# Patient Record
Sex: Female | Born: 1945 | Hispanic: No | Marital: Married | Smoking: Never smoker
Health system: Southern US, Community
[De-identification: ages and names within clinical notes are randomized; demographics above are authoritative.]

## PROBLEM LIST (undated history)

## (undated) DIAGNOSIS — N200 Calculus of kidney: Secondary | ICD-10-CM

## (undated) DIAGNOSIS — I10 Essential (primary) hypertension: Secondary | ICD-10-CM

## (undated) DIAGNOSIS — H269 Unspecified cataract: Secondary | ICD-10-CM

## (undated) DIAGNOSIS — E079 Disorder of thyroid, unspecified: Secondary | ICD-10-CM

## (undated) DIAGNOSIS — R011 Cardiac murmur, unspecified: Secondary | ICD-10-CM

## (undated) DIAGNOSIS — N189 Chronic kidney disease, unspecified: Secondary | ICD-10-CM

## (undated) HISTORY — DX: Essential (primary) hypertension: I10

## (undated) HISTORY — DX: Disorder of thyroid, unspecified: E07.9

## (undated) HISTORY — DX: Chronic kidney disease, unspecified: N18.9

## (undated) HISTORY — DX: Unspecified cataract: H26.9

## (undated) HISTORY — DX: Cardiac murmur, unspecified: R01.1

## (undated) HISTORY — PX: TUBAL LIGATION: SHX77

## (undated) HISTORY — DX: Calculus of kidney: N20.0

---

## 1997-08-14 ENCOUNTER — Other Ambulatory Visit: Admission: RE | Admit: 1997-08-14 | Discharge: 1997-08-14 | Payer: Self-pay | Admitting: Family Medicine

## 1997-08-27 ENCOUNTER — Inpatient Hospital Stay (HOSPITAL_COMMUNITY): Admission: EM | Admit: 1997-08-27 | Discharge: 1997-09-08 | Payer: Self-pay | Admitting: *Deleted

## 1997-09-03 ENCOUNTER — Encounter: Payer: Self-pay | Admitting: *Deleted

## 1997-09-26 ENCOUNTER — Encounter: Admission: RE | Admit: 1997-09-26 | Discharge: 1997-09-26 | Payer: Self-pay | Admitting: Internal Medicine

## 1998-01-17 HISTORY — PX: KIDNEY SURGERY: SHX687

## 1998-05-09 ENCOUNTER — Other Ambulatory Visit: Admission: RE | Admit: 1998-05-09 | Discharge: 1998-05-09 | Payer: Self-pay | Admitting: Obstetrics and Gynecology

## 1998-05-21 ENCOUNTER — Other Ambulatory Visit: Admission: RE | Admit: 1998-05-21 | Discharge: 1998-05-21 | Payer: Self-pay | Admitting: Obstetrics and Gynecology

## 1999-01-18 HISTORY — PX: NEPHRECTOMY: SHX65

## 1999-04-12 ENCOUNTER — Ambulatory Visit (HOSPITAL_COMMUNITY): Admission: RE | Admit: 1999-04-12 | Discharge: 1999-04-12 | Payer: Self-pay

## 1999-04-12 ENCOUNTER — Encounter (INDEPENDENT_AMBULATORY_CARE_PROVIDER_SITE_OTHER): Payer: Self-pay | Admitting: *Deleted

## 1999-05-03 ENCOUNTER — Observation Stay (HOSPITAL_COMMUNITY): Admission: RE | Admit: 1999-05-03 | Discharge: 1999-05-05 | Payer: Self-pay

## 1999-05-03 ENCOUNTER — Encounter (INDEPENDENT_AMBULATORY_CARE_PROVIDER_SITE_OTHER): Payer: Self-pay

## 2000-08-29 ENCOUNTER — Encounter: Admission: RE | Admit: 2000-08-29 | Discharge: 2000-08-29 | Payer: Self-pay | Admitting: Urology

## 2000-08-29 ENCOUNTER — Encounter: Payer: Self-pay | Admitting: Urology

## 2002-01-04 ENCOUNTER — Ambulatory Visit (HOSPITAL_COMMUNITY): Admission: RE | Admit: 2002-01-04 | Discharge: 2002-01-04 | Payer: Self-pay | Admitting: Gastroenterology

## 2002-01-04 ENCOUNTER — Encounter (INDEPENDENT_AMBULATORY_CARE_PROVIDER_SITE_OTHER): Payer: Self-pay | Admitting: Specialist

## 2002-01-17 HISTORY — PX: THYROIDECTOMY: SHX17

## 2006-03-15 ENCOUNTER — Emergency Department (HOSPITAL_COMMUNITY): Admission: EM | Admit: 2006-03-15 | Discharge: 2006-03-15 | Payer: Self-pay | Admitting: Emergency Medicine

## 2006-08-03 ENCOUNTER — Ambulatory Visit: Payer: Self-pay | Admitting: Obstetrics and Gynecology

## 2011-03-13 ENCOUNTER — Other Ambulatory Visit: Payer: Self-pay | Admitting: Family Medicine

## 2011-03-13 ENCOUNTER — Ambulatory Visit: Payer: Self-pay | Admitting: Family Medicine

## 2011-03-13 VITALS — BP 144/75 | HR 63 | Temp 98.3°F | Resp 18 | Ht 62.0 in | Wt 160.0 lb

## 2011-03-13 DIAGNOSIS — E039 Hypothyroidism, unspecified: Secondary | ICD-10-CM

## 2011-03-13 DIAGNOSIS — J45909 Unspecified asthma, uncomplicated: Secondary | ICD-10-CM

## 2011-03-13 DIAGNOSIS — I10 Essential (primary) hypertension: Secondary | ICD-10-CM

## 2011-03-13 DIAGNOSIS — Z87442 Personal history of urinary calculi: Secondary | ICD-10-CM

## 2011-03-13 MED ORDER — ALBUTEROL SULFATE HFA 108 (90 BASE) MCG/ACT IN AERS: 2.0000 | INHALATION_SPRAY | Freq: Four times a day (QID) | RESPIRATORY_TRACT | Status: DC

## 2011-03-13 MED ORDER — ATENOLOL-CHLORTHALIDONE 50-25 MG PO TABS: 1.0000 | ORAL_TABLET | Freq: Every day | ORAL | Status: DC

## 2011-03-13 MED ORDER — POTASSIUM CITRATE ER 10 MEQ (1080 MG) PO TBCR: 10.0000 meq | EXTENDED_RELEASE_TABLET | Freq: Three times a day (TID) | ORAL | Status: DC

## 2011-03-13 MED ORDER — LEVOTHYROXINE SODIUM 100 MCG PO TABS: 100.0000 ug | ORAL_TABLET | Freq: Every day | ORAL | Status: DC

## 2011-03-13 MED ORDER — HYDROCODONE-HOMATROPINE 5-1.5 MG/5ML PO SYRP: 5.0000 mL | ORAL_SOLUTION | Freq: Four times a day (QID) | ORAL | Status: AC | PRN

## 2011-03-13 MED ORDER — AMOXICILLIN 500 MG PO CAPS: 500.0000 mg | ORAL_CAPSULE | Freq: Three times a day (TID) | ORAL | Status: AC

## 2011-03-13 NOTE — Progress Notes (Signed)
  Subjective:    Patient ID: Rachel Quinn, female    DOB: November 18, 1945, 66 y.o.   MRN: [id number]  HPI    Review of Systems     Objective:   Physical Exam        Assessment & Plan:

## 2011-03-13 NOTE — Progress Notes (Signed)
Patient called back from [phone] that I had not sent her meds in.  I cannot get an answer, tried twice, double checked with answering service to be sure I had the number correct.

## 2011-03-13 NOTE — Patient Instructions (Signed)
Take medicines as ordered. Return if worse

## 2011-03-13 NOTE — Progress Notes (Signed)
Subjective: Patient is here with several things. She says when she breathes it sounds like a cat. His had a cold and a cough. She has had head congestion and postnasal drainage. She does not smoke. He does work in a Occupational psychologist.  She is also chronic medications which she is running out of over the next couple of days and needs them refilled.   Objective: No acute distress. TMs are normal. Throat was clear. Neck supple without nodes. Chest has soft expiratory wheezing bilaterally. Heart was regular without any murmurs. Abdomen soft nontender. Extremities without edema.  Assessment: Asthmatic bronchitis, hypothyroidism, history of renal dysfunction and kidney stones.  Plan: Represcribed her medications. Amoxicillin 500 3 times a day Albuterol inhaler 2 puffs 4 times a day Hycodan 1 teaspoon every 6 when necessary cough Return when necessary. Return for regular visit in 6 mo

## 2011-03-14 ENCOUNTER — Telehealth: Payer: Self-pay

## 2011-03-14 NOTE — Telephone Encounter (Signed)
Pts husband would like to know if we ment to increase the dosage on her Potassium from once a day to three times a day.

## 2011-03-15 NOTE — Telephone Encounter (Signed)
Chart pulled to nurses station 

## 2011-03-15 NOTE — Telephone Encounter (Signed)
We have no documentation of it ever increasing to TID. As far as I can tell, should be QD.  Cielle Aguila

## 2011-03-16 NOTE — Telephone Encounter (Signed)
LMOM to CB. 

## 2011-03-17 NOTE — Telephone Encounter (Signed)
Home phone number said it had been DCd. Called work number and was told pt doesn't work there. Sent unable to reach letter to pt with message from M[place]

## 2011-03-20 ENCOUNTER — Telehealth: Payer: Self-pay

## 2011-03-20 NOTE — Telephone Encounter (Signed)
Pts son called stating they got a letter that we were unable to reach them via phone about potassium. Please try to contact pt tomorrow. She also states that she vomits when she takes potassium with another mediation. Pt has to work Monday from 12-6pm and requested that we try to reach her between 9am-10am.

## 2011-03-21 NOTE — Telephone Encounter (Signed)
Pt CB stating that she was unable to take either her Amox or her cough med. They both caused vomiting, and cough med also made her too sleepy. She reports she only took two days of both. Pt states she still has cough and other Sxs and requests another Abx and cough med that may not have same SEs. Please call pt back before 11 am if possible at [phone], or call her son at [phone] and we are given permission to talk with him if can't reach her.

## 2011-03-21 NOTE — Telephone Encounter (Signed)
She was prescribed the Amoxicillin Feb 24 for her cough.  If she is vomiting she needs to RTC, we cannot call in another antibiotic.

## 2011-03-22 NOTE — Telephone Encounter (Signed)
Called pt LMOM to RTC. 

## 2012-04-30 ENCOUNTER — Ambulatory Visit (INDEPENDENT_AMBULATORY_CARE_PROVIDER_SITE_OTHER): Payer: 59 | Admitting: Family Medicine

## 2012-04-30 VITALS — BP 128/80 | HR 61 | Temp 97.9°F | Resp 16 | Ht 61.0 in | Wt 153.0 lb

## 2012-04-30 DIAGNOSIS — E890 Postprocedural hypothyroidism: Secondary | ICD-10-CM | POA: Insufficient documentation

## 2012-04-30 DIAGNOSIS — I10 Essential (primary) hypertension: Secondary | ICD-10-CM | POA: Diagnosis not present

## 2012-04-30 DIAGNOSIS — E039 Hypothyroidism, unspecified: Secondary | ICD-10-CM

## 2012-04-30 DIAGNOSIS — N189 Chronic kidney disease, unspecified: Secondary | ICD-10-CM | POA: Diagnosis not present

## 2012-04-30 DIAGNOSIS — Z87442 Personal history of urinary calculi: Secondary | ICD-10-CM

## 2012-04-30 LAB — TSH: TSH: 0.391 u[IU]/mL (ref 0.350–4.500)

## 2012-04-30 LAB — COMPREHENSIVE METABOLIC PANEL
ALT: 20 U/L (ref 0–35)
AST: 20 U/L (ref 0–37)
Albumin: 3.8 g/dL (ref 3.5–5.2)
Alkaline Phosphatase: 57 U/L (ref 39–117)
BUN: 20 mg/dL (ref 6–23)
CO2: 27 mEq/L (ref 19–32)
Calcium: 8.5 mg/dL (ref 8.4–10.5)
Chloride: 103 mEq/L (ref 96–112)
Creat: 0.88 mg/dL (ref 0.50–1.10)
Glucose, Bld: 98 mg/dL (ref 70–99)
Potassium: 3.8 mEq/L (ref 3.5–5.3)
Sodium: 146 mEq/L — ABNORMAL HIGH (ref 135–145)
Total Bilirubin: 1.1 mg/dL (ref 0.3–1.2)
Total Protein: 6.9 g/dL (ref 6.0–8.3)

## 2012-04-30 LAB — POCT CBC
Granulocyte percent: 58.8 %G (ref 37–80)
HCT, POC: 39.7 % (ref 37.7–47.9)
Hemoglobin: 12.4 g/dL (ref 12.2–16.2)
Lymph, poc: 2 (ref 0.6–3.4)
MCH, POC: 29.1 pg (ref 27–31.2)
MCHC: 31.2 g/dL — AB (ref 31.8–35.4)
MCV: 93.1 fL (ref 80–97)
MID (cbc): 0.6 (ref 0–0.9)
MPV: 9.1 fL (ref 0–99.8)
POC Granulocyte: 3.7 (ref 2–6.9)
POC LYMPH PERCENT: 31.3 %L (ref 10–50)
POC MID %: 9.9 %M (ref 0–12)
Platelet Count, POC: 197 10*3/uL (ref 142–424)
RBC: 4.26 M/uL (ref 4.04–5.48)
RDW, POC: 14.3 %
WBC: 6.3 10*3/uL (ref 4.6–10.2)

## 2012-04-30 LAB — LIPID PANEL
Cholesterol: 164 mg/dL (ref 0–200)
HDL: 37 mg/dL — ABNORMAL LOW (ref >39)
LDL Cholesterol: 108 mg/dL — ABNORMAL HIGH (ref 0–99)
Total CHOL/HDL Ratio: 4.4 Ratio
Triglycerides: 96 mg/dL (ref <150)
VLDL: 19 mg/dL (ref 0–40)

## 2012-04-30 MED ORDER — POTASSIUM CITRATE ER 10 MEQ (1080 MG) PO TBCR: 10.0000 meq | EXTENDED_RELEASE_TABLET | Freq: Three times a day (TID) | ORAL | Status: DC

## 2012-04-30 MED ORDER — LEVOTHYROXINE SODIUM 100 MCG PO TABS: 100.0000 ug | ORAL_TABLET | Freq: Every day | ORAL | Status: DC

## 2012-04-30 MED ORDER — ATENOLOL-CHLORTHALIDONE 50-25 MG PO TABS: 1.0000 | ORAL_TABLET | Freq: Every day | ORAL | Status: DC

## 2012-04-30 NOTE — Progress Notes (Signed)
  Subjective:    Patient ID: Rachel Quinn, female    DOB: 1945/12/19, 67 y.o.   MRN: [id number]  HPI  67 YO female patient with hypothyroidism needs her medication refilled. She also takes Urocit-K and needs that refilled. She has kidney issues(she reports nephrectomy secondary to stones in past) and takes Atenolol as a preventative for pressure problems. 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 She states that she had stones in her kidney years ago.   She has no any acute complaints at this time.   Review of Systems No SOB, chest pain    Objective:   Physical Exam no acute HEENT: Unremarkable Chest: Clear Heart: Regular, no murmur heard, no rub or gallop Abdomen: Soft nontender without HSM Extremities: No edema Skin: No rash     Assessment & Plan:  Stable on current medications  HTN (hypertension), benign - Plan: Comprehensive metabolic panel, Lipid panel, atenolol-chlorthalidone (TENORETIC) 50-25 MG per tablet  Personal history of kidney stones - Plan: POCT CBC, potassium citrate (UROCIT-K) 10 MEQ (1080 MG) SR tablet  Hypothyroid - Plan: TSH  Chronic kidney disease  Hypothyroidism, postsurgical  Hypertension

## 2012-05-02 ENCOUNTER — Encounter: Payer: Self-pay | Admitting: Family Medicine

## 2012-05-10 ENCOUNTER — Telehealth: Payer: Self-pay

## 2012-05-10 NOTE — Telephone Encounter (Signed)
Patient calling us about lab results, she got a letter in the mail for her to call us [phone]

## 2012-05-10 NOTE — Telephone Encounter (Signed)
Called her, advised labs normal

## 2012-08-11 ENCOUNTER — Ambulatory Visit: Payer: Medicare PPO

## 2012-08-11 ENCOUNTER — Ambulatory Visit (INDEPENDENT_AMBULATORY_CARE_PROVIDER_SITE_OTHER): Payer: 59 | Admitting: Family Medicine

## 2012-08-11 VITALS — BP 142/88 | HR 78 | Temp 97.9°F | Resp 16 | Ht 60.5 in | Wt 149.0 lb

## 2012-08-11 DIAGNOSIS — S239XXA Sprain of unspecified parts of thorax, initial encounter: Secondary | ICD-10-CM

## 2012-08-11 DIAGNOSIS — R0781 Pleurodynia: Secondary | ICD-10-CM

## 2012-08-11 DIAGNOSIS — S22000A Wedge compression fracture of unspecified thoracic vertebra, initial encounter for closed fracture: Secondary | ICD-10-CM

## 2012-08-11 DIAGNOSIS — M546 Pain in thoracic spine: Secondary | ICD-10-CM

## 2012-08-11 DIAGNOSIS — R079 Chest pain, unspecified: Secondary | ICD-10-CM

## 2012-08-11 MED ORDER — TRAMADOL HCL 50 MG PO TABS: 50.0000 mg | ORAL_TABLET | Freq: Three times a day (TID) | ORAL | Status: DC | PRN

## 2012-08-11 NOTE — Progress Notes (Signed)
[address]   [place], [place]  [address]   [phone]  Subjective:    Patient ID: Rachel Quinn, female    DOB: 1945/02/10, 67 y.o.   MRN: [id number]  HPI This 67 y.o. female presents for evaluation of L lateral chest/rib pain. Onset of L lateral rib pain and L thoracic back pain after lifting infant at work (26 months old) and after vacuuming at work.  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  Hurts in afternoons.  Onset five days ago.  Bending makes worse.  Worried about broken rib.  No medications for pain.  +pain with deep breathing intermittently.  Review of Systems  Constitutional: Negative for fever, chills, diaphoresis and fatigue.  Respiratory: Negative for cough, shortness of breath, wheezing and stridor.   Cardiovascular: Negative for chest pain, palpitations and leg swelling.  Gastrointestinal: Negative for nausea, vomiting and abdominal pain.  Genitourinary: Negative for hematuria.  Musculoskeletal: Positive for myalgias and back pain.  Skin: Negative for rash.    Past Medical History  Diagnosis Date  . Heart murmur   . Chronic kidney disease   . Thyroid disease     Past Surgical History  Procedure Laterality Date  . Tubal ligation    . Thyroidectomy  01/17/2002  . Nephrectomy  01/18/1999    nephrolithiasis    Prior to Admission medications   Medication Sig Start Date End Date Taking? Authorizing Provider  atenolol-chlorthalidone (TENORETIC) 50-25 MG per tablet Take 1 tablet by mouth daily. 04/30/12  Yes Elvina Sidle, MD  levothyroxine (SYNTHROID, LEVOTHROID) 100 MCG tablet Take 1 tablet (100 mcg total) by mouth daily. 04/30/12 04/30/13 Yes Elvina Sidle, MD  potassium citrate (UROCIT-K) 10 MEQ (1080 MG) SR tablet Take 1 tablet (10 mEq total) by mouth 3 (three) times daily with meals. 04/30/12   Elvina Sidle, MD    No Known Allergies  History   Social History  . Marital Status: Married    Spouse Name:  N/A    Number of Children: N/A  . Years of Education: N/A   Occupational History  . Not on file.   Social History Main Topics  . Smoking status: Never Smoker   . Smokeless tobacco: Not on file  . Alcohol Use: No  . Drug Use: No  . Sexually Active: No   Other Topics Concern  . Not on file   Social History Narrative  . No narrative on file    No family history on file.     Objective:   Physical Exam  Nursing note and vitals reviewed. Constitutional: She is oriented to person, place, and time. She appears well-developed and well-nourished. No distress.  HENT:  Head: Normocephalic and atraumatic.  Cardiovascular: Normal rate, regular rhythm and normal heart sounds.   Pulmonary/Chest: Effort normal and breath sounds normal.  Abdominal: Soft. Bowel sounds are normal. She exhibits no distension. There is no tenderness. There is no rebound and no guarding.  Neurological: She is alert and oriented to person, place, and time.  Skin: Skin is warm and dry. No rash noted. She is not diaphoretic.  Psychiatric: She has a normal mood and affect. Her behavior is normal. Thought content normal.    [facility] reading (PRIMARY) by  Dr. Katrinka Blazing.  THORACIC SPINE:  +COMPRESSION FRACTURE T12.  L RIBS: NAD.      Assessment & Plan:  Thoracic back pain - Plan: DG Thoracic Spine 2 View, traMADol (ULTRAM)  50 MG tablet  Rib pain on left side - Plan: DG Ribs Unilateral W/Chest Left  Thoracic back sprain, initial encounter  Compression fracture of thoracic vertebra, closed, initial encounter   1. Thoracic back pain L:  New.  Onset after lifting children at work.  Good range of motion. Will avoid NSAIDs with single kidney; rx for Tramadol provided. Avoid heavy lifting for the next two weeks; note for work provided for light duty. 2. Rib pain L:  New. Rib films negative. Recommend rest, ice, avoid heavy lifting. 3.  Thoracic back sprain L; New.  Rest, heat or ice, avoid lifting. 4. Compression fracture  Thoracic spine: Detected by xray; excellent range of motion with mild to moderate pain; do not feel compression fracture etiology to current symptoms.  Warrants bone density scan if has not been performed in past two years.  Meds ordered this encounter  Medications  . traMADol (ULTRAM) 50 MG tablet    Sig: Take 1 tablet (50 mg total) by mouth every 8 (eight) hours as needed for pain.    Dispense:  40 tablet    Refill:  0

## 2012-08-11 NOTE — Patient Instructions (Signed)
1.  TAKE TYLENOL THREE TIMES DAILY AS NEEDED FOR PAIN IN RIBS.

## 2013-01-10 ENCOUNTER — Ambulatory Visit (INDEPENDENT_AMBULATORY_CARE_PROVIDER_SITE_OTHER): Payer: Medicare PPO | Admitting: Family Medicine

## 2013-01-10 ENCOUNTER — Ambulatory Visit: Payer: Medicare PPO

## 2013-01-10 VITALS — BP 145/85 | HR 56 | Temp 98.6°F | Resp 18

## 2013-01-10 DIAGNOSIS — M25569 Pain in unspecified knee: Secondary | ICD-10-CM

## 2013-01-10 DIAGNOSIS — M25562 Pain in left knee: Secondary | ICD-10-CM

## 2013-01-10 DIAGNOSIS — M129 Arthropathy, unspecified: Secondary | ICD-10-CM

## 2013-01-10 DIAGNOSIS — M546 Pain in thoracic spine: Secondary | ICD-10-CM

## 2013-01-10 LAB — POCT CBC
Granulocyte percent: 60.4 % (ref 37–80)
HCT, POC: 44.4 % (ref 37.7–47.9)
Hemoglobin: 13.8 g/dL (ref 12.2–16.2)
Lymph, poc: 2 (ref 0.6–3.4)
MCH, POC: 30.3 pg (ref 27–31.2)
MCHC: 31.1 g/dL — AB (ref 31.8–35.4)
MCV: 97.5 fL — AB (ref 80–97)
MID (cbc): 0.4 (ref 0–0.9)
MPV: 8.7 fL (ref 0–99.8)
POC Granulocyte: 3.6 (ref 2–6.9)
POC LYMPH PERCENT: 32.8 %L (ref 10–50)
POC MID %: 6.8 %M (ref 0–12)
Platelet Count, POC: 202 10*3/uL (ref 142–424)
RBC: 4.55 M/uL (ref 4.04–5.48)
RDW, POC: 13.8 %
WBC: 6 10*3/uL (ref 4.6–10.2)

## 2013-01-10 MED ORDER — TRAMADOL HCL 50 MG PO TABS: 50.0000 mg | ORAL_TABLET | Freq: Three times a day (TID) | ORAL | Status: DC | PRN

## 2013-01-10 NOTE — Progress Notes (Deleted)
Subjective:    Patient ID: Rachel Quinn, female    DOB: 10-26-1945, 67 y.o.   MRN: [id number]  HPI  Pt presents with cough that is non-productive that started one week ago. She denies fever or chills. She works in a daycare.   Pt is having left knee that started a week ago. Tramadol helps sometimes. Her pain is more prominent when walking.      Chief Complaint:  Chief Complaint  Patient presents with  . Knee Pain    left  . Cough    HPI: Rachel Quinn is a 67 y.o. female who is here for  Left knee pain, NKI, has a hsitroy of arthritis.  THis is the first time she has had this, deep pain, can't stand right away. Nonweightbearing.  No radiation. + numbness or tinlgin chronic, intermittnet.  Deneis diabetes.   She has had a 1 week cough history, intermittent yelllow green, no fevers or chills. No fevers or chills.    Can happen anythie No SOB or wheezing.  No asthma or allergoes.  She works in Audiological scientist, she works in Audiological scientist.  No throat pain She got the flu vaccine tis year. OCt 2014/   Past Medical History  Diagnosis Date  . Heart murmur   . Chronic kidney disease   . Thyroid disease    Past Surgical History  Procedure Laterality Date  . Tubal ligation    . Thyroidectomy  01/17/2002  . Nephrectomy  01/18/1999    nephrolithiasis   History   Social History  . Marital Status: Married    Spouse Name: N/A    Number of Children: N/A  . Years of Education: N/A   Social History Main Topics  . Smoking status: Never Smoker   . Smokeless tobacco: None  . Alcohol Use: No  . Drug Use: No  . Sexual Activity: No   Other Topics Concern  . None   Social History Narrative  . None   No family history on file. No Known Allergies Prior to Admission medications   Medication Sig Start Date End Date Taking? Authorizing Provider  atenolol-chlorthalidone (TENORETIC) 50-25 MG per tablet Take 1 tablet by mouth daily. 04/30/12  Yes Elvina Sidle, MD    levothyroxine (SYNTHROID, LEVOTHROID) 100 MCG tablet Take 1 tablet (100 mcg total) by mouth daily. 04/30/12 04/30/13 Yes Elvina Sidle, MD  potassium citrate (UROCIT-K) 10 MEQ (1080 MG) SR tablet Take 1 tablet (10 mEq total) by mouth 3 (three) times daily with meals. 04/30/12  Yes Elvina Sidle, MD  traMADol (ULTRAM) 50 MG tablet Take 1 tablet (50 mg total) by mouth every 8 (eight) hours as needed for pain. 08/11/12  Yes Ethelda Chick, MD     ROS: The patient denies fevers, chills, night sweats, unintentional weight loss, chest pain, palpitations, wheezing, dyspnea on exertion, nausea, vomiting, abdominal pain, dysuria, hematuria, melena, numbness, weakness, or tingling. ***  All other systems have been reviewed and were otherwise negative with the exception of those mentioned in the HPI and as above.    PHYSICAL EXAM: Filed Vitals:   01/10/13 1540  BP: 145/85  Pulse: 56  Temp: 98.6 F (37 C)  Resp: 18   There were no vitals filed for this visit. There is no weight on file to calculate BMI.  General: Alert, no acute distress HEENT:  Normocephalic, atraumatic, oropharynx patent. EOMI, PERRLA Cardiovascular:  Regular rate and rhythm, no rubs murmurs or gallops.  No Carotid bruits, radial pulse  intact. No pedal edema.  Respiratory: Clear to auscultation bilaterally.  No wheezes, rales, or rhonchi.  No cyanosis, no use of accessory musculature GI: No organomegaly, abdomen is soft and non-tender, positive bowel sounds.  No masses. Skin: No rashes. Neurologic: Facial musculature symmetric. Psychiatric: Patient is appropriate throughout our interaction. Lymphatic: No cervical lymphadenopathy Musculoskeletal: Gait intact.   LABS: Results for orders placed in visit on 04/30/12  COMPREHENSIVE METABOLIC PANEL      Result Value Range   Sodium 146 (*) 135 - 145 mEq/L   Potassium 3.8  3.5 - 5.3 mEq/L   Chloride 103  96 - 112 mEq/L   CO2 27  19 - 32 mEq/L   Glucose, Bld 98  70 - 99  mg/dL   BUN 20  6 - 23 mg/dL   Creat 1.61  0.96 - 0.45 mg/dL   Total Bilirubin 1.1  0.3 - 1.2 mg/dL   Alkaline Phosphatase 57  39 - 117 U/L   AST 20  0 - 37 U/L   ALT 20  0 - 35 U/L   Total Protein 6.9  6.0 - 8.3 g/dL   Albumin 3.8  3.5 - 5.2 g/dL   Calcium 8.5  8.4 - 40.9 mg/dL  LIPID PANEL      Result Value Range   Cholesterol 164  0 - 200 mg/dL   Triglycerides 96  <811 mg/dL   HDL 37 (*) >91 mg/dL   Total CHOL/HDL Ratio 4.4     VLDL 19  0 - 40 mg/dL   LDL Cholesterol 478 (*) 0 - 99 mg/dL  TSH      Result Value Range   TSH 0.391  0.350 - 4.500 uIU/mL  POCT CBC      Result Value Range   WBC 6.3  4.6 - 10.2 K/uL   Lymph, poc 2.0  0.6 - 3.4   POC LYMPH PERCENT 31.3  10 - 50 %L   MID (cbc) 0.6  0 - 0.9   POC MID % 9.9  0 - 12 %M   POC Granulocyte 3.7  2 - 6.9   Granulocyte percent 58.8  37 - 80 %G   RBC 4.26  4.04 - 5.48 M/uL   Hemoglobin 12.4  12.2 - 16.2 g/dL   HCT, POC 29.5  62.1 - 47.9 %   MCV 93.1  80 - 97 fL   MCH, POC 29.1  27 - 31.2 pg   MCHC 31.2 (*) 31.8 - 35.4 g/dL   RDW, POC 30.8     Platelet Count, POC 197  142 - 424 K/uL   MPV 9.1  0 - 99.8 fL     EKG/XRAY:   Primary read interpreted by Dr. Conley Rolls at [facility]. NO obvious fractures or dislocation Please comment on scattered lucency on distal femur   ASSESSMENT/PLAN: Encounter Diagnosis  Name Primary?  . Knee pain, left Yes     Gross sideeffects, risk and benefits, and alternatives of medications d/w patient. Patient is aware that all medications have potential sideeffects and we are unable to predict every sideeffect or drug-drug interaction that may occur.  Rockne Coons, DO 01/10/2013 5:44 PM       Review of Systems     Objective:   Physical Exam        Assessment & Plan:

## 2013-01-16 NOTE — Progress Notes (Signed)
Chief Complaint:  Chief Complaint  Patient presents with  . Knee Pain    left  . Cough    HPI: Rachel Quinn is a 67 y.o. female who is here for a 2 day hsitory  Left knee pain, NKI, has a hsitroy of arthritis.  THis is the first time she has had this, deep pain, can't stand right away. Nonweightbearing.  No radiation. + numbness or tinlgin chronic, intermittnet.  Deneis diabetes. 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   She has had a 1 week cough history, intermittent yelllow green, no fevers or chills. No fevers or chills.  Can happen anytime, No SOB or wheezing.  No asthma or allergies.  She works in daycare  No throat pain She got the flu vaccine tis year. OCt 2014   Past Medical History  Diagnosis Date  . Heart murmur   . Thyroid disease   . Chronic kidney disease    Past Surgical History  Procedure Laterality Date  . Tubal ligation    . Thyroidectomy  01/17/2002  . Nephrectomy  01/18/1999    nephrolithiasis   History   Social History  . Marital Status: Married    Spouse Name: N/A    Number of Children: N/A  . Years of Education: N/A   Social History Main Topics  . Smoking status: Never Smoker   . Smokeless tobacco: None  . Alcohol Use: No  . Drug Use: No  . Sexual Activity: No   Other Topics Concern  . None   Social History Narrative  . None   No family history on file. No Known Allergies Prior to Admission medications   Medication Sig Start Date End Date Taking? Authorizing Provider  atenolol-chlorthalidone (TENORETIC) 50-25 MG per tablet Take 1 tablet by mouth daily. 04/30/12  Yes Elvina Sidle, MD  levothyroxine (SYNTHROID, LEVOTHROID) 100 MCG tablet Take 1 tablet (100 mcg total) by mouth daily. 04/30/12 04/30/13 Yes Elvina Sidle, MD  potassium citrate (UROCIT-K) 10 MEQ (1080 MG) SR tablet Take 1 tablet (10 mEq total) by mouth 3 (three) times daily with meals. 04/30/12  Yes Elvina Sidle, MD  traMADol  (ULTRAM) 50 MG tablet Take 1 tablet (50 mg total) by mouth every 8 (eight) hours as needed. 01/10/13  Yes Cordelle Dahmen P Lavaughn Haberle, DO     ROS: The patient denies fevers, chills, night sweats, unintentional weight loss, chest pain, palpitations, wheezing, dyspnea on exertion, nausea, vomiting, abdominal pain, dysuria, hematuria, melena, numbness, weakness, or tingling.   All other systems have been reviewed and were otherwise negative with the exception of those mentioned in the HPI and as above.    PHYSICAL EXAM: Filed Vitals:   01/10/13 1540  BP: 145/85  Pulse: 56  Temp: 98.6 F (37 C)  Resp: 18   There were no vitals filed for this visit. There is no weight on file to calculate BMI.  General: Alert, no acute distress HEENT:  Normocephalic, atraumatic, oropharynx patent. EOMI, PERRLA, no exudates, no erythema, ? PND, non tender sinuses, TM nl Cardiovascular:  Regular rate and rhythm, no rubs murmurs or gallops.  No Carotid bruits, radial pulse intact. No pedal edema.  Respiratory: Clear to auscultation bilaterally.  No wheezes, rales, or rhonchi.  No cyanosis, no use of accessory musculature GI: No organomegaly, abdomen is soft and non-tender, positive bowel sounds.  No masses. Skin: No rashes. Neurologic: Facial musculature symmetric. Psychiatric: Patient is appropriate throughout our interaction.  Lymphatic: No cervical lymphadenopathy Musculoskeletal: IN wheelchair, after she was given hinged knee brace she was able to walk out with slight limp but was weightbearing and felt better.  Right knee-minimal to no effusion, no deformity Neg varus or valgus stress, neg McMurray or jt kine, neg Lahcman She had diffuse anterior knee pain radiating to posteior no obviosu politeal cyst Full ROM but painful with full extension and flexion 5/5 strength, sensation intact   LABS: Results for orders placed in visit on 01/10/13  POCT CBC      Result Value Range   WBC 6.0  4.6 - 10.2 K/uL   Lymph, poc  2.0  0.6 - 3.4   POC LYMPH PERCENT 32.8  10 - 50 %L   MID (cbc) 0.4  0 - 0.9   POC MID % 6.8  0 - 12 %M   POC Granulocyte 3.6  2 - 6.9   Granulocyte percent 60.4  37 - 80 %G   RBC 4.55  4.04 - 5.48 M/uL   Hemoglobin 13.8  12.2 - 16.2 g/dL   HCT, POC 45.4  09.8 - 47.9 %   MCV 97.5 (*) 80 - 97 fL   MCH, POC 30.3  27 - 31.2 pg   MCHC 31.1 (*) 31.8 - 35.4 g/dL   RDW, POC 11.9     Platelet Count, POC 202  142 - 424 K/uL   MPV 8.7  0 - 99.8 fL     EKG/XRAY:   Primary read interpreted by Dr. Conley Rolls at [facility]. No fx/dislocation   ASSESSMENT/PLAN: Encounter Diagnoses  Name Primary?  . Knee pain, left Yes  . Thoracic back pain    Possible arthritic flare with minimal effusion Improved with hinge knee brace Continue with hinge knee brace and tramadol prn Cough sxs possibly viral PND, she has no other sxs except for it , CBC showed no luekocytosis, otc cough or salt water gargles F/u prn    Gross sideeffects, risk and benefits, and alternatives of medications d/w patient. Patient is aware that all medications have potential sideeffects and we are unable to predict every sideeffect or drug-drug interaction that may occur.  [name], DO 01/16/2013 12:12 PM

## 2013-01-18 ENCOUNTER — Ambulatory Visit: Payer: Medicare PPO

## 2013-01-18 ENCOUNTER — Ambulatory Visit (INDEPENDENT_AMBULATORY_CARE_PROVIDER_SITE_OTHER): Payer: 59 | Admitting: Family Medicine

## 2013-01-18 VITALS — BP 120/74 | HR 72 | Temp 98.4°F | Resp 18 | Ht 61.5 in | Wt 149.0 lb

## 2013-01-18 DIAGNOSIS — J209 Acute bronchitis, unspecified: Secondary | ICD-10-CM

## 2013-01-18 DIAGNOSIS — R059 Cough, unspecified: Secondary | ICD-10-CM | POA: Diagnosis not present

## 2013-01-18 DIAGNOSIS — Z5189 Encounter for other specified aftercare: Secondary | ICD-10-CM | POA: Diagnosis not present

## 2013-01-18 DIAGNOSIS — D1622 Benign neoplasm of long bones of left lower limb: Secondary | ICD-10-CM

## 2013-01-18 DIAGNOSIS — R05 Cough: Secondary | ICD-10-CM

## 2013-01-18 DIAGNOSIS — R6889 Other general symptoms and signs: Secondary | ICD-10-CM | POA: Diagnosis not present

## 2013-01-18 DIAGNOSIS — S8392XD Sprain of unspecified site of left knee, subsequent encounter: Secondary | ICD-10-CM

## 2013-01-18 LAB — POCT INFLUENZA A/B
INFLUENZA B, POC: NEGATIVE
Influenza A, POC: NEGATIVE

## 2013-01-18 MED ORDER — AZITHROMYCIN 250 MG PO TABS: ORAL_TABLET | ORAL | Status: DC

## 2013-01-18 MED ORDER — IPRATROPIUM BROMIDE 0.03 % NA SOLN: 2.0000 | Freq: Two times a day (BID) | NASAL | Status: DC

## 2013-01-18 NOTE — Progress Notes (Signed)
This chart was scribed for Wardell Honour, MD by Einar Pheasant, ED Scribe. This patient was seen in room 3 and the patient's care was started at 4:22 PM. Subjective:    Patient ID: Rachel Quinn, female    DOB: 07-20-1945, 68 y.o.   MRN: [id number]  Chief Complaint  Patient presents with  . Fever    x1 week   . Cough  . Knee Pain    left-pain continues   . Headache    x3 days     HPI HPI Comments: Presentaci Rachel Quinn is a 68 y.o. female who presents to [facility] complaining of a fever that started 1 week ago. Pt states that she chose to drink a lot of water instead of taking tylenol. When she came in today she was told that her fever had reduced. However, she is also complaining of an associated cough and a headache that started 3 days ago. She reports taking Robitussin to relieve her cough symptoms with minimal to no relief. Pt is complaining of chills and rhinorrhea. She denies SOB, emesis, diarrhea.   Secondary to her chief complaint she is also complaining of persistent left knee pain. She states that her knee pain is exacerbated by rapid walking.  She was evaluated one week ago by Dr. Marin Comment; underwent knee xray that revealed cystic changes. Knee pain has improved. No giving out; no popping; no swelling.  No pain at rest or with slow walking.  Wants to know more about etiology of knee pain.  Past Medical History  Diagnosis Date  . Heart murmur   . Thyroid disease   . Chronic kidney disease   . Hypertension    No Known Allergies Current Outpatient Prescriptions on File Prior to Visit  Medication Sig Dispense Refill  . atenolol-chlorthalidone (TENORETIC) 50-25 MG per tablet Take 1 tablet by mouth daily.  90 tablet  3  . levothyroxine (SYNTHROID, LEVOTHROID) 100 MCG tablet Take 1 tablet (100 mcg total) by mouth daily.  90 tablet  3  . potassium citrate (UROCIT-K) 10 MEQ (1080 MG) SR tablet Take 1 tablet (10 mEq total) by mouth 3 (three) times daily with  meals.  90 tablet  3  . traMADol (ULTRAM) 50 MG tablet Take 1 tablet (50 mg total) by mouth every 8 (eight) hours as needed.  40 tablet  0   No current facility-administered medications on file prior to visit.    Review of Systems  Constitutional: Positive for fever and chills.  HENT: Positive for congestion and rhinorrhea. Negative for ear pain, sore throat and trouble swallowing.   Respiratory: Positive for cough and wheezing. Negative for shortness of breath and stridor.   Gastrointestinal: Negative for nausea, vomiting and abdominal pain.  Musculoskeletal: Positive for arthralgias (chornic left knee pain) and gait problem. Negative for joint swelling.      Triage vitals: BP 120/74  Pulse 72  Temp(Src) 98.4 F (36.9 C) (Oral)  Resp 18  Ht 5' 1.5" (1.562 m)  Wt 149 lb (67.586 kg)  BMI 27.70 kg/m2  SpO2 97% Objective:   Physical Exam  Nursing note and vitals reviewed. Constitutional: She is oriented to person, place, and time. She appears well-developed and well-nourished. No distress.  HENT:  Head: Normocephalic and atraumatic.  Right Ear: External ear normal.  Left Ear: External ear normal.  Mouth/Throat: No oropharyngeal exudate.  Nasal congestion. Throat is normal but has drainage in the back of throat.   Eyes: Conjunctivae and  EOM are normal. Pupils are equal, round, and reactive to light.  Neck: Neck supple. No tracheal deviation present.  Cardiovascular: Normal rate and regular rhythm.   Murmur heard.  Systolic murmur is present with a grade of 6/6  Pulmonary/Chest: Effort normal and breath sounds normal. No respiratory distress. She has no decreased breath sounds. She has no wheezes. She has no rhonchi. She has no rales.  Musculoskeletal: Normal range of motion.       Left knee: She exhibits normal range of motion, no swelling, no effusion and normal meniscus. Tenderness found. Patellar tendon tenderness noted. No medial joint line, no lateral joint line, no MCL and  no LCL tenderness noted.  Non tender to palpation. McMurray's is negative. No tenderness to palpation over patellar region.  Lymphadenopathy:    She has no cervical adenopathy.  Neurological: She is alert and oriented to person, place, and time.  Skin: Skin is warm and dry.  Psychiatric: She has a normal mood and affect. Her behavior is normal.   Results for orders placed in visit on 01/18/13  POCT INFLUENZA A/B      Result Value Range   Influenza A, POC Negative     Influenza B, POC Negative     [facility] reading (PRIMARY) by  Dr. Tamala Julian.  CXR: NAD     Assessment & Plan:   1. Cough   2. Flu-like symptoms   3. Sprain, knee, left, subsequent encounter   4. Acute bronchitis   5. Enchondroma of femur, left    1. Acute bronchitis versus URI:  Persistent with associated fever; treat with Zpack, Atrovent nasal spray; continue OTC cough medications. 2.  Rachel knee sprain:  Improving.  Recommend ice qhs for 15 minutes.  Recommend rest, icing. 3.  Enchondroma femur Rachel:  New.  Recommend repeat Rachel femur film in six months to confirm stability of lesion.  Meds ordered this encounter  Medications  . azithromycin (ZITHROMAX) 250 MG tablet    Sig: Take 2 tabs PO x 1 dose, then 1 tab PO QD x 4 days    Dispense:  6 tablet    Refill:  0  . ipratropium (ATROVENT) 0.03 % nasal spray    Sig: Place 2 sprays into the nose 2 (two) times daily.    Dispense:  30 mL    Refill:  0    I personally performed the services described in this documentation, which was scribed in my presence.  The recorded information has been reviewed and is accurate.  Reginia Forts, M.D.  [facility] [address] [place], [place]  [address] [phone] phone [phone] fax

## 2013-01-28 NOTE — Progress Notes (Signed)
Left message for patient to call back regarding scheduling appointment.

## 2013-03-13 ENCOUNTER — Other Ambulatory Visit: Payer: Self-pay | Admitting: Family Medicine

## 2013-05-12 ENCOUNTER — Other Ambulatory Visit: Payer: Self-pay | Admitting: Family Medicine

## 2013-06-01 ENCOUNTER — Ambulatory Visit: Payer: Medicare PPO

## 2013-06-01 ENCOUNTER — Ambulatory Visit (INDEPENDENT_AMBULATORY_CARE_PROVIDER_SITE_OTHER): Payer: 59 | Admitting: Family Medicine

## 2013-06-01 VITALS — BP 136/80 | HR 60 | Temp 98.2°F | Resp 18 | Ht 60.5 in | Wt 147.0 lb

## 2013-06-01 DIAGNOSIS — I10 Essential (primary) hypertension: Secondary | ICD-10-CM | POA: Diagnosis not present

## 2013-06-01 DIAGNOSIS — M25562 Pain in left knee: Secondary | ICD-10-CM

## 2013-06-01 DIAGNOSIS — M25569 Pain in unspecified knee: Secondary | ICD-10-CM

## 2013-06-01 DIAGNOSIS — R05 Cough: Secondary | ICD-10-CM | POA: Diagnosis not present

## 2013-06-01 DIAGNOSIS — E039 Hypothyroidism, unspecified: Secondary | ICD-10-CM

## 2013-06-01 DIAGNOSIS — R059 Cough, unspecified: Secondary | ICD-10-CM

## 2013-06-01 MED ORDER — LEVOTHYROXINE SODIUM 100 MCG PO TABS: ORAL_TABLET | ORAL | Status: DC

## 2013-06-01 MED ORDER — ATENOLOL-CHLORTHALIDONE 50-25 MG PO TABS: 0.5000 | ORAL_TABLET | Freq: Every day | ORAL | Status: DC

## 2013-06-01 NOTE — Progress Notes (Addendum)
Subjective:  This chart was scribed for [employer]. Carlota Raspberry  by Stacy Gardner, [facility] Scribe. The patient was seen in room and the patient's care was started at 7:57 PM.    Patient ID: Micheal Likens, female    DOB: October 09, 1945, 68 y.o.   MRN: [id number]  Cough Associated symptoms include myalgias and rhinorrhea. Pertinent negatives include no chest pain, fever or shortness of breath.   HPI Comments: Presentaci L Barner is a 68 y.o. female who arrives to the [facility] complaining of cough, onset five days.  She has associated mild rhinorrhea. Denies taking any medication for her symptoms.  Denies fever. 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 She has a past hx of pneumonia. In the past she took  antibiotics and Robitussin for treatment of the pneumonia.    Pt also requests medication refills of blood pressure medication, leg pain, hypothyroidism.  Pt complains of unchanged left knee pain. Pt saw Dr. Marin Comment for L knee pain 12/2012. She was dx with arthritis in the left knee. The pain is mostly located behind the knee and on top of the knee. The pain is worse with she bends her knees or lift bears weight. She has swelling of her left knee.   Pt was seen by Dr. Tamala Julian 01/2013.  The pain remains the same. She is taking otc pain reliever. 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   Pt has a hx of hypothyroidism. She was seen 04/2012 and had a normal TSH at that time. She takes Synthroid 100 mg QD .   Pt has hx of HTN. She takes Tenoritic 50/25 mg QD. Her last laboratory work was 04/2012 and her total cholesterol was normal. Pt electorolytes are ok. Her potassium level was 3.8. She takes Potassium 10 MEQ once a day. Pt reports she is no longer taking her BP medication daily and takes it about once a week. Pt is taking her thyroid medication daily.  Pt has hx of kidney stones and  had a nephrectomy, now with solitary kidney.  Denies lighteadedness, SOB, dizziness, or chest pain.   Pt is from the [place].  Dr. Joseph Art is her PCP.   Patient Active Problem List   Diagnosis Date Noted  . 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 Hypertension 04/30/2012   Past Medical History  Diagnosis Date  . Heart murmur   . Thyroid disease   . Chronic kidney disease   . Hypertension    Past Surgical History  Procedure Laterality Date  . Tubal ligation    . Thyroidectomy  01/17/2002  . Nephrectomy  01/18/1999    nephrolithiasis   No Known Allergies Prior to Admission medications   Medication Sig Start Date End Date Taking? Authorizing Provider  atenolol-chlorthalidone (TENORETIC) 50-25 MG per tablet Take 1 tablet by mouth daily. PATIENT NEEDS OFFICE VISIT FOR ADDITIONAL REFILLS   Yes Robyn Haber, MD  ipratropium (ATROVENT) 0.03 % nasal spray Place 2 sprays into the nose 2 (two) times daily. 01/18/13  Yes Wardell Honour, MD  levothyroxine (SYNTHROID, LEVOTHROID) 100 MCG tablet TAKE ONE TABLET BY MOUTH ONCE DAILY 03/13/13  Yes Ryan M Dunn, PA-C  potassium citrate (UROCIT-K) 10 MEQ (1080 MG) SR tablet Take 1 tablet (10 mEq total) by mouth 3 (three) times daily with meals. PATIENT NEEDS OFFICE VISIT FOR ADDITIONAL REFILLS   Yes Robyn Haber, MD  traMADol (ULTRAM) 50 MG tablet Take 1 tablet (50 mg total) by mouth every 8 (eight) hours as needed. 01/10/13  Yes Thao P Le, DO   History   Social History  . Marital Status: Married    Spouse Name: N/A    Number of Children: N/A  . Years of Education: N/A   Occupational History  . Not on file.   Social History Main Topics  . Smoking status: Never Smoker   . Smokeless tobacco: Not on file  . Alcohol Use: No  . Drug Use: No  . Sexual Activity: No   Other Topics Concern  . Not on file   Social History Narrative  . No narrative on file     Review of Systems  Constitutional: Negative for fever.  HENT: Positive  for rhinorrhea.   Respiratory: Positive for cough. Negative for shortness of breath.   Cardiovascular: Negative for chest pain.  Musculoskeletal: Positive for arthralgias, gait problem, joint swelling and myalgias.  Neurological: Negative for dizziness and light-headedness.       Objective:   Physical Exam  Nursing note and vitals reviewed. Constitutional: She is oriented to person, place, and time. She appears well-developed and well-nourished. No distress.  HENT:  Head: Normocephalic and atraumatic.  Right Ear: Hearing, tympanic membrane, external ear and ear canal normal.  Left Ear: Hearing, tympanic membrane, external ear and ear canal normal.  Nose: Nose normal.  Mouth/Throat: Oropharynx is clear and moist. No oropharyngeal exudate.  Eyes: Conjunctivae and EOM are normal. Pupils are equal, round, and reactive to light.  Neck: Carotid bruit is not present.  Cardiovascular: Normal rate, regular rhythm, normal heart sounds and intact distal pulses.   No murmur heard. Pulmonary/Chest: Effort normal and breath sounds normal. No respiratory distress. She has no wheezes. She has no rhonchi.  Abdominal: Soft. She exhibits no pulsatile midline mass. There is no tenderness.  Musculoskeletal: She exhibits edema and tenderness.  Tenderness medial greater than lateral L knee.  Pt has full extension.  Trace effusion of left knee.  Patella tenderness with slight pain with patellar compression With McMurray's she has pain to the lateral joint line.    Neurological: She is alert and oriented to person, place, and time.  Skin: Skin is warm and dry. No rash noted.  Skin intact. No erythema.   Psychiatric: She has a normal mood and affect. Her behavior is normal.     Filed Vitals:   06/01/13 1702  BP: 136/80  Pulse: 60  Temp: 98.2 F (36.8 C)  TempSrc: Oral  Resp: 18  Height: 5' 0.5" (1.537 m)  Weight: 147 lb (66.679 kg)  SpO2: 98%   [facility] reading (PRIMARY) by  Dr. Rogelio Seen  Knee -  persistent abnormality in distal femur metaphysis. No apparent change. djd in joint line.      Assessment & Plan:   Presentaci L Cosner is a 68 y.o. female HTN (hypertension) - Plan: atenolol-chlorthalidone (TENORETIC) 50-25 MG per tablet, COMPLETE METABOLIC PANEL WITH GFR, Lipid panel, TSH - discussed improtance of med compliance, especially with solitary kidney. Has not taken med today.  Will decrease dose to 1//2 of tenoretic as may not need this high of a dose, but to check home BP's.   Unspecified hypothyroidism - Plan: levothyroxine (SYNTHROID, LEVOTHROID) 100 MCG tablet, COMPLETE METABOLIC PANEL WITH GFR, Lipid panel, TSH pending. continue same dose synthroid at this point.   Cough -  lungs clear, afebrile. Reassuring exam. Suspected viral syndrome at this point with  multiple sick contacts.   Left knee pain - Plan: DG Knee Complete 4 Views Left - persistent abnormality at distal femur, but pain appears to be more joint line typical of OA/degenerative dz.  Will refer to ortho for this pain and for discussion of abnormality in femur. Sx care with tylenol discussed. rtc precautions.    Meds ordered this encounter  Medications  . atenolol-chlorthalidone (TENORETIC) 50-25 MG per tablet    Sig: Take 0.5 tablets by mouth daily.    Dispense:  30 tablet    Refill:  3  . levothyroxine (SYNTHROID, LEVOTHROID) 100 MCG tablet    Sig: TAKE ONE TABLET BY MOUTH ONCE DAILY    Dispense:  90 tablet    Refill:  1   Patient Instructions  You should receive a call or letter about your lab results within the next week to 10 days.   Take 1/2 tablet of your blood pressure medicine once per day.  Keep a record of your blood pressures outside of the office and bring them to the next office visit in the next month.   We will refer you to a knee specialist, but you can wear a brace if needed and tylenol if needed over the counter.   mucinex if needed for cough.  If cough not improving in next 4-5 days,  or any worsening sooner (such as fever, worsening cough or shortness of breath) - return here or emergency room.   Return to the clinic or go to the nearest emergency room if any of your symptoms worsen or new symptoms occur.  Upper Respiratory Infection, Adult An upper respiratory infection (URI) is also sometimes known as the common cold. The upper respiratory tract includes the nose, sinuses, throat, trachea, and bronchi. Bronchi are the airways leading to the lungs. Most people improve within 1 week, but symptoms can last up to 2 weeks. A residual cough may last even longer.  CAUSES Many different viruses can infect the tissues lining the upper respiratory tract. The tissues become irritated and inflamed and often become very moist. Mucus production is also common. A cold is contagious. You can easily spread the virus to others by oral contact. This includes kissing, sharing a glass, coughing, or sneezing. Touching your mouth or nose and then touching a surface, which is then touched by another person, can also spread the virus. SYMPTOMS  Symptoms typically develop 1 to 3 days after you come in contact with a cold virus. Symptoms vary from person to person. They may include:  Runny nose.  Sneezing.  Nasal congestion.  Sinus irritation.  Sore throat.  Loss of voice (laryngitis).  Cough.  Fatigue.  Muscle aches.  Loss of appetite.  Headache.  Low-grade fever. DIAGNOSIS  You might diagnose your own cold based on familiar symptoms, since most people get a cold 2 to 3 times a year. Your caregiver can confirm this based on your exam. Most importantly, your caregiver can check that your symptoms are not due to another disease such as strep throat, sinusitis, pneumonia, asthma, or epiglottitis. Blood tests, throat tests, and X-rays are not necessary to diagnose a common cold, but they may sometimes be helpful in excluding other more serious diseases. Your caregiver will decide if any  further tests are required. RISKS AND COMPLICATIONS  You may be at risk for a more severe case of the common cold if you smoke cigarettes, have chronic heart disease (such as heart failure) or lung disease (such as asthma), or  if you have a weakened immune system. The very young and very old are also at risk for more serious infections. Bacterial sinusitis, middle ear infections, and bacterial pneumonia can complicate the common cold. The common cold can worsen asthma and chronic obstructive pulmonary disease (COPD). Sometimes, these complications can require emergency medical care and may be life-threatening. PREVENTION  The best way to protect against getting a cold is to practice good hygiene. Avoid oral or hand contact with people with cold symptoms. Wash your hands often if contact occurs. There is no clear evidence that vitamin C, vitamin E, echinacea, or exercise reduces the chance of developing a cold. However, it is always recommended to get plenty of rest and practice good nutrition. TREATMENT  Treatment is directed at relieving symptoms. There is no cure. Antibiotics are not effective, because the infection is caused by a virus, not by bacteria. Treatment may include:  Increased fluid intake. Sports drinks offer valuable electrolytes, sugars, and fluids.  Breathing heated mist or steam (vaporizer or shower).  Eating chicken soup or other clear broths, and maintaining good nutrition.  Getting plenty of rest.  Using gargles or lozenges for comfort.  Controlling fevers with ibuprofen or acetaminophen as directed by your caregiver.  Increasing usage of your inhaler if you have asthma. Zinc gel and zinc lozenges, taken in the first 24 hours of the common cold, can shorten the duration and lessen the severity of symptoms. Pain medicines may help with fever, muscle aches, and throat pain. A variety of non-prescription medicines are available to treat congestion and runny nose. Your caregiver  can make recommendations and may suggest nasal or lung inhalers for other symptoms.  HOME CARE INSTRUCTIONS   Only take over-the-counter or prescription medicines for pain, discomfort, or fever as directed by your caregiver.  Use a warm mist humidifier or inhale steam from a shower to increase air moisture. This may keep secretions moist and make it easier to breathe.  Drink enough water and fluids to keep your urine clear or pale yellow.  Rest as needed.  Return to work when your temperature has returned to normal or as your caregiver advises. You may need to stay home longer to avoid infecting others. You can also use a face mask and careful hand washing to prevent spread of the virus. SEEK MEDICAL CARE IF:   After the first few days, you feel you are getting worse rather than better.  You need your caregiver's advice about medicines to control symptoms.  You develop chills, worsening shortness of breath, or brown or red sputum. These may be signs of pneumonia.  You develop yellow or brown nasal discharge or pain in the face, especially when you bend forward. These may be signs of sinusitis.  You develop a fever, swollen neck glands, pain with swallowing, or white areas in the back of your throat. These may be signs of strep throat. SEEK IMMEDIATE MEDICAL CARE IF:   You have a fever.  You develop severe or persistent headache, ear pain, sinus pain, or chest pain.  You develop wheezing, a prolonged cough, cough up blood, or have a change in your usual mucus (if you have chronic lung disease).  You develop sore muscles or a stiff neck. Document Released: 06/29/2000 Document Revised: 03/28/2011 Document Reviewed: 05/07/2010 [facility] Patient Information 2014 [place].    Cough, Adult  A cough is a reflex that helps clear your throat and airways. It can help heal the body or may be a  reaction to an irritated airway. A cough may only last 2 or 3 weeks (acute) or may last more  than 8 weeks (chronic).  CAUSES Acute cough:  Viral or bacterial infections. Chronic cough:  Infections.  Allergies.  Asthma.  Post-nasal drip.  Smoking.  Heartburn or acid reflux.  Some medicines.  Chronic lung problems (COPD).  Cancer. SYMPTOMS   Cough.  Fever.  Chest pain.  Increased breathing rate.  High-pitched whistling sound when breathing (wheezing).  Colored mucus that you cough up (sputum). TREATMENT   A bacterial cough may be treated with antibiotic medicine.  A viral cough must run its course and will not respond to antibiotics.  Your caregiver may recommend other treatments if you have a chronic cough. HOME CARE INSTRUCTIONS   Only take over-the-counter or prescription medicines for pain, discomfort, or fever as directed by your caregiver. Use cough suppressants only as directed by your caregiver.  Use a cold steam vaporizer or humidifier in your bedroom or home to help loosen secretions.  Sleep in a semi-upright position if your cough is worse at night.  Rest as needed.  Stop smoking if you smoke. SEEK IMMEDIATE MEDICAL CARE IF:   You have pus in your sputum.  Your cough starts to worsen.  You cannot control your cough with suppressants and are losing sleep.  You begin coughing up blood.  You have difficulty breathing.  You develop pain which is getting worse or is uncontrolled with medicine.  You have a fever. MAKE SURE YOU:   Understand these instructions.  Will watch your condition.  Will get help right away if you are not doing well or get worse. Document Released: 07/02/2010 Document Revised: 03/28/2011 Document Reviewed: 07/02/2010 [facility] Patient Information 2014 [place].      I personally performed the services described in this documentation, which was scribed in my presence. The recorded information has been reviewed and considered, and addended by me as needed.

## 2013-06-01 NOTE — Patient Instructions (Addendum)
You should receive a call or letter about your lab results within the next week to 10 days.   Take 1/2 tablet of your blood pressure medicine once per day.  Keep a record of your blood pressures outside of the office and bring them to the next office visit in the next month.   We will refer you to a knee specialist, but you can wear a brace if needed and tylenol if needed over the counter.   mucinex if needed for cough.  If cough not improving in next 4-5 days, or any worsening sooner (such as fever, worsening cough or shortness of breath) - return here or emergency room.   Return to the clinic or go to the nearest emergency room if any of your symptoms worsen or new symptoms occur.  Upper Respiratory Infection, Adult An upper respiratory infection (URI) is also sometimes known as the common cold. The upper respiratory tract includes the nose, sinuses, throat, trachea, and bronchi. Bronchi are the airways leading to the lungs. Most people improve within 1 week, but symptoms can last up to 2 weeks. A residual cough may last even longer.  CAUSES Many different viruses can infect the tissues lining the upper respiratory tract. The tissues become irritated and inflamed and often become very moist. Mucus production is also common. A cold is contagious. You can easily spread the virus to others by oral contact. This includes kissing, sharing a glass, coughing, or sneezing. Touching your mouth or nose and then touching a surface, which is then touched by another person, can also spread the virus. SYMPTOMS  Symptoms typically develop 1 to 3 days after you come in contact with a cold virus. Symptoms vary from person to person. They may include:  Runny nose.  Sneezing.  Nasal congestion.  Sinus irritation.  Sore throat.  Loss of voice (laryngitis).  Cough.  Fatigue.  Muscle aches.  Loss of appetite.  Headache.  Low-grade fever. DIAGNOSIS  You might diagnose your own cold based on  familiar symptoms, since most people get a cold 2 to 3 times a year. Your caregiver can confirm this based on your exam. Most importantly, your caregiver can check that your symptoms are not due to another disease such as strep throat, sinusitis, pneumonia, asthma, or epiglottitis. Blood tests, throat tests, and X-rays are not necessary to diagnose a common cold, but they may sometimes be helpful in excluding other more serious diseases. Your caregiver will decide if any further tests are required. RISKS AND COMPLICATIONS  You may be at risk for a more severe case of the common cold if you smoke cigarettes, have chronic heart disease (such as heart failure) or lung disease (such as asthma), or if you have a weakened immune system. The very young and very old are also at risk for more serious infections. Bacterial sinusitis, middle ear infections, and bacterial pneumonia can complicate the common cold. The common cold can worsen asthma and chronic obstructive pulmonary disease (COPD). Sometimes, these complications can require emergency medical care and may be life-threatening. PREVENTION  The best way to protect against getting a cold is to practice good hygiene. Avoid oral or hand contact with people with cold symptoms. Wash your hands often if contact occurs. There is no clear evidence that vitamin C, vitamin E, echinacea, or exercise reduces the chance of developing a cold. However, it is always recommended to get plenty of rest and practice good nutrition. TREATMENT  Treatment is directed at relieving symptoms. There  is no cure. Antibiotics are not effective, because the infection is caused by a virus, not by bacteria. Treatment may include:  Increased fluid intake. Sports drinks offer valuable electrolytes, sugars, and fluids.  Breathing heated mist or steam (vaporizer or shower).  Eating chicken soup or other clear broths, and maintaining good nutrition.  Getting plenty of rest.  Using gargles  or lozenges for comfort.  Controlling fevers with ibuprofen or acetaminophen as directed by your caregiver.  Increasing usage of your inhaler if you have asthma. Zinc gel and zinc lozenges, taken in the first 24 hours of the common cold, can shorten the duration and lessen the severity of symptoms. Pain medicines may help with fever, muscle aches, and throat pain. A variety of non-prescription medicines are available to treat congestion and runny nose. Your caregiver can make recommendations and may suggest nasal or lung inhalers for other symptoms.  HOME CARE INSTRUCTIONS   Only take over-the-counter or prescription medicines for pain, discomfort, or fever as directed by your caregiver.  Use a warm mist humidifier or inhale steam from a shower to increase air moisture. This may keep secretions moist and make it easier to breathe.  Drink enough water and fluids to keep your urine clear or pale yellow.  Rest as needed.  Return to work when your temperature has returned to normal or as your caregiver advises. You may need to stay home longer to avoid infecting others. You can also use a face mask and careful hand washing to prevent spread of the virus. SEEK MEDICAL CARE IF:   After the first few days, you feel you are getting worse rather than better.  You need your caregiver's advice about medicines to control symptoms.  You develop chills, worsening shortness of breath, or brown or red sputum. These may be signs of pneumonia.  You develop yellow or brown nasal discharge or pain in the face, especially when you bend forward. These may be signs of sinusitis.  You develop a fever, swollen neck glands, pain with swallowing, or white areas in the back of your throat. These may be signs of strep throat. SEEK IMMEDIATE MEDICAL CARE IF:   You have a fever.  You develop severe or persistent headache, ear pain, sinus pain, or chest pain.  You develop wheezing, a prolonged cough, cough up  blood, or have a change in your usual mucus (if you have chronic lung disease).  You develop sore muscles or a stiff neck. Document Released: 06/29/2000 Document Revised: 03/28/2011 Document Reviewed: 05/07/2010 [facility] Patient Information 2014 [place], [place].    Cough, Adult  A cough is a reflex that helps clear your throat and airways. It can help heal the body or may be a reaction to an irritated airway. A cough may only last 2 or 3 weeks (acute) or may last more than 8 weeks (chronic).  CAUSES Acute cough:  Viral or bacterial infections. Chronic cough:  Infections.  Allergies.  Asthma.  Post-nasal drip.  Smoking.  Heartburn or acid reflux.  Some medicines.  Chronic lung problems (COPD).  Cancer. SYMPTOMS   Cough.  Fever.  Chest pain.  Increased breathing rate.  High-pitched whistling sound when breathing (wheezing).  Colored mucus that you cough up (sputum). TREATMENT   A bacterial cough may be treated with antibiotic medicine.  A viral cough must run its course and will not respond to antibiotics.  Your caregiver may recommend other treatments if you have a chronic cough. HOME CARE INSTRUCTIONS   Only  take over-the-counter or prescription medicines for pain, discomfort, or fever as directed by your caregiver. Use cough suppressants only as directed by your caregiver.  Use a cold steam vaporizer or humidifier in your bedroom or home to help loosen secretions.  Sleep in a semi-upright position if your cough is worse at night.  Rest as needed.  Stop smoking if you smoke. SEEK IMMEDIATE MEDICAL CARE IF:   You have pus in your sputum.  Your cough starts to worsen.  You cannot control your cough with suppressants and are losing sleep.  You begin coughing up blood.  You have difficulty breathing.  You develop pain which is getting worse or is uncontrolled with medicine.  You have a fever. MAKE SURE YOU:   Understand these  instructions.  Will watch your condition.  Will get help right away if you are not doing well or get worse. Document Released: 07/02/2010 Document Revised: 03/28/2011 Document Reviewed: 07/02/2010 [facility] Patient Information 2014 [place].

## 2013-06-02 LAB — COMPLETE METABOLIC PANEL WITH GFR
ALT: 19 U/L (ref 0–35)
AST: 23 U/L (ref 0–37)
Albumin: 4.2 g/dL (ref 3.5–5.2)
Alkaline Phosphatase: 70 U/L (ref 39–117)
BUN: 20 mg/dL (ref 6–23)
CALCIUM: 9.5 mg/dL (ref 8.4–10.5)
CHLORIDE: 101 meq/L (ref 96–112)
CO2: 27 meq/L (ref 19–32)
CREATININE: 0.91 mg/dL (ref 0.50–1.10)
GFR, Est African American: 76 mL/min
GFR, Est Non African American: 66 mL/min
Glucose, Bld: 87 mg/dL (ref 70–99)
Potassium: 4.1 mEq/L (ref 3.5–5.3)
Sodium: 141 mEq/L (ref 135–145)
Total Bilirubin: 1.4 mg/dL — ABNORMAL HIGH (ref 0.2–1.2)
Total Protein: 7.8 g/dL (ref 6.0–8.3)

## 2013-06-02 LAB — LIPID PANEL
CHOLESTEROL: 181 mg/dL (ref 0–200)
HDL: 45 mg/dL (ref >39)
LDL CALC: 116 mg/dL — AB (ref 0–99)
TRIGLYCERIDES: 98 mg/dL (ref <150)
Total CHOL/HDL Ratio: 4 Ratio
VLDL: 20 mg/dL (ref 0–40)

## 2013-06-02 LAB — TSH: TSH: 1.894 u[IU]/mL (ref 0.350–4.500)

## 2013-06-07 NOTE — Progress Notes (Signed)
LMVM to CB to schedule a bp f/up.

## 2013-06-13 ENCOUNTER — Other Ambulatory Visit: Payer: Self-pay | Admitting: Family Medicine

## 2013-06-14 ENCOUNTER — Telehealth: Payer: Self-pay | Admitting: Family Medicine

## 2013-06-14 NOTE — Telephone Encounter (Signed)
Appt made

## 2013-06-14 NOTE — Telephone Encounter (Signed)
Message copied by Chinita Pester on Fri Jun 14, 2013 11:18 AM ------      Message from: Wendie Agreste      Created: Sat Jun 01, 2013  9:16 PM       1 month appt for follow up HTN with Carlota Raspberry or [place].  ------

## 2013-06-20 ENCOUNTER — Ambulatory Visit: Payer: Medicare PPO

## 2013-06-20 ENCOUNTER — Ambulatory Visit (INDEPENDENT_AMBULATORY_CARE_PROVIDER_SITE_OTHER): Payer: 59 | Admitting: Family Medicine

## 2013-06-20 VITALS — BP 128/74 | HR 60 | Temp 98.7°F | Resp 16 | Ht 61.0 in | Wt 145.8 lb

## 2013-06-20 DIAGNOSIS — R059 Cough, unspecified: Secondary | ICD-10-CM | POA: Diagnosis not present

## 2013-06-20 DIAGNOSIS — R05 Cough: Secondary | ICD-10-CM

## 2013-06-20 DIAGNOSIS — Z9109 Other allergy status, other than to drugs and biological substances: Secondary | ICD-10-CM | POA: Diagnosis not present

## 2013-06-20 DIAGNOSIS — E876 Hypokalemia: Secondary | ICD-10-CM | POA: Diagnosis not present

## 2013-06-20 MED ORDER — BENZONATATE 100 MG PO CAPS: 200.0000 mg | ORAL_CAPSULE | Freq: Two times a day (BID) | ORAL | Status: DC | PRN

## 2013-06-20 MED ORDER — POTASSIUM CITRATE ER 10 MEQ (1080 MG) PO TBCR: 10.0000 meq | EXTENDED_RELEASE_TABLET | Freq: Every day | ORAL | Status: DC

## 2013-06-20 NOTE — Progress Notes (Signed)
Chief Complaint:  Chief Complaint  Patient presents with  . Cough    x 3 weeks    HPI: Rachel Quinn is a 68 y.o. female who is here for:  3 week history of cough, her back hurts when she coughs She used to have medication cough on ACEI but not on ARB No fevers or chills. No wheezes or SOB. No ear pain. Denies GERD. No throat pain She works in a daycare. The kids are always sick so she gets it sometimes.  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 Does not use mask when she uses spray. She does it instead of her children sicne they state she does nto clean the bathroom well.   Takes 10 Meq Klorcon daily, needs refills, was told to take TID but she has always been taking it daily and her last CMP was normal.  She has appt with Dr Nyoka Cowden in July for BP recheck  Knee pain is chronic, she uses tylenol or ibuprofen 1-2 times a week, would like work note to help with knee and back pain so she does not have to carry large children > 30 lbs. Prior knee xray shows arthritis.   Past Medical History  Diagnosis Date  . Heart murmur   . Thyroid disease   . Chronic kidney disease   . Hypertension    Past Surgical History  Procedure Laterality Date  . Tubal ligation    . Thyroidectomy  01/17/2002  . Nephrectomy  01/18/1999    nephrolithiasis   History   Social History  . Marital Status: Married    Spouse Name: N/A    Number of Children: N/A  . Years of Education: N/A   Social History Main Topics  . Smoking status: Never Smoker   . Smokeless tobacco: None  . Alcohol Use: No  . Drug Use: No  . Sexual Activity: No   Other Topics Concern  . None   Social History Narrative  . None   No family history on file. No Known Allergies Prior to Admission medications   Medication Sig Start Date End Date Taking? Authorizing Provider  atenolol-chlorthalidone (TENORETIC) 50-25 MG per tablet Take 0.5 tablets by mouth daily. 06/01/13   Yes Wendie Agreste, MD  ipratropium (ATROVENT) 0.03 % nasal spray Place 2 sprays into the nose 2 (two) times daily. 01/18/13  Yes Wardell Honour, MD  levothyroxine (SYNTHROID, LEVOTHROID) 100 MCG tablet TAKE ONE TABLET BY MOUTH ONCE DAILY 06/01/13  Yes Wendie Agreste, MD  potassium citrate (UROCIT-K) 10 MEQ (1080 MG) SR tablet TAKE ONE TABLET BY MOUTH THREE TIMES DAILY WITH MEALS   Yes Wendie Agreste, MD  traMADol (ULTRAM) 50 MG tablet Take 1 tablet (50 mg total) by mouth every 8 (eight) hours as needed. 01/10/13  Yes Marquett Bertoli P Bryah [place], DO     ROS: The patient denies fevers, chills, night sweats, unintentional weight loss, chest pain, palpitations, wheezing, dyspnea on exertion, nausea, vomiting, abdominal pain, dysuria, hematuria, melena, numbness, weakness, or tingling.   All other systems have been reviewed and were otherwise negative with the exception of those mentioned in the HPI and as above.    PHYSICAL EXAM: Filed Vitals:   06/20/13 0931  BP: 128/74  Pulse: 60  Temp: 98.7 F (37.1 C)  Resp: 16   Filed Vitals:   06/20/13 0931  Height: 5\' 1"  (1.549 m)  Weight: 145  lb 12.8 oz (66.134 kg)   Body mass index is 27.56 kg/(m^2).  General: Alert, no acute distress HEENT:  Normocephalic, atraumatic, oropharynx patent. EOMI, PERRLA Cardiovascular:  Regular rate and rhythm, no rubs murmurs or gallops.  No Carotid bruits, radial pulse intact. No pedal edema.  Respiratory: Clear to auscultation bilaterally.  No wheezes, rales, or rhonchi.  No cyanosis, no use of accessory musculature GI: No organomegaly, abdomen is soft and non-tender, positive bowel sounds.  No masses. Skin: No rashes. Neurologic: Facial musculature symmetric. Psychiatric: Patient is appropriate throughout our interaction. Lymphatic: No cervical lymphadenopathy Musculoskeletal: Gait intact. Slightly antalgic when initially get up. Otehrwise nl   LABS: Results for orders placed in visit on 06/01/13  COMPLETE METABOLIC  PANEL WITH GFR      Result Value Ref Range   Sodium 141  135 - 145 mEq/L   Potassium 4.1  3.5 - 5.3 mEq/L   Chloride 101  96 - 112 mEq/L   CO2 27  19 - 32 mEq/L   Glucose, Bld 87  70 - 99 mg/dL   BUN 20  6 - 23 mg/dL   Creat 0.91  0.50 - 1.10 mg/dL   Total Bilirubin 1.4 (*) 0.2 - 1.2 mg/dL   Alkaline Phosphatase 70  39 - 117 U/L   AST 23  0 - 37 U/L   ALT 19  0 - 35 U/L   Total Protein 7.8  6.0 - 8.3 g/dL   Albumin 4.2  3.5 - 5.2 g/dL   Calcium 9.5  8.4 - 10.5 mg/dL   GFR, Est African American 76     GFR, Est Non African American 66    LIPID PANEL      Result Value Ref Range   Cholesterol 181  0 - 200 mg/dL   Triglycerides 98  <150 mg/dL   HDL 45  >39 mg/dL   Total CHOL/HDL Ratio 4.0     VLDL 20  0 - 40 mg/dL   LDL Cholesterol 116 (*) 0 - 99 mg/dL  TSH      Result Value Ref Range   TSH 1.894  0.350 - 4.500 uIU/mL     EKG/XRAY:   Primary read interpreted by Dr. Marin Comment at [facility]. No acute cardipoumonary process   ASSESSMENT/PLAN: Encounter Diagnoses  Name Primary?  . Cough Yes  . Hypokalemia   . Environmental allergies    She is doing well overall, I have seen her before these sxs she has had in the past. Today's Chest xray shows no acute changes Cough is viral or environmental allergy related, she should try to avoid house cleaning sprays when she is at her second job or at least get her children to use the sprays instead of herself when cleaning the offices/bathrooms. She states that they do not do a good job. RxTessalon perles, wear masks when she uses cleaning sprays Work not for no lifting above 30 lbs due to back pain, she has an old t spine compression fx on old xrays F/u prn or with Dr Sara Chu in July  Gross sideeffects, risk and benefits, and alternatives of medications d/w patient. Patient is aware that all medications have potential sideeffects and we are unable to predict every sideeffect or drug-drug interaction that may occur.  Glenford Bayley, DO 06/20/2013 11:28  AM

## 2013-07-22 ENCOUNTER — Ambulatory Visit: Payer: Self-pay | Admitting: Family Medicine

## 2013-12-02 ENCOUNTER — Other Ambulatory Visit: Payer: Self-pay | Admitting: Family Medicine

## 2014-01-16 ENCOUNTER — Other Ambulatory Visit: Payer: Self-pay | Admitting: Physician Assistant

## 2014-01-19 ENCOUNTER — Ambulatory Visit (INDEPENDENT_AMBULATORY_CARE_PROVIDER_SITE_OTHER): Payer: 59 | Admitting: Family Medicine

## 2014-01-19 VITALS — HR 60 | Temp 98.0°F | Resp 19 | Ht 61.0 in | Wt 153.0 lb

## 2014-01-19 DIAGNOSIS — E032 Hypothyroidism due to medicaments and other exogenous substances: Secondary | ICD-10-CM | POA: Diagnosis not present

## 2014-01-19 DIAGNOSIS — I10 Essential (primary) hypertension: Secondary | ICD-10-CM

## 2014-01-19 MED ORDER — LEVOTHYROXINE SODIUM 100 MCG PO TABS: ORAL_TABLET | ORAL | Status: DC

## 2014-01-19 MED ORDER — POTASSIUM CITRATE ER 10 MEQ (1080 MG) PO TBCR: 10.0000 meq | EXTENDED_RELEASE_TABLET | Freq: Every day | ORAL | Status: DC

## 2014-01-19 MED ORDER — ATENOLOL-CHLORTHALIDONE 50-25 MG PO TABS: 0.5000 | ORAL_TABLET | Freq: Every day | ORAL | Status: DC

## 2014-01-19 NOTE — Progress Notes (Signed)
Subjective:    Patient ID: Rachel Quinn, female    DOB: 02-08-1945, 69 y.o.   MRN: [id number]  HPI Chief Complaint  Patient presents with   Medication Refill   This chart was scribed for Robyn Haber, MD by Thea Alken, ED Scribe. This patient was seen in room 2 and the patient's care was started at 4:02 PM.  HPI Comments: Presentaci L Rachel Quinn is a 69 y.o. female who presents to the [facility] for a medication refill. Pt is requesting medication refill of synthroid, Atenolol, and Urocit. Pt reports intermittent dizziness with quick movements. Pt denies abdominal pain. She reports hx of thyroidectomy. Pt is from the [place].   Past Medical History  Diagnosis Date   Heart murmur    Thyroid disease    Chronic kidney disease    Hypertension    Past Surgical History  Procedure Laterality Date   Tubal ligation     Thyroidectomy  01/17/2002   Nephrectomy  01/18/1999    nephrolithiasis    No Known Allergies Prior to Admission medications   Medication Sig Start Date End Date Taking? Authorizing Provider  atenolol-chlorthalidone (TENORETIC) 50-25 MG per tablet Take 0.5 tablets by mouth daily. 06/01/13  Yes Wendie Agreste, MD  benzonatate (TESSALON) 100 MG capsule Take 2 capsules (200 mg total) by mouth 2 (two) times daily as needed for cough. 06/20/13  Yes Thao P Le, DO  ipratropium (ATROVENT) 0.03 % nasal spray Place 2 sprays into the nose 2 (two) times daily. 01/18/13  Yes Wardell Honour, MD  levothyroxine (SYNTHROID, LEVOTHROID) 100 MCG tablet TAKE ONE TABLET BY MOUTH ONCE DAILY.  "NEEDS OFFICE VISIT FOR ADDITIONAL REFILLS" 01/17/14  Yes Chelle S Jeffery, PA-C  potassium citrate (UROCIT-K) 10 MEQ (1080 MG) SR tablet Take 1 tablet (10 mEq total) by mouth daily. 06/20/13  Yes Thao P Le, DO  traMADol (ULTRAM) 50 MG tablet Take 1 tablet (50 mg total) by mouth every 8 (eight) hours as needed. 01/10/13  Yes Thao P Le, DO   Review of Systems   Objective:     Physical Exam  Constitutional: She is oriented to person, place, and time. She appears well-developed and well-nourished. No distress.  HENT:  Head: Normocephalic and atraumatic.  Eyes: Conjunctivae and EOM are normal.  Neck: Normal range of motion. Neck supple. No thyromegaly present.  Cardiovascular: Normal rate, regular rhythm and normal heart sounds.  Exam reveals no gallop and no friction rub.   No murmur heard. Pulmonary/Chest: Effort normal and breath sounds normal. No respiratory distress. She has no wheezes. She has no rales. She exhibits no tenderness.  Musculoskeletal: Normal range of motion.  Neurological: She is alert and oriented to person, place, and time.  Skin: Skin is warm and dry.  Psychiatric: She has a normal mood and affect. Her behavior is normal.  Nursing note and vitals reviewed.   Assessment & Plan:   1. 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    Dispense:  30 tablet    Refill:  5   levothyroxine (SYNTHROID, LEVOTHROID) 100 MCG tablet    Sig: TAKE ONE TABLET BY MOUTH ONCE DAILY.    Dispense:  30 tablet    Refill:  5   potassium citrate (UROCIT-K) 10 MEQ (1080 MG) SR tablet  Sig: Take 1 tablet (10 mEq total) by mouth daily.    Dispense:  30 tablet    Refill:  11   This chart was scribed in my presence and reviewed by me personally.    ICD-9-CM ICD-10-CM   1. 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

## 2014-04-02 ENCOUNTER — Ambulatory Visit (INDEPENDENT_AMBULATORY_CARE_PROVIDER_SITE_OTHER): Payer: Medicare Other

## 2014-04-02 ENCOUNTER — Ambulatory Visit (INDEPENDENT_AMBULATORY_CARE_PROVIDER_SITE_OTHER): Payer: Medicare Other | Admitting: Family Medicine

## 2014-04-02 VITALS — BP 144/82 | HR 64 | Temp 99.2°F | Resp 18 | Ht 60.5 in | Wt 151.0 lb

## 2014-04-02 DIAGNOSIS — J22 Unspecified acute lower respiratory infection: Secondary | ICD-10-CM

## 2014-04-02 DIAGNOSIS — R509 Fever, unspecified: Secondary | ICD-10-CM | POA: Diagnosis not present

## 2014-04-02 DIAGNOSIS — R059 Cough, unspecified: Secondary | ICD-10-CM

## 2014-04-02 DIAGNOSIS — R05 Cough: Secondary | ICD-10-CM

## 2014-04-02 DIAGNOSIS — R062 Wheezing: Secondary | ICD-10-CM

## 2014-04-02 DIAGNOSIS — J988 Other specified respiratory disorders: Secondary | ICD-10-CM | POA: Diagnosis not present

## 2014-04-02 LAB — POCT CBC
Granulocyte percent: 81.2 %G — AB (ref 37–80)
HCT, POC: 47.2 % (ref 37.7–47.9)
Hemoglobin: 14.9 g/dL (ref 12.2–16.2)
Lymph, poc: 1.2 (ref 0.6–3.4)
MCH, POC: 29.6 pg (ref 27–31.2)
MCHC: 31.7 g/dL — AB (ref 31.8–35.4)
MCV: 93.4 fL (ref 80–97)
MID (cbc): 0.2 (ref 0–0.9)
MPV: 7.3 fL (ref 0–99.8)
POC Granulocyte: 6.3 (ref 2–6.9)
POC LYMPH PERCENT: 15.8 % (ref 10–50)
POC MID %: 3 % (ref 0–12)
Platelet Count, POC: 177 10*3/uL (ref 142–424)
RBC: 5.05 M/uL (ref 4.04–5.48)
RDW, POC: 14.5 %
WBC: 7.8 10*3/uL (ref 4.6–10.2)

## 2014-04-02 MED ORDER — ALBUTEROL SULFATE HFA 108 (90 BASE) MCG/ACT IN AERS: 2.0000 | INHALATION_SPRAY | Freq: Four times a day (QID) | RESPIRATORY_TRACT | Status: DC | PRN

## 2014-04-02 MED ORDER — BENZONATATE 100 MG PO CAPS: 200.0000 mg | ORAL_CAPSULE | Freq: Two times a day (BID) | ORAL | Status: DC | PRN

## 2014-04-02 MED ORDER — LEVOFLOXACIN 500 MG PO TABS: 500.0000 mg | ORAL_TABLET | Freq: Every day | ORAL | Status: DC

## 2014-04-02 MED ORDER — ALBUTEROL SULFATE (2.5 MG/3ML) 0.083% IN NEBU
2.5000 mg | INHALATION_SOLUTION | Freq: Once | RESPIRATORY_TRACT | Status: AC
  Administered 2014-04-02: 2.5 mg via RESPIRATORY_TRACT

## 2014-04-02 MED ORDER — IPRATROPIUM BROMIDE 0.02 % IN SOLN
0.5000 mg | Freq: Once | RESPIRATORY_TRACT | Status: AC
  Administered 2014-04-02: 0.5 mg via RESPIRATORY_TRACT

## 2014-04-02 NOTE — Patient Instructions (Signed)
Pneumonia Pneumonia is an infection of the lungs.  CAUSES Pneumonia may be caused by bacteria or a virus. Usually, these infections are caused by breathing infectious particles into the lungs (respiratory tract). SIGNS AND SYMPTOMS   Cough.  Fever.  Chest pain.  Increased rate of breathing.  Wheezing.  Mucus production. DIAGNOSIS  If you have the common symptoms of pneumonia, your health care provider will typically confirm the diagnosis with a chest X-ray. The X-ray will show an abnormality in the lung (pulmonary infiltrate) if you have pneumonia. Other tests of your blood, urine, or sputum may be done to find the specific cause of your pneumonia. Your health care provider may also do tests (blood gases or pulse oximetry) to see how well your lungs are working. TREATMENT  Some forms of pneumonia may be spread to other people when you cough or sneeze. You may be asked to wear a mask before and during your exam. Pneumonia that is caused by bacteria is treated with antibiotic medicine. Pneumonia that is caused by the influenza virus may be treated with an antiviral medicine. Most other viral infections must run their course. These infections will not respond to antibiotics.  HOME CARE INSTRUCTIONS   Cough suppressants may be used if you are losing too much rest. However, coughing protects you by clearing your lungs. You should avoid using cough suppressants if you can.  Your health care provider may have prescribed medicine if he or she thinks your pneumonia is caused by bacteria or influenza. Finish your medicine even if you start to feel better.  Your health care provider may also prescribe an expectorant. This loosens the mucus to be coughed up.  Take medicines only as directed by your health care provider.  Do not smoke. Smoking is a common cause of bronchitis and can contribute to pneumonia. If you are a smoker and continue to smoke, your cough may last several weeks after your  pneumonia has cleared.  A cold steam vaporizer or humidifier in your room or home may help loosen mucus.  Coughing is often worse at night. Sleeping in a semi-upright position in a recliner or using a couple pillows under your head will help with this.  Get rest as you feel it is needed. Your body will usually let you know when you need to rest. PREVENTION A pneumococcal shot (vaccine) is available to prevent a common bacterial cause of pneumonia. This is usually suggested for:  People over 65 years old.  Patients on chemotherapy.  People with chronic lung problems, such as bronchitis or emphysema.  People with immune system problems. If you are over 65 or have a high risk condition, you may receive the pneumococcal vaccine if you have not received it before. In some countries, a routine influenza vaccine is also recommended. This vaccine can help prevent some cases of pneumonia.You may be offered the influenza vaccine as part of your care. If you smoke, it is time to quit. You may receive instructions on how to stop smoking. Your health care provider can provide medicines and counseling to help you quit. SEEK MEDICAL CARE IF: You have a fever. SEEK IMMEDIATE MEDICAL CARE IF:   Your illness becomes worse. This is especially true if you are elderly or weakened from any other disease.  You cannot control your cough with suppressants and are losing sleep.  You begin coughing up blood.  You develop pain which is getting worse or is uncontrolled with medicines.  Any of the symptoms   which initially brought you in for treatment are getting worse rather than better.  You develop shortness of breath or chest pain. MAKE SURE YOU:   Understand these instructions.  Will watch your condition.  Will get help right away if you are not doing well or get worse. Document Released: 01/03/2005 Document Revised: 05/20/2013 Document Reviewed: 03/25/2010 [facility] Patient Information 2015  [place], [place]. This information is not intended to replace advice given to you by your health care provider. Make sure you discuss any questions you have with your health care provider. Acute Bronchitis Bronchitis is inflammation of the airways that extend from the windpipe into the lungs (bronchi). The inflammation often causes mucus to develop. This leads to a cough, which is the most common symptom of bronchitis.  In acute bronchitis, the condition usually develops suddenly and goes away over time, usually in a couple weeks. Smoking, allergies, and asthma can make bronchitis worse. Repeated episodes of bronchitis may cause further lung problems.  CAUSES Acute bronchitis is most often caused by the same virus that causes a cold. The virus can spread from person to person (contagious) through coughing, sneezing, and touching contaminated objects. SIGNS AND SYMPTOMS   Cough.   Fever.   Coughing up mucus.   Body aches.   Chest congestion.   Chills.   Shortness of breath.   Sore throat.  DIAGNOSIS  Acute bronchitis is usually diagnosed through a physical exam. Your health care provider will also ask you questions about your medical history. Tests, such as chest X-rays, are sometimes done to rule out other conditions.  TREATMENT  Acute bronchitis usually goes away in a couple weeks. Oftentimes, no medical treatment is necessary. Medicines are sometimes given for relief of fever or cough. Antibiotic medicines are usually not needed but may be prescribed in certain situations. In some cases, an inhaler may be recommended to help reduce shortness of breath and control the cough. A cool mist vaporizer may also be used to help thin bronchial secretions and make it easier to clear the chest.  HOME CARE INSTRUCTIONS  Get plenty of rest.   Drink enough fluids to keep your urine clear or pale yellow (unless you have a medical condition that requires fluid restriction). Increasing fluids  may help thin your respiratory secretions (sputum) and reduce chest congestion, and it will prevent dehydration.   Take medicines only as directed by your health care provider.  If you were prescribed an antibiotic medicine, finish it all even if you start to feel better.  Avoid smoking and secondhand smoke. Exposure to cigarette smoke or irritating chemicals will make bronchitis worse. If you are a smoker, consider using nicotine gum or skin patches to help control withdrawal symptoms. Quitting smoking will help your lungs heal faster.   Reduce the chances of another bout of acute bronchitis by washing your hands frequently, avoiding people with cold symptoms, and trying not to touch your hands to your mouth, nose, or eyes.   Keep all follow-up visits as directed by your health care provider.  SEEK MEDICAL CARE IF: Your symptoms do not improve after 1 week of treatment.  SEEK IMMEDIATE MEDICAL CARE IF:  You develop an increased fever or chills.   You have chest pain.   You have severe shortness of breath.  You have bloody sputum.   You develop dehydration.  You faint or repeatedly feel like you are going to pass out.  You develop repeated vomiting.  You develop a severe headache. MAKE  SURE YOU:   Understand these instructions.  Will watch your condition.  Will get help right away if you are not doing well or get worse. Document Released: 02/11/2004 Document Revised: 05/20/2013 Document Reviewed: 06/26/2012 [facility] Patient Information 2015 [place], [place]. This information is not intended to replace advice given to you by your health care provider. Make sure you discuss any questions you have with your health care provider.

## 2014-04-02 NOTE — Progress Notes (Signed)
Chief Complaint:  Chief Complaint  Patient presents with  . Fever    x3 days  . Cough    HPI: Presentaci Rachel Quinn is a 69 y.o. female who is here for 3 day history of cough, subjective fever, and wheezing. She works in the daycare. She started having symptoms Friday night. It got worse on Monday. She has tried Tylenol, throat lozenges and also Tessalon Perles. She denies any chest pain or shortness of breath. Denies any nausea vomiting abdominal pain, ear pain or sinus pain. She had had less coughing since she is taken the [employer]. Her last real fever was on Monday.   Past Medical History  Diagnosis Date  . Heart murmur   . Thyroid disease   . Chronic kidney disease   . Hypertension    Past Surgical History  Procedure Laterality Date  . Tubal ligation    . Thyroidectomy  01/17/2002  . Nephrectomy  01/18/1999    nephrolithiasis   History   Social History  . Marital Status: Married    Spouse Name: N/A  . Number of Children: N/A  . Years of Education: N/A   Social History Main Topics  . Smoking status: Never Smoker   . Smokeless tobacco: Not on file  . Alcohol Use: No  . Drug Use: No  . Sexual Activity: No   Other Topics Concern  . None   Social History Narrative   History reviewed. No pertinent family history. No Known Allergies Prior to Admission medications   Medication Sig Start Date End Date Taking? Authorizing Provider  atenolol-chlorthalidone (TENORETIC) 50-25 MG per tablet Take 0.5 tablets by mouth daily. 01/19/14  Yes Robyn Haber, MD  levothyroxine (SYNTHROID, LEVOTHROID) 100 MCG tablet TAKE ONE TABLET BY MOUTH ONCE DAILY. 01/19/14  Yes Robyn Haber, MD  potassium citrate (UROCIT-K) 10 MEQ (1080 MG) SR tablet Take 1 tablet (10 mEq total) by mouth daily. 01/19/14  Yes Robyn Haber, MD  benzonatate (TESSALON) 100 MG capsule Take 2 capsules (200 mg total) by mouth 2 (two) times daily as needed for cough. Patient not taking: Reported on  04/02/2014 06/20/13   Thao P Le, DO  ipratropium (ATROVENT) 0.03 % nasal spray Place 2 sprays into the nose 2 (two) times daily. Patient not taking: Reported on 04/02/2014 01/18/13   Wardell Honour, MD  traMADol (ULTRAM) 50 MG tablet Take 1 tablet (50 mg total) by mouth every 8 (eight) hours as needed. Patient not taking: Reported on 04/02/2014 01/10/13   Thao P Le, DO     ROS: The patient denies  night sweats, unintentional weight loss, chest pain, palpitations, wheezing, dyspnea on exertion, nausea, vomiting, abdominal pain, dysuria, hematuria, melena, numbness, weakness, or tingling.  All other systems have been reviewed and were otherwise negative with the exception of those mentioned in the HPI and as above.    PHYSICAL EXAM: Filed Vitals:   04/02/14 1558  BP: 144/82  Pulse: 64  Temp: 99.2 F (37.3 C)  Resp: 18   SpO2 Readings from Last 3 Encounters:  04/02/14 96%  01/19/14 98%  06/20/13 99%    Filed Vitals:   04/02/14 1558  Height: 5' 0.5" (1.537 m)  Weight: 151 lb (68.493 kg)   Body mass index is 28.99 kg/(m^2).  General: Alert, no acute distress HEENT:  Normocephalic, atraumatic, oropharynx patent. EOMI, PERRLA TM normal, negative sinus tenderness. Throat is within normal limits. Cardiovascular:  Regular rate and rhythm, no rubs murmurs or gallops.  No Carotid bruits, radial pulse intact. No pedal edema.  Respiratory: +  wheezes, + rhonchi. No rales.  No cyanosis, no use of accessory musculature GI: No organomegaly, abdomen is soft and non-tender, positive bowel sounds.  No masses. Skin: No rashes. Neurologic: Facial musculature symmetric. Psychiatric: Patient is appropriate throughout our interaction. Lymphatic: No cervical lymphadenopathy Musculoskeletal: Gait intact.   LABS: Results for orders placed or performed in visit on 04/02/14  POCT CBC  Result Value Ref Range   WBC 7.8 4.6 - 10.2 K/uL   Lymph, poc 1.2 0.6 - 3.4   POC LYMPH PERCENT 15.8 10 - 50 %Rachel    MID (cbc) 0.2 0 - 0.9   POC MID % 3.0 0 - 12 %M   POC Granulocyte 6.3 2 - 6.9   Granulocyte percent 81.2 (A) 37 - 80 %G   RBC 5.05 4.04 - 5.48 M/uL   Hemoglobin 14.9 12.2 - 16.2 g/dL   HCT, POC 47.2 37.7 - 47.9 %   MCV 93.4 80 - 97 fL   MCH, POC 29.6 27 - 31.2 pg   MCHC 31.7 (A) 31.8 - 35.4 g/dL   RDW, POC 14.5 %   Platelet Count, POC 177 142 - 424 K/uL   MPV 7.3 0 - 99.8 fL     EKG/XRAY:   Primary read interpreted by Dr. Marin Comment at [facility]. ? Right mid lobe patchy infiltrate vs bronchitis   ASSESSMENT/PLAN: Encounter Diagnoses  Name Primary?  . Fever and chills   . Cough   . Lower respiratory infection (e.g., bronchitis, pneumonia, pneumonitis, pulmonitis) Yes  . Wheezing    Pleasant 69 year old [place] female who is here for lower respiratory symptoms. Her chest x-ray looks like there might be patchy infiltrate versus acute bronchitis. I will go ahead and cover for pneumonia with Levaquin. She does not have any abnormal kidney function on the last set of labs. She was advised that if she has any joint pain that she needs to stop the Levaquin. Refill Tessalon Perles Prescribed albuterol inhaler. She did terrible with her pre-and post peak flow due to poor technique Advise to follow-up if she's not improved in 48-72 hours. She needs to get a pneumonia vaccine if she has not had one. As to follow-up for that when she is feeling well.   Gross sideeffects, risk and benefits, and alternatives of medications d/w patient. Patient is aware that all medications have potential sideeffects and we are unable to predict every sideeffect or drug-drug interaction that may occur.  LE, Elberta, DO 04/02/2014 5:46 PM

## 2014-08-20 ENCOUNTER — Other Ambulatory Visit: Payer: Self-pay | Admitting: Family Medicine

## 2014-08-27 ENCOUNTER — Other Ambulatory Visit: Payer: Self-pay | Admitting: Family Medicine

## 2014-10-20 ENCOUNTER — Other Ambulatory Visit: Payer: Self-pay | Admitting: Physician Assistant

## 2014-10-23 ENCOUNTER — Telehealth: Payer: Self-pay

## 2014-10-23 ENCOUNTER — Ambulatory Visit (INDEPENDENT_AMBULATORY_CARE_PROVIDER_SITE_OTHER): Payer: Medicare Other | Admitting: Emergency Medicine

## 2014-10-23 VITALS — BP 144/88 | HR 53 | Temp 98.2°F | Resp 16 | Ht 60.0 in | Wt 154.8 lb

## 2014-10-23 DIAGNOSIS — E890 Postprocedural hypothyroidism: Secondary | ICD-10-CM

## 2014-10-23 DIAGNOSIS — I10 Essential (primary) hypertension: Secondary | ICD-10-CM | POA: Diagnosis not present

## 2014-10-23 MED ORDER — ATENOLOL-CHLORTHALIDONE 50-25 MG PO TABS: 1.0000 | ORAL_TABLET | Freq: Every day | ORAL | Status: DC

## 2014-10-23 MED ORDER — POTASSIUM CITRATE ER 10 MEQ (1080 MG) PO TBCR: 10.0000 meq | EXTENDED_RELEASE_TABLET | Freq: Every day | ORAL | Status: DC

## 2014-10-23 NOTE — Progress Notes (Signed)
Subjective:  Patient ID: Rachel Quinn, female    DOB: 10-14-1945  Age: 69 y.o. MRN: [id number]  CC: Medication Refill   HPI Rachel Quinn presents     For refill on her antihypertesives and thyroid replacement she is taking one half of a Tenoretic daily her blood pressure now is 144/88 she has one pill remaining  She has no adverse affect medication no evidence of an end organ injury. She's not very compliant. She has no acute complaint.  History Rachel has a past medical history of Heart murmur; Thyroid disease; Chronic kidney disease; and Hypertension.   She has past surgical history that includes Tubal ligation; Thyroidectomy (01/17/2002); and Nephrectomy (01/18/1999).   Her  family history is not on file.  She   reports that she has never smoked. She does not have any smokeless tobacco history on file. She reports that she does not drink alcohol or use illicit drugs.  Outpatient Prescriptions Prior to Visit  Medication Sig Dispense Refill  . levothyroxine (SYNTHROID, LEVOTHROID) 100 MCG tablet TAKE ONE TABLET BY MOUTH ONCE DAILY "OFFICE VISIT NEEDED AND LABS) 15 tablet 0  . atenolol-chlorthalidone (TENORETIC) 50-25 MG per tablet Take 0.5 tablets by mouth daily. 30 tablet 5  . potassium citrate (UROCIT-K) 10 MEQ (1080 MG) SR tablet Take 1 tablet (10 mEq total) by mouth daily. 30 tablet 11  . albuterol (PROVENTIL HFA;VENTOLIN HFA) 108 (90 BASE) MCG/ACT inhaler Inhale 2 puffs into the lungs every 6 (six) hours as needed for wheezing or shortness of breath. (Patient not taking: Reported on 10/23/2014) 1 Inhaler 0  . benzonatate (TESSALON) 100 MG capsule Take 2 capsules (200 mg total) by mouth 2 (two) times daily as needed for cough. (Patient not taking: Reported on 10/23/2014) 30 capsule 0  . ipratropium (ATROVENT) 0.03 % nasal spray Place 2 sprays into the nose 2 (two) times daily. (Patient not taking: Reported on 04/02/2014) 30 mL 0  . levofloxacin (LEVAQUIN) 500 MG  tablet Take 1 tablet (500 mg total) by mouth daily. (Patient not taking: Reported on 10/23/2014) 7 tablet 0  . traMADol (ULTRAM) 50 MG tablet Take 1 tablet (50 mg total) by mouth every 8 (eight) hours as needed. (Patient not taking: Reported on 04/02/2014) 40 tablet 0   No facility-administered medications prior to visit.    Social History   Social History  . Marital Status: Married    Spouse Name: N/A  . Number of Children: N/A  . Years of Education: N/A   Social History Main Topics  . Smoking status: Never Smoker   . Smokeless tobacco: None  . Alcohol Use: No  . Drug Use: No  . Sexual Activity: No   Other Topics Concern  . None   Social History Narrative     Review of Systems  Constitutional: Negative for fever, chills and appetite change.  HENT: Negative for congestion, ear pain, postnasal drip, sinus pressure and sore throat.   Eyes: Negative for pain and redness.  Respiratory: Negative for cough, shortness of breath and wheezing.   Cardiovascular: Negative for leg swelling.  Gastrointestinal: Negative for nausea, vomiting, abdominal pain, diarrhea, constipation and blood in stool.  Endocrine: Negative for polyuria.  Genitourinary: Negative for dysuria, urgency, frequency and flank pain.  Musculoskeletal: Negative for gait problem.  Skin: Negative for rash.  Neurological: Negative for weakness and headaches.  Psychiatric/Behavioral: Negative for confusion and decreased concentration. The patient is not nervous/anxious.     Objective:  BP 142/88 mmHg  Pulse 53  Temp(Src) 98.2 F (36.8 C) (Oral)  Resp 16  Ht 5' (1.524 m)  Wt 154 lb 12.8 oz (70.217 kg)  BMI 30.23 kg/m2  SpO2 97%  Physical Exam  Constitutional: She is oriented to person, place, and time. She appears well-developed and well-nourished. No distress.  HENT:  Head: Normocephalic and atraumatic.  Right Ear: External ear normal.  Left Ear: External ear normal.  Nose: Nose normal.  Eyes:  Conjunctivae and EOM are normal. Pupils are equal, round, and reactive to light. No scleral icterus.  Neck: Normal range of motion. Neck supple. No tracheal deviation present.  Cardiovascular: Normal rate, regular rhythm and normal heart sounds.   Pulmonary/Chest: Effort normal. No respiratory distress. She has no wheezes. She has no rales.  Abdominal: She exhibits no mass. There is no tenderness. There is no rebound and no guarding.  Musculoskeletal: She exhibits no edema.  Lymphadenopathy:    She has no cervical adenopathy.  Neurological: She is alert and oriented to person, place, and time. Coordination normal.  Skin: Skin is warm and dry. No rash noted.  Psychiatric: She has a normal mood and affect. Her behavior is normal.      Assessment & Plan:   Rachel was seen today for medication refill.  Diagnoses and all orders for this visit:  Essential hypertension -     CBC -     Basic metabolic panel -     Lipid panel -     atenolol-chlorthalidone (TENORETIC) 50-25 MG tablet; Take 1 tablet by mouth daily. -     potassium citrate (UROCIT-K) 10 MEQ (1080 MG) SR tablet; Take 1 tablet (10 mEq total) by mouth daily.  Hypothyroidism, postsurgical -     CBC -     Basic metabolic panel -     Lipid panel -     TSH   I have changed Rachel Quinn atenolol-chlorthalidone. I am also having her maintain her traMADol, ipratropium, levofloxacin, benzonatate, albuterol, levothyroxine, and potassium citrate.  Meds ordered this encounter  Medications  . atenolol-chlorthalidone (TENORETIC) 50-25 MG tablet    Sig: Take 1 tablet by mouth daily.    Dispense:  30 tablet    Refill:  5  . potassium citrate (UROCIT-K) 10 MEQ (1080 MG) SR tablet    Sig: Take 1 tablet (10 mEq total) by mouth daily.    Dispense:  30 tablet    Refill:  11    Appropriate red flag conditions were discussed with the patient as well as actions that should be taken.  Patient expressed his understanding.  Follow-up:  Return if symptoms worsen or fail to improve.  Roselee Culver, MD

## 2014-10-23 NOTE — Telephone Encounter (Signed)
Pt wants [place] to please call daughter Darrick Meigs with results of her lab work [phone].

## 2014-10-23 NOTE — Patient Instructions (Signed)
Hypertension Hypertension, commonly called high blood pressure, is when the force of blood pumping through your arteries is too strong. Your arteries are the blood vessels that carry blood from your heart throughout your body. A blood pressure reading consists of a higher number over a lower number, such as 110/72. The higher number (systolic) is the pressure inside your arteries when your heart pumps. The lower number (diastolic) is the pressure inside your arteries when your heart relaxes. Ideally you want your blood pressure below 120/80. Hypertension forces your heart to work harder to pump blood. Your arteries may become narrow or stiff. Having untreated or uncontrolled hypertension can cause heart attack, stroke, kidney disease, and other problems. RISK FACTORS Some risk factors for high blood pressure are controllable. Others are not.  Risk factors you cannot control include:   Race. You may be at higher risk if you are African American.  Age. Risk increases with age.  Gender. Men are at higher risk than women before age 45 years. After age 65, women are at higher risk than men. Risk factors you can control include:  Not getting enough exercise or physical activity.  Being overweight.  Getting too much fat, sugar, calories, or salt in your diet.  Drinking too much alcohol. SIGNS AND SYMPTOMS Hypertension does not usually cause signs or symptoms. Extremely high blood pressure (hypertensive crisis) may cause headache, anxiety, shortness of breath, and nosebleed. DIAGNOSIS To check if you have hypertension, your health care provider will measure your blood pressure while you are seated, with your arm held at the level of your heart. It should be measured at least twice using the same arm. Certain conditions can cause a difference in blood pressure between your right and left arms. A blood pressure reading that is higher than normal on one occasion does not mean that you need treatment. If  it is not clear whether you have high blood pressure, you may be asked to return on a different day to have your blood pressure checked again. Or, you may be asked to monitor your blood pressure at home for 1 or more weeks. TREATMENT Treating high blood pressure includes making lifestyle changes and possibly taking medicine. Living a healthy lifestyle can help lower high blood pressure. You may need to change some of your habits. Lifestyle changes may include:  Following the DASH diet. This diet is high in fruits, vegetables, and whole grains. It is low in salt, red meat, and added sugars.  Keep your sodium intake below 2,300 mg per day.  Getting at least 30-45 minutes of aerobic exercise at least 4 times per week.  Losing weight if necessary.  Not smoking.  Limiting alcoholic beverages.  Learning ways to reduce stress. Your health care provider may prescribe medicine if lifestyle changes are not enough to get your blood pressure under control, and if one of the following is true:  You are 18-59 years of age and your systolic blood pressure is above 140.  You are 60 years of age or older, and your systolic blood pressure is above 150.  Your diastolic blood pressure is above 90.  You have diabetes, and your systolic blood pressure is over 140 or your diastolic blood pressure is over 90.  You have kidney disease and your blood pressure is above 140/90.  You have heart disease and your blood pressure is above 140/90. Your personal target blood pressure may vary depending on your medical conditions, your age, and other factors. HOME CARE INSTRUCTIONS    Have your blood pressure rechecked as directed by your health care provider.   Take medicines only as directed by your health care provider. Follow the directions carefully. Blood pressure medicines must be taken as prescribed. The medicine does not work as well when you skip doses. Skipping doses also puts you at risk for  problems.  Do not smoke.   Monitor your blood pressure at home as directed by your health care provider. SEEK MEDICAL CARE IF:   You think you are having a reaction to medicines taken.  You have recurrent headaches or feel dizzy.  You have swelling in your ankles.  You have trouble with your vision. SEEK IMMEDIATE MEDICAL CARE IF:  You develop a severe headache or confusion.  You have unusual weakness, numbness, or feel faint.  You have severe chest or abdominal pain.  You vomit repeatedly.  You have trouble breathing. MAKE SURE YOU:   Understand these instructions.  Will watch your condition.  Will get help right away if you are not doing well or get worse.   This information is not intended to replace advice given to you by your health care provider. Make sure you discuss any questions you have with your health care provider.   Document Released: 01/03/2005 Document Revised: 05/20/2014 Document Reviewed: 10/26/2012 [employer] Interactive Patient Education 2016 [employer]  

## 2014-10-24 LAB — BASIC METABOLIC PANEL
BUN: 24 mg/dL (ref 7–25)
CO2: 28 mmol/L (ref 20–31)
Calcium: 9.1 mg/dL (ref 8.6–10.4)
Chloride: 103 mmol/L (ref 98–110)
Creat: 0.97 mg/dL (ref 0.50–0.99)
GLUCOSE: 86 mg/dL (ref 65–99)
POTASSIUM: 4.1 mmol/L (ref 3.5–5.3)
SODIUM: 138 mmol/L (ref 135–146)

## 2014-10-24 LAB — CBC
HEMATOCRIT: 41.2 % (ref 36.0–46.0)
HEMOGLOBIN: 14 g/dL (ref 12.0–15.0)
MCH: 30.5 pg (ref 26.0–34.0)
MCHC: 34 g/dL (ref 30.0–36.0)
MCV: 89.8 fL (ref 78.0–100.0)
MPV: 10.6 fL (ref 8.6–12.4)
Platelets: 197 10*3/uL (ref 150–400)
RBC: 4.59 MIL/uL (ref 3.87–5.11)
RDW: 13.8 % (ref 11.5–15.5)
WBC: 5.6 10*3/uL (ref 4.0–10.5)

## 2014-10-24 LAB — LIPID PANEL
CHOLESTEROL: 167 mg/dL (ref 125–200)
HDL: 38 mg/dL — ABNORMAL LOW (ref >=46)
LDL Cholesterol: 103 mg/dL (ref <130)
Total CHOL/HDL Ratio: 4.4 Ratio (ref <=5.0)
Triglycerides: 131 mg/dL (ref <150)
VLDL: 26 mg/dL (ref <30)

## 2014-10-24 LAB — TSH: TSH: 0.338 u[IU]/mL — AB (ref 0.350–4.500)

## 2014-10-25 ENCOUNTER — Other Ambulatory Visit: Payer: Self-pay | Admitting: Emergency Medicine

## 2014-10-25 MED ORDER — LEVOTHYROXINE SODIUM 88 MCG PO TABS: ORAL_TABLET | ORAL | Status: DC

## 2015-04-27 ENCOUNTER — Ambulatory Visit (INDEPENDENT_AMBULATORY_CARE_PROVIDER_SITE_OTHER): Payer: Medicare Other | Admitting: Family Medicine

## 2015-04-27 VITALS — BP 130/80 | HR 95 | Temp 98.1°F | Resp 16 | Ht 61.0 in | Wt 163.0 lb

## 2015-04-27 DIAGNOSIS — E039 Hypothyroidism, unspecified: Secondary | ICD-10-CM

## 2015-04-27 DIAGNOSIS — E876 Hypokalemia: Secondary | ICD-10-CM | POA: Diagnosis not present

## 2015-04-27 DIAGNOSIS — I10 Essential (primary) hypertension: Secondary | ICD-10-CM

## 2015-04-27 MED ORDER — ATENOLOL-CHLORTHALIDONE 50-25 MG PO TABS: 1.0000 | ORAL_TABLET | Freq: Every day | ORAL | Status: DC

## 2015-04-27 MED ORDER — LEVOTHYROXINE SODIUM 88 MCG PO TABS: ORAL_TABLET | ORAL | Status: DC

## 2015-04-27 MED ORDER — POTASSIUM CITRATE ER 10 MEQ (1080 MG) PO TBCR: 10.0000 meq | EXTENDED_RELEASE_TABLET | Freq: Every day | ORAL | Status: DC

## 2015-04-27 NOTE — Progress Notes (Signed)
Subjective:    Patient ID: Rachel Quinn, female    DOB: April 03, 1945, 70 y.o.   MRN: [id number]  HPI Presentaci L Earlie Rachel Quinn is a 70 y.o. female  Here for medication refills.  Hypertension  - Takes atenolol/chlorthalidone 50/25 mg daily. No new side effects. Does not check home BP's. No headache/dizziness/chest pain. Not fasting - ate 5 hrs ago.   Lab Results  Component Value Date   CREATININE 0.97 10/23/2014    Hypothyroidism Takes Synthroid 88 g daily. Slightly low TSH when checked in October.Dose was decreased from 100 g 88 g at that time. No new skin changes, no hair changes, no heat/cold intolerance or known weight changes.  Lab Results  Component Value Date   TSH 0.338* 10/23/2014   Wt Readings from Last 3 Encounters:  04/27/15 163 lb (73.936 kg)  10/23/14 154 lb 12.8 oz (70.217 kg)  04/02/14 151 lb (68.493 kg)   Hypokalemia. Potassium normal at 4.1 when last checked in October. Takes potassium citrate 10 mEq daily.  Labs from last visit:  Results for orders placed or performed in visit on 10/23/14  CBC  Result Value Ref Range   WBC 5.6 4.0 - 10.5 K/uL   RBC 4.59 3.87 - 5.11 MIL/uL   Hemoglobin 14.0 12.0 - 15.0 g/dL   HCT 41.2 36.0 - 46.0 %   MCV 89.8 78.0 - 100.0 fL   MCH 30.5 26.0 - 34.0 pg   MCHC 34.0 30.0 - 36.0 g/dL   RDW 13.8 11.5 - 15.5 %   Platelets 197 150 - 400 K/uL   MPV 10.6 8.6 - 12.4 fL  Basic metabolic panel  Result Value Ref Range   Sodium 138 135 - 146 mmol/L   Potassium 4.1 3.5 - 5.3 mmol/L   Chloride 103 98 - 110 mmol/L   CO2 28 20 - 31 mmol/L   Glucose, Bld 86 65 - 99 mg/dL   BUN 24 7 - 25 mg/dL   Creat 0.97 0.50 - 0.99 mg/dL   Calcium 9.1 8.6 - 10.4 mg/dL  Lipid panel  Result Value Ref Range   Cholesterol 167 125 - 200 mg/dL   Triglycerides 131 <150 mg/dL   HDL 38 (L) >=46 mg/dL   Total CHOL/HDL Ratio 4.4 <=5.0 Ratio   VLDL 26 <30 mg/dL   LDL Cholesterol 103 <130 mg/dL  TSH  Result Value Ref Range   TSH 0.338 (L)  0.350 - 4.500 uIU/mL     Patient Active Problem List   Diagnosis Date Noted  . 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 Hypertension 04/30/2012   Past Medical History  Diagnosis Date  . Heart murmur   . Thyroid disease   . Chronic kidney disease   . Hypertension    Past Surgical History  Procedure Laterality Date  . Tubal ligation    . Thyroidectomy  01/17/2002  . Nephrectomy  01/18/1999    nephrolithiasis   No Known Allergies Prior to Admission medications   Medication Sig Start Date End Date Taking? Authorizing Provider  albuterol (PROVENTIL HFA;VENTOLIN HFA) 108 (90 BASE) MCG/ACT inhaler Inhale 2 puffs into the lungs every 6 (six) hours as needed for wheezing or shortness of breath. 04/02/14  Yes Thao P Le, DO  atenolol-chlorthalidone (TENORETIC) 50-25 MG tablet Take 1 tablet by mouth daily. 10/23/14  Yes Roselee Culver, MD  levothyroxine (SYNTHROID, LEVOTHROID) 88 MCG tablet TAKE ONE TABLET BY MOUTH ONCE DAILY 10/25/14  Yes Roselee Culver, MD  potassium citrate (UROCIT-K) 10 MEQ (  1080 MG) SR tablet Take 1 tablet (10 mEq total) by mouth daily. 10/23/14  Yes Roselee Culver, MD   Social History   Social History  . Marital Status: Married    Spouse Name: N/A  . Number of Children: N/A  . Years of Education: N/A   Occupational History  . Not on file.   Social History Main Topics  . Smoking status: Never Smoker   . Smokeless tobacco: Never Used  . Alcohol Use: No  . Drug Use: No  . Sexual Activity: No   Other Topics Concern  . Not on file   Social History Narrative      Review of Systems  Constitutional: Negative for fatigue and unexpected weight change.  Respiratory: Negative for chest tightness and shortness of breath.   Cardiovascular: Negative for chest pain, palpitations and leg swelling.  Gastrointestinal: Negative for abdominal pain and blood in stool.  Neurological: Negative for dizziness, syncope, light-headedness and headaches.         Objective:   Physical Exam  Constitutional: She is oriented to person, place, and time. She appears well-developed and well-nourished.  HENT:  Head: Normocephalic and atraumatic.  Eyes: Conjunctivae and EOM are normal. Pupils are equal, round, and reactive to light.  Neck: Carotid bruit is not present.  Cardiovascular: Normal rate, regular rhythm, normal heart sounds and intact distal pulses.   Pulmonary/Chest: Effort normal and breath sounds normal.  Abdominal: Soft. She exhibits no pulsatile midline mass. There is no tenderness.  Neurological: She is alert and oriented to person, place, and time.  Skin: Skin is warm and dry.  Psychiatric: She has a normal mood and affect. Her behavior is normal.  Vitals reviewed.  Filed Vitals:   04/27/15 1743  BP: 130/80  Pulse: 95  Temp: 98.1 F (36.7 C)  TempSrc: Oral  Resp: 16  Height: 5\' 1"  (1.549 m)  Weight: 163 lb (73.936 kg)  SpO2: 95%        Assessment & Plan:   Presentaci L Rachel Quinn is a 70 y.o. female Essential hypertension - Plan: potassium citrate (UROCIT-K) 10 MEQ (1080 MG) SR tablet, atenolol-chlorthalidone (TENORETIC) 50-25 MG tablet  -Stable. Check labs, continue Tenoretic 50/25 mg daily.  Hypothyroidism, unspecified hypothyroidism type - Plan: TSH, levothyroxine (SYNTHROID, LEVOTHROID) 88 MCG tablet  -Check TSH, appears to be tolerating lower dose. Continue 88 MCG daily until TSH known. Plan on follow-up in 6 months  Hypokalemia - Plan: Basic metabolic panel, potassium citrate (UROCIT-K) 10 MEQ (1080 MG) SR tablet  -BNP pending, continue potassium 10 MeQ by mouth daily.  Follow-up the next 6 months for fasting labs/physical.  Meds ordered this encounter  Medications  . potassium citrate (UROCIT-K) 10 MEQ (1080 MG) SR tablet    Sig: Take 1 tablet (10 mEq total) by mouth daily.    Dispense:  90 tablet    Refill:  1  . levothyroxine (SYNTHROID, LEVOTHROID) 88 MCG tablet    Sig: TAKE ONE TABLET BY MOUTH ONCE DAILY     Dispense:  90 tablet    Refill:  1  . atenolol-chlorthalidone (TENORETIC) 50-25 MG tablet    Sig: Take 1 tablet by mouth daily.    Dispense:  90 tablet    Refill:  1   Patient Instructions       IF you received an x-ray today, you will receive an invoice from [facility] Rad[facility]. Please contact [facility] Radiology at [phone] with questions or concerns regarding your invoice.   IF you received  labwork today, you will receive an invoice from [employer]. Please contact [facility] at [phone] with questions or concerns regarding your invoice.   Our billing staff will not be able to assist you with questions regarding bills from these companies.  You will be contacted with the lab results as soon as they are available. The fastest way to get your results is to activate your My Chart account. Instructions are located on the last page of this paperwork. If you have not heard from [place] regarding the results in 2 weeks, please contact this office.    Continue same doses of medications for now. I refilled your medications for 6 months, but follow-up within that time to have a physical and fasting blood work.     I personally performed the services described in this documentation, which was scribed in my presence. The recorded information has been reviewed and considered, and addended by me as needed.

## 2015-04-27 NOTE — Patient Instructions (Addendum)
     IF you received an x-ray today, you will receive an invoice from [facility] Radiology. Please contact [facility] Radiology at [phone] with questions or concerns regarding your invoice.   IF you received labwork today, you will receive an invoice from [employer]. Please contact [facility] at [phone] with questions or concerns regarding your invoice.   Our billing staff will not be able to assist you with questions regarding bills from these companies.  You will be contacted with the lab results as soon as they are available. The fastest way to get your results is to activate your My Chart account. Instructions are located on the last page of this paperwork. If you have not heard from [place] regarding the results in 2 weeks, please contact this office.    Continue same doses of medications for now. I refilled your medications for 6 months, but follow-up within that time to have a physical and fasting blood work.

## 2015-04-28 ENCOUNTER — Encounter: Payer: Self-pay | Admitting: Family Medicine

## 2015-04-28 LAB — BASIC METABOLIC PANEL
BUN: 20 mg/dL (ref 7–25)
CALCIUM: 9.8 mg/dL (ref 8.6–10.4)
CO2: 30 mmol/L (ref 20–31)
Chloride: 99 mmol/L (ref 98–110)
Creat: 0.89 mg/dL (ref 0.50–0.99)
GLUCOSE: 92 mg/dL (ref 65–99)
POTASSIUM: 3.9 mmol/L (ref 3.5–5.3)
SODIUM: 137 mmol/L (ref 135–146)

## 2015-04-28 LAB — TSH: TSH: 0.94 m[IU]/L

## 2015-05-10 ENCOUNTER — Encounter: Payer: Self-pay | Admitting: *Deleted

## 2015-10-26 ENCOUNTER — Telehealth: Payer: Self-pay | Admitting: Physician Assistant

## 2015-10-26 DIAGNOSIS — E876 Hypokalemia: Secondary | ICD-10-CM

## 2015-10-26 DIAGNOSIS — I10 Essential (primary) hypertension: Secondary | ICD-10-CM

## 2015-10-26 MED ORDER — LEVOTHYROXINE SODIUM 88 MCG PO TABS: ORAL_TABLET | ORAL | 0 refills | Status: DC

## 2015-10-26 MED ORDER — POTASSIUM CITRATE ER 10 MEQ (1080 MG) PO TBCR: 10.0000 meq | EXTENDED_RELEASE_TABLET | Freq: Every day | ORAL | 1 refills | Status: DC

## 2015-10-26 MED ORDER — ATENOLOL-CHLORTHALIDONE 50-25 MG PO TABS: 1.0000 | ORAL_TABLET | Freq: Every day | ORAL | 0 refills | Status: DC

## 2015-10-26 NOTE — Telephone Encounter (Signed)
30-day supply provided. Advised patient that OV and labs required for additional fills this medication.  Meds ordered this encounter  Medications  . atenolol-chlorthalidone (TENORETIC) 50-25 MG tablet    Sig: Take 1 tablet by mouth daily.    Dispense:  30 tablet    Refill:  0    Order Specific Question:   Supervising Provider    Answer:   SHAW, EVA N [[id number]]  . levothyroxine (SYNTHROID, LEVOTHROID) 88 MCG tablet    Sig: TAKE ONE TABLET BY MOUTH ONCE DAILY    Dispense:  30 tablet    Refill:  0    Order Specific Question:   Supervising Provider    Answer:   SHAW, EVA N [[id number]]  . potassium citrate (UROCIT-K) 10 MEQ (1080 MG) SR tablet    Sig: Take 1 tablet (10 mEq total) by mouth daily.    Dispense:  30 tablet    Refill:  1    Order Specific Question:   Supervising Provider    Answer:   Brigitte Pulse, EVA N [[id number]]

## 2015-10-27 ENCOUNTER — Other Ambulatory Visit: Payer: Self-pay | Admitting: Family Medicine

## 2015-10-27 DIAGNOSIS — I10 Essential (primary) hypertension: Secondary | ICD-10-CM

## 2015-10-29 ENCOUNTER — Ambulatory Visit (INDEPENDENT_AMBULATORY_CARE_PROVIDER_SITE_OTHER): Payer: Medicare Other | Admitting: Family Medicine

## 2015-10-29 VITALS — BP 120/82 | HR 61 | Temp 98.0°F | Resp 17 | Ht 61.0 in | Wt 163.0 lb

## 2015-10-29 DIAGNOSIS — I10 Essential (primary) hypertension: Secondary | ICD-10-CM

## 2015-10-29 DIAGNOSIS — E876 Hypokalemia: Secondary | ICD-10-CM

## 2015-10-29 DIAGNOSIS — E890 Postprocedural hypothyroidism: Secondary | ICD-10-CM

## 2015-10-29 LAB — COMPREHENSIVE METABOLIC PANEL
ALBUMIN: 3.7 g/dL (ref 3.6–5.1)
ALT: 21 U/L (ref 6–29)
AST: 23 U/L (ref 10–35)
Alkaline Phosphatase: 63 U/L (ref 33–130)
BUN: 17 mg/dL (ref 7–25)
CHLORIDE: 103 mmol/L (ref 98–110)
CO2: 27 mmol/L (ref 20–31)
Calcium: 8.7 mg/dL (ref 8.6–10.4)
Creat: 0.87 mg/dL (ref 0.50–0.99)
Glucose, Bld: 93 mg/dL (ref 65–99)
POTASSIUM: 4 mmol/L (ref 3.5–5.3)
Sodium: 139 mmol/L (ref 135–146)
TOTAL PROTEIN: 6.8 g/dL (ref 6.1–8.1)
Total Bilirubin: 1.5 mg/dL — ABNORMAL HIGH (ref 0.2–1.2)

## 2015-10-29 LAB — TSH: TSH: 1.96 mIU/L

## 2015-10-29 LAB — LIPID PANEL
CHOL/HDL RATIO: 4.6 ratio (ref <=5.0)
CHOLESTEROL: 170 mg/dL (ref 125–200)
HDL: 37 mg/dL — ABNORMAL LOW (ref >=46)
LDL Cholesterol: 99 mg/dL (ref <130)
TRIGLYCERIDES: 169 mg/dL — AB (ref <150)
VLDL: 34 mg/dL — AB (ref <30)

## 2015-10-29 LAB — CBC
HEMATOCRIT: 42.7 % (ref 35.0–45.0)
HEMOGLOBIN: 14.4 g/dL (ref 11.7–15.5)
MCH: 31 pg (ref 27.0–33.0)
MCHC: 33.7 g/dL (ref 32.0–36.0)
MCV: 91.8 fL (ref 80.0–100.0)
MPV: 10 fL (ref 7.5–12.5)
Platelets: 181 10*3/uL (ref 140–400)
RBC: 4.65 MIL/uL (ref 3.80–5.10)
RDW: 14.2 % (ref 11.0–15.0)
WBC: 6.5 10*3/uL (ref 3.8–10.8)

## 2015-10-29 MED ORDER — ATENOLOL-CHLORTHALIDONE 50-25 MG PO TABS: 1.0000 | ORAL_TABLET | Freq: Every day | ORAL | 2 refills | Status: DC

## 2015-10-29 NOTE — Patient Instructions (Addendum)
I have sent in your potassium and blood pressure medicine.  We are checking her blood today including her thyroid.  I will send in your thyroid medicine once we know we do not have to change the dose.  It was good to meet you today!    IF you received an x-ray today, you will receive an invoice from [facility] Radiology. Please contact [facility] Radiology at [phone] with questions or concerns regarding your invoice.   IF you received labwork today, you will receive an invoice from [employer]. Please contact [facility] at [phone] with questions or concerns regarding your invoice.   Our billing staff will not be able to assist you with questions regarding bills from these companies.  You will be contacted with the lab results as soon as they are available. The fastest way to get your results is to activate your My Chart account. Instructions are located on the last page of this paperwork. If you have not heard from [place] regarding the results in 2 weeks, please contact this office.     We recommend that you schedule a mammogram for breast cancer screening. Typically, you do not need a referral to do this. Please contact a local imaging center to schedule your mammogram.  [facility] - [phone]  *ask for the Radiology Department [facility] ([place]) - [phone] or [phone]  [facility] - [phone] [place] [phone] [facility] - [phone]  *ask for the [facility] - [phone]  *ask for the Radiology Department [facility] - [phone]  *ask for the [facility] - [phone]

## 2015-10-29 NOTE — Progress Notes (Signed)
   Rachel Quinn is a 70 y.o. female who presents to [facility] today for medication refills:  1.  Hypertension:  Long-term problem for this patient.  No adverse effects from medication.  Not checking it regularly.  No HA, CP, dizziness, shortness of breath, palpitations, or LE swelling.   BP Readings from Last 3 Encounters:  10/29/15 120/82  04/27/15 130/80  10/23/14 (!) 144/88   2.  Hypothyroidism:  Postsurgical hypothyroidism.  Surgery was 2004.  She has been on synthroid since that time.  NO cold/heat intolerance, no palpitations.  No LE edema.    3.  Hypokalemia:  No cramping currently.  Eating and drinking well.    ROS as above.   PMH reviewed. Patient is a nonsmoker.   Past Medical History:  Diagnosis Date  . Chronic kidney disease   . Heart murmur   . Hypertension   . Thyroid disease    Past Surgical History:  Procedure Laterality Date  . NEPHRECTOMY  01/18/1999   nephrolithiasis  . THYROIDECTOMY  01/17/2002  . TUBAL LIGATION      Medications reviewed. Current Outpatient Prescriptions  Medication Sig Dispense Refill  . albuterol (PROVENTIL HFA;VENTOLIN HFA) 108 (90 BASE) MCG/ACT inhaler Inhale 2 puffs into the lungs every 6 (six) hours as needed for wheezing or shortness of breath. 1 Inhaler 0  . atenolol-chlorthalidone (TENORETIC) 50-25 MG tablet Take 1 tablet by mouth daily. 30 tablet 0  . levothyroxine (SYNTHROID, LEVOTHROID) 88 MCG tablet TAKE ONE TABLET BY MOUTH ONCE DAILY 30 tablet 0  . potassium citrate (UROCIT-K) 10 MEQ (1080 MG) SR tablet Take 1 tablet (10 mEq total) by mouth daily. 30 tablet 1   No current facility-administered medications for this visit.      Physical Exam:  BP 120/82 (BP Location: Left Arm, Patient Position: Sitting, Cuff Size: Large)   Pulse 61   Temp 98 F (36.7 C) (Oral)   Resp 17   Ht 5\' 1"  (1.549 m)   Wt 163 lb (73.9 kg)   SpO2 96%   BMI 30.80 kg/m  Gen:  Alert, cooperative patient who appears stated  age in no acute distress.  Vital signs reviewed. HEENT: EOMI,  MMM Neck:  Well healed surgical scar noted.   Pulm:  Clear to auscultation bilaterally with good air movement.  No wheezes or rales noted.   Cardiac:  Regular rate and rhythm.  Grade III/VI SEM noted throughout precordium. Abd:  Soft/nondistended/nontender.   Exts: Non edematous BL  LE, warm and well perfused.   Assessment and Plan:  1.  HTN: - At goal. -Refill for chlorthalidone/atenolol today. -Follow-up in 6 months for recheck.  2.  Hypothyroidism: Her TSH has been normal last check. -We are rechecking today. -She has had hyperthyroid issues in the past. I will refill her Synthroid once we know her TSH level  3. Heart murmur:   - No echo on record, but states she was evaluated by cardiologist with no concerns about 10 years ago.  - She has no angina, pre-syncope/syncope, dyspnea, palpitations - will hold on any further evaluation as she's had the murmur throughout her entire life.

## 2015-11-30 ENCOUNTER — Telehealth: Payer: Self-pay

## 2015-11-30 DIAGNOSIS — I10 Essential (primary) hypertension: Secondary | ICD-10-CM

## 2015-11-30 DIAGNOSIS — E876 Hypokalemia: Secondary | ICD-10-CM

## 2015-11-30 NOTE — Telephone Encounter (Signed)
Patient needs her atenolol-chlorthalidone (TENORETIC) 50-25 MG tablet, levothyroxine (SYNTHROID, LEVOTHROID) 88 MCG tablet and her potassium citrate (UROCIT-K) 10 MEQ (1080 MG) SR tablet refilled [place] and would like to get a 90 day supply of all three of these.  She uses the [facility] on [address].

## 2015-12-01 MED ORDER — POTASSIUM CITRATE ER 10 MEQ (1080 MG) PO TBCR: 10.0000 meq | EXTENDED_RELEASE_TABLET | Freq: Every day | ORAL | 1 refills | Status: DC

## 2015-12-01 MED ORDER — ATENOLOL-CHLORTHALIDONE 50-25 MG PO TABS: 1.0000 | ORAL_TABLET | Freq: Every day | ORAL | 2 refills | Status: DC

## 2015-12-01 MED ORDER — LEVOTHYROXINE SODIUM 88 MCG PO TABS: ORAL_TABLET | ORAL | 1 refills | Status: DC

## 2015-12-01 NOTE — Telephone Encounter (Signed)
Spoke to daughter and advised that we didn't get any electronic reqs for RFs from pharm which is what we request from them. Have not seen any faxes yet either. Advised daughter that I will send in new Rxs for K+ and levothyroxine and call pharm to make sure they get the Rxs and that they have the one already sent in 10/12 for atenolol. Pharm stated he doesn't see the one from 10/12, so I re-sent the atenolol also. He did receive the other two Rxs just sent.

## 2016-02-25 ENCOUNTER — Ambulatory Visit (INDEPENDENT_AMBULATORY_CARE_PROVIDER_SITE_OTHER): Payer: Medicare Other | Admitting: Family Medicine

## 2016-02-25 VITALS — BP 112/79 | HR 86 | Temp 101.8°F | Resp 16 | Ht 61.0 in | Wt 163.4 lb

## 2016-02-25 DIAGNOSIS — R6889 Other general symptoms and signs: Secondary | ICD-10-CM

## 2016-02-25 MED ORDER — ONDANSETRON 4 MG PO TBDP
4.0000 mg | ORAL_TABLET | Freq: Once | ORAL | Status: AC
  Administered 2016-02-25: 4 mg via ORAL

## 2016-02-25 MED ORDER — ACETAMINOPHEN 500 MG PO TABS
500.0000 mg | ORAL_TABLET | Freq: Once | ORAL | Status: AC
  Administered 2016-02-25: 500 mg via ORAL

## 2016-02-25 MED ORDER — ONDANSETRON HCL 4 MG PO TABS: 4.0000 mg | ORAL_TABLET | Freq: Three times a day (TID) | ORAL | 0 refills | Status: DC | PRN

## 2016-02-25 MED ORDER — OSELTAMIVIR PHOSPHATE 75 MG PO CAPS: 75.0000 mg | ORAL_CAPSULE | Freq: Two times a day (BID) | ORAL | 0 refills | Status: DC

## 2016-02-25 MED ORDER — HYDROCOD POLST-CPM POLST ER 10-8 MG/5ML PO SUER: 5.0000 mL | Freq: Two times a day (BID) | ORAL | 0 refills | Status: DC | PRN

## 2016-02-25 NOTE — Progress Notes (Signed)
Patient ID: Rachel Quinn, female    DOB: Sep 23, 1945, 71 y.o.   MRN: [id number]  PCP: No PCP Per Patient  Chief Complaint  Patient presents with  . Fever    and cough started Tuesday     Subjective:  HPI 71 year old presents for evaluation of influenza like symptoms. Past medical hx includes: Hypertension and hypothyroidism. Tuesday night, she developed bone pain, cough w/chills, headache, runny nose. Robitussin and Tylenol cough and cold. Some nausea without vomiting. Chest tenderness without shortness of breath or wheezing. Patient reports she hasn't used her albuterol inhaler since illness began.  Social History   Social History  . Marital status: Married    Spouse name: N/A  . Number of children: N/A  . Years of education: N/A   Occupational History  . Not on file.   Social History Main Topics  . Smoking status: Never Smoker  . Smokeless tobacco: Never Used  . Alcohol use No  . Drug use: No  . Sexual activity: No   Other Topics Concern  . Not on file   Social History Narrative  . No narrative on file   History reviewed. No pertinent family history. Review of Systems See HPI  Prior to Admission medications   Medication Sig Start Date End Date Taking? Authorizing Provider  albuterol (PROVENTIL HFA;VENTOLIN HFA) 108 (90 BASE) MCG/ACT inhaler Inhale 2 puffs into the lungs every 6 (six) hours as needed for wheezing or shortness of breath. 04/02/14  Yes Thao P Le, DO  atenolol-chlorthalidone (TENORETIC) 50-25 MG tablet Take 1 tablet by mouth daily. 12/01/15  Yes Alveda Reasons, MD  levothyroxine (SYNTHROID, LEVOTHROID) 88 MCG tablet TAKE ONE TABLET BY MOUTH ONCE DAILY 12/01/15  Yes Jaynee Eagles, PA-C  potassium citrate (UROCIT-K) 10 MEQ (1080 MG) SR tablet Take 1 tablet (10 mEq total) by mouth daily. 12/01/15  Yes Jaynee Eagles, PA-C    Past Medical, Surgical Family and Social History reviewed and updated.    Objective:   Today's Vitals   02/25/16 1001    BP: 112/79  Pulse: 86  Resp: 16  Temp: (!) 101.8 F (38.8 C)  TempSrc: Oral  SpO2: 95%  Weight: 163 lb 6.4 oz (74.1 kg)  Height: 5\' 1"  (1.549 m)    Wt Readings from Last 3 Encounters:  02/25/16 163 lb 6.4 oz (74.1 kg)  10/29/15 163 lb (73.9 kg)  04/27/15 163 lb (73.9 kg)    Physical Exam  Constitutional: She is oriented to person, place, and time. She appears well-developed and well-nourished. She has a sickly appearance. She appears ill.  HENT:  Head: Normocephalic and atraumatic.  Nose: Rhinorrhea present.  Mouth/Throat: Oropharynx is clear and moist.  Neck: Normal range of motion. Neck supple.  Cardiovascular: Normal rate, normal heart sounds and intact distal pulses.   Pulmonary/Chest: Effort normal. She has no wheezes. She has no rales. She exhibits no tenderness.  Persistent hacking cough noted during exam   Abdominal: Soft. Bowel sounds are normal. She exhibits no distension and no mass. There is no tenderness. There is no rebound and no guarding.  Lymphadenopathy:    She has no cervical adenopathy.  Neurological: She is alert and oriented to person, place, and time.  Skin: Skin is warm and dry.  Psychiatric: She has a normal mood and affect. Her behavior is normal. Judgment and thought content normal.       Assessment & Plan:  1. Flu-like symptoms, symptoms are consistent with influenza. Office is  out of rapid flu tests.  Treating empirically for influenza  Plan: - Acetaminophen 650 mg every 4-6 hours as needed for cough. -  Tamiflu 75 mg twice daily x 5 days. -Zofran 4 mg every 8 hours as needed for nausea   Return for follow-up in 7 days. If symptoms worsen return sooner for care.  Carroll Sage. Kenton Kingfisher, MSN, FNP-C [facility]

## 2016-02-25 NOTE — Patient Instructions (Addendum)
Tamiflu 75 mg twice daily x 5 day.  Zofran 4 mg every 8 hours as needed for nausea.  Tylenol 650 mg every 6 hours as needed for fever.  Return for follow-up in 7 days. If symptoms worsen return sooner for care.  IF you received an x-ray today, you will receive an invoice from [facility] Radiology. Please contact [facility] Radiology at [phone] with questions or concerns regarding your invoice.   IF you received labwork today, you will receive an invoice from [place]. Please contact [facility] at [phone] with questions or concerns regarding your invoice.   Our billing staff will not be able to assist you with questions regarding bills from these companies.  You will be contacted with the lab results as soon as they are available. The fastest way to get your results is to activate your My Chart account. Instructions are located on the last page of this paperwork. If you have not heard from [place] regarding the results in 2 weeks, please contact this office.      Influenza, Adult Influenza ("the flu") is an infection in the lungs, nose, and throat (respiratory tract). It is caused by a virus. The flu causes many common cold symptoms, as well as a high fever and body aches. It can make you feel very sick. The flu spreads easily from person to person (is contagious). Getting a flu shot (influenza vaccination) every year is the best way to prevent the flu. Follow these instructions at home:  Take over-the-counter and prescription medicines only as told by your doctor.  Use a cool mist humidifier to add moisture (humidity) to the air in your home. This can make it easier to breathe.  Rest as needed.  Drink enough fluid to keep your pee (urine) clear or pale yellow.  Cover your mouth and nose when you cough or sneeze.  Wash your hands with soap and water often, especially after you cough or sneeze. If you cannot use soap and water, use hand sanitizer.  Stay home from work or school  as told by your doctor. Unless you are visiting your doctor, try to avoid leaving home until your fever has been gone for 24 hours without the use of medicine.  Keep all follow-up visits as told by your doctor. This is important. How is this prevented?  Getting a yearly (annual) flu shot is the best way to avoid getting the flu. You may get the flu shot in late summer, fall, or winter. Ask your doctor when you should get your flu shot.  Wash your hands often or use hand sanitizer often.  Avoid contact with people who are sick during cold and flu season.  Eat healthy foods.  Drink plenty of fluids.  Get enough sleep.  Exercise regularly. Contact a doctor if:  You get new symptoms.  You have:  Chest pain.  Watery poop (diarrhea).  A fever.  Your cough gets worse.  You start to have more mucus.  You feel sick to your stomach (nauseous).  You throw up (vomit). Get help right away if:  You start to be short of breath or have trouble breathing.  Your skin or nails turn a bluish color.  You have very bad pain or stiffness in your neck.  You get a sudden headache.  You get sudden pain in your face or ear.  You cannot stop throwing up. This information is not intended to replace advice given to you by your health care provider. Make sure you discuss  any questions you have with your health care provider. Document Released: 10/13/2007 Document Revised: 06/11/2015 Document Reviewed: 10/28/2014 Elsevier Interactive Patient Education  2017 [employer].

## 2016-05-27 ENCOUNTER — Other Ambulatory Visit: Payer: Self-pay | Admitting: Urgent Care

## 2016-05-31 NOTE — Telephone Encounter (Signed)
Last TSH check was in 10/2015, was well controlled. Refills provided for next 6 months and will need an OV thereafter.

## 2016-08-31 ENCOUNTER — Ambulatory Visit (INDEPENDENT_AMBULATORY_CARE_PROVIDER_SITE_OTHER): Payer: Medicare Other | Admitting: Physician Assistant

## 2016-08-31 ENCOUNTER — Encounter: Payer: Self-pay | Admitting: Physician Assistant

## 2016-08-31 ENCOUNTER — Telehealth: Payer: Self-pay | Admitting: *Deleted

## 2016-08-31 VITALS — BP 139/76 | HR 60 | Temp 98.6°F | Resp 18 | Ht 61.0 in | Wt 167.2 lb

## 2016-08-31 DIAGNOSIS — I10 Essential (primary) hypertension: Secondary | ICD-10-CM

## 2016-08-31 DIAGNOSIS — E876 Hypokalemia: Secondary | ICD-10-CM

## 2016-08-31 DIAGNOSIS — E890 Postprocedural hypothyroidism: Secondary | ICD-10-CM

## 2016-08-31 MED ORDER — LEVOTHYROXINE SODIUM 88 MCG PO TABS: 88.0000 ug | ORAL_TABLET | Freq: Every day | ORAL | 1 refills | Status: DC

## 2016-08-31 MED ORDER — POTASSIUM CITRATE ER 10 MEQ (1080 MG) PO TBCR: 10.0000 meq | EXTENDED_RELEASE_TABLET | Freq: Every day | ORAL | 1 refills | Status: DC

## 2016-08-31 MED ORDER — ATENOLOL-CHLORTHALIDONE 50-25 MG PO TABS: 1.0000 | ORAL_TABLET | Freq: Every day | ORAL | 2 refills | Status: DC

## 2016-08-31 NOTE — Progress Notes (Signed)
PRIMARY CARE AT [place], [place] [address] [phone]  Date:  08/31/2016   Name:  Rachel Quinn   DOB:  04-29-1945   MRN:  [id number]  PCP:  Patient, No Pcp Per    History of Present Illness:  Presentaci L Rachel Quinn is a 71 y.o. female patient who presents to PCP with  Chief Complaint  Patient presents with  . Medication Refill    tenoretic, urocit-k, synthroid      HTN: Patient is compliant on medications.   No chest pains, palpitations, sob, or dizziness.   She is taking the potassium with her blood pressure medications as well  Thyroid: taking daily.  No fatigue, bm change, or skin changes.    Patient Active Problem List   Diagnosis Date Noted  . 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 Hypertension 04/30/2012    Past Medical History:  Diagnosis Date  . Chronic kidney disease   . Heart murmur   . Hypertension   . Thyroid disease     Past Surgical History:  Procedure Laterality Date  . NEPHRECTOMY  01/18/1999   nephrolithiasis  . THYROIDECTOMY  01/17/2002  . TUBAL LIGATION      Social History  Substance Use Topics  . Smoking status: Never Smoker  . Smokeless tobacco: Never Used  . Alcohol use No    No family history on file.  No Known Allergies  Medication list has been reviewed and updated.  Current Outpatient Prescriptions on File Prior to Visit  Medication Sig Dispense Refill  . albuterol (PROVENTIL HFA;VENTOLIN HFA) 108 (90 BASE) MCG/ACT inhaler Inhale 2 puffs into the lungs every 6 (six) hours as needed for wheezing or shortness of breath. 1 Inhaler 0  . atenolol-chlorthalidone (TENORETIC) 50-25 MG tablet Take 1 tablet by mouth daily. 90 tablet 2  . chlorpheniramine-HYDROcodone (TUSSIONEX PENNKINETIC ER) 10-8 MG/5ML SUER Take 5 mLs by mouth every 12 (twelve) hours as needed for cough. 100 mL 0  . levothyroxine (SYNTHROID, LEVOTHROID) 88 MCG tablet TAKE ONE TABLET BY MOUTH ONCE DAILY 90 tablet 1  . ondansetron (ZOFRAN) 4 MG  tablet Take 1 tablet (4 mg total) by mouth every 8 (eight) hours as needed for nausea or vomiting. 16 tablet 0  . oseltamivir (TAMIFLU) 75 MG capsule Take 1 capsule (75 mg total) by mouth 2 (two) times daily. 10 capsule 0  . potassium citrate (UROCIT-K) 10 MEQ (1080 MG) SR tablet Take 1 tablet (10 mEq total) by mouth daily. 90 tablet 1   No current facility-administered medications on file prior to visit.     ROS ROS otherwise unremarkable unless listed above.  Physical Examination: BP 139/76   Pulse 60   Temp 98.6 F (37 C) (Oral)   Resp 18   Ht _0  (1.549 m)   Wt 167 lb 3.2 oz (75.8 kg)   SpO2 96%   BMI 31.59 kg/m  Ideal Body Weight: Weight in (lb) to have BMI = 25: 132  Physical Exam  Constitutional: She is oriented to person, place, and time. She appears well-developed and well-nourished. No distress.  HENT:  Head: Normocephalic and atraumatic.  Right Ear: External ear normal.  Left Ear: External ear normal.  Eyes: Pupils are equal, round, and reactive to light. Conjunctivae and EOM are normal.  Cardiovascular: Normal rate, regular rhythm and normal heart sounds.  Exam reveals no gallop and no friction rub.   No murmur heard. Pulmonary/Chest: Effort normal. No respiratory distress.  Neurological: She is alert and  oriented to person, place, and time.  Skin: She is not diaphoretic.  Psychiatric: She has a normal mood and affect. Her behavior is normal.     Assessment and Plan: Presentaci L Rachel Quinn is a 71 y.o. female who is here today for cc of  Chief Complaint  Patient presents with  . Medication Refill    tenoretic, urocit-k, synthroid    Hypothyroidism, postsurgical - Plan: TSH  Essential hypertension - Plan: CMP14+EGFR, atenolol-chlorthalidone (TENORETIC) 50-25 MG tablet, DISCONTINUED: potassium citrate (UROCIT-K) 10 MEQ (1080 MG) SR tablet  Hypokalemia - Plan: DISCONTINUED: potassium citrate (UROCIT-K) 10 MEQ (1080 MG) SR tablet  Ivar Drape,  PA-C [facility] 9/2/20188:42 AM

## 2016-08-31 NOTE — Patient Instructions (Signed)
     IF you received an x-ray today, you will receive an invoice from [facility]. Please contact [facility] at [phone] with questions or concerns regarding your invoice.   IF you received labwork today, you will receive an invoice from [facility]. Please contact [facility] at [phone] with questions or concerns regarding your invoice.   Our billing staff will not be able to assist you with questions regarding bills from these companies.  You will be contacted with the lab results as soon as they are available. The fastest way to get your results is to activate your My Chart account. Instructions are located on the last page of this paperwork. If you have not heard from us regarding the results in 2 weeks, please contact this office.     

## 2016-09-01 ENCOUNTER — Other Ambulatory Visit: Payer: Self-pay | Admitting: Urgent Care

## 2016-09-01 DIAGNOSIS — I10 Essential (primary) hypertension: Secondary | ICD-10-CM

## 2016-09-01 DIAGNOSIS — E876 Hypokalemia: Secondary | ICD-10-CM

## 2016-09-01 LAB — CMP14+EGFR
A/G RATIO: 1.3 (ref 1.2–2.2)
ALT: 23 IU/L (ref 0–32)
AST: 20 IU/L (ref 0–40)
Albumin: 4 g/dL (ref 3.6–4.8)
Alkaline Phosphatase: 74 IU/L (ref 39–117)
BILIRUBIN TOTAL: 1.3 mg/dL — AB (ref 0.0–1.2)
BUN/Creatinine Ratio: 21 (ref 12–28)
BUN: 19 mg/dL (ref 8–27)
CALCIUM: 9 mg/dL (ref 8.7–10.3)
CHLORIDE: 104 mmol/L (ref 96–106)
CO2: 20 mmol/L (ref 20–29)
Creatinine, Ser: 0.92 mg/dL (ref 0.57–1.00)
GFR calc non Af Amer: 64 mL/min/{1.73_m2} (ref >59)
GFR, EST AFRICAN AMERICAN: 73 mL/min/{1.73_m2} (ref >59)
GLUCOSE: 102 mg/dL — AB (ref 65–99)
Globulin, Total: 3.1 g/dL (ref 1.5–4.5)
POTASSIUM: 4 mmol/L (ref 3.5–5.2)
Sodium: 141 mmol/L (ref 134–144)
TOTAL PROTEIN: 7.1 g/dL (ref 6.0–8.5)

## 2016-09-01 LAB — TSH: TSH: 1 u[IU]/mL (ref 0.450–4.500)

## 2016-11-09 DIAGNOSIS — Z23 Encounter for immunization: Secondary | ICD-10-CM | POA: Diagnosis not present

## 2017-01-05 ENCOUNTER — Ambulatory Visit (INDEPENDENT_AMBULATORY_CARE_PROVIDER_SITE_OTHER): Payer: Medicare Other

## 2017-01-05 ENCOUNTER — Ambulatory Visit (INDEPENDENT_AMBULATORY_CARE_PROVIDER_SITE_OTHER): Payer: Medicare Other | Admitting: Family Medicine

## 2017-01-05 ENCOUNTER — Encounter: Payer: Self-pay | Admitting: Family Medicine

## 2017-01-05 ENCOUNTER — Other Ambulatory Visit: Payer: Self-pay

## 2017-01-05 VITALS — BP 120/72 | HR 70 | Temp 97.9°F | Resp 16 | Ht 61.0 in | Wt 165.6 lb

## 2017-01-05 DIAGNOSIS — J189 Pneumonia, unspecified organism: Secondary | ICD-10-CM

## 2017-01-05 DIAGNOSIS — R05 Cough: Secondary | ICD-10-CM

## 2017-01-05 DIAGNOSIS — Z5181 Encounter for therapeutic drug level monitoring: Secondary | ICD-10-CM | POA: Diagnosis not present

## 2017-01-05 DIAGNOSIS — R059 Cough, unspecified: Secondary | ICD-10-CM

## 2017-01-05 LAB — BASIC METABOLIC PANEL
BUN/Creatinine Ratio: 18 (ref 12–28)
BUN: 17 mg/dL (ref 8–27)
CALCIUM: 9.1 mg/dL (ref 8.7–10.3)
CHLORIDE: 101 mmol/L (ref 96–106)
CO2: 25 mmol/L (ref 20–29)
Creatinine, Ser: 0.95 mg/dL (ref 0.57–1.00)
GFR calc non Af Amer: 61 mL/min/{1.73_m2} (ref >59)
GFR, EST AFRICAN AMERICAN: 70 mL/min/{1.73_m2} (ref >59)
Glucose: 97 mg/dL (ref 65–99)
POTASSIUM: 4.4 mmol/L (ref 3.5–5.2)
Sodium: 139 mmol/L (ref 134–144)

## 2017-01-05 LAB — POCT CBC
GRANULOCYTE PERCENT: 70.9 % (ref 37–80)
HEMATOCRIT: 42.8 % (ref 37.7–47.9)
Hemoglobin: 14 g/dL (ref 12.2–16.2)
LYMPH, POC: 2 (ref 0.6–3.4)
MCH, POC: 30.6 pg (ref 27–31.2)
MCHC: 32.6 g/dL (ref 31.8–35.4)
MCV: 93.9 fL (ref 80–97)
MID (CBC): 0.3 (ref 0–0.9)
MPV: 7.6 fL (ref 0–99.8)
PLATELET COUNT, POC: 205 10*3/uL (ref 142–424)
POC Granulocyte: 5.7 (ref 2–6.9)
POC LYMPH %: 25.6 % (ref 10–50)
POC MID %: 3.5 %M (ref 0–12)
RBC: 4.56 M/uL (ref 4.04–5.48)
RDW, POC: 13.5 %
WBC: 8 10*3/uL (ref 4.6–10.2)

## 2017-01-05 MED ORDER — PROMETHAZINE-CODEINE 6.25-10 MG/5ML PO SYRP: 5.0000 mL | ORAL_SOLUTION | Freq: Four times a day (QID) | ORAL | 0 refills | Status: DC | PRN

## 2017-01-05 MED ORDER — MUCINEX DM MAXIMUM STRENGTH 60-1200 MG PO TB12: 1.0000 | ORAL_TABLET | Freq: Two times a day (BID) | ORAL | 1 refills | Status: DC

## 2017-01-05 MED ORDER — ALBUTEROL SULFATE (2.5 MG/3ML) 0.083% IN NEBU
2.5000 mg | INHALATION_SOLUTION | Freq: Once | RESPIRATORY_TRACT | Status: AC
  Administered 2017-01-05: 2.5 mg via RESPIRATORY_TRACT

## 2017-01-05 MED ORDER — AZITHROMYCIN 250 MG PO TABS: ORAL_TABLET | ORAL | 0 refills | Status: DC

## 2017-01-05 MED ORDER — HYDROCOD POLST-CPM POLST ER 10-8 MG/5ML PO SUER: 5.0000 mL | Freq: Two times a day (BID) | ORAL | 0 refills | Status: DC | PRN

## 2017-01-05 NOTE — Progress Notes (Signed)
Subjective:    Patient ID: Rachel Quinn, female    DOB: 03/17/1945, 71 y.o.   MRN: [id number] Chief Complaint  Patient presents with  . URI    x 2 weeks, can't sleep due to coughing, rattle in chest     HPI  Sxs started after Thanksgiving. She has been taking robitussin with minimal relief.  Wheezing and coughing -> SHoB during coughing episodes only. Coughing has exacerbated her urinary stress incontinence.  Cough is productive of brown phlegm but no blood or black coffee grounds.  She did get the flu shot this year as she had the flu last year for the first time.  No h/o asthma, smoke, or second-hand smoke exposures.  No pedal edema, no CP, no orthopnea.    Past Medical History:  Diagnosis Date  . Chronic kidney disease   . Heart murmur   . Hypertension   . Thyroid disease    Past Surgical History:  Procedure Laterality Date  . NEPHRECTOMY  01/18/1999   nephrolithiasis  . THYROIDECTOMY  01/17/2002  . TUBAL LIGATION    No thyroid or left kidney.  Current Outpatient Medications on File Prior to Visit  Medication Sig Dispense Refill  . albuterol (PROVENTIL HFA;VENTOLIN HFA) 108 (90 BASE) MCG/ACT inhaler Inhale 2 puffs into the lungs every 6 (six) hours as needed for wheezing or shortness of breath. 1 Inhaler 0  . atenolol-chlorthalidone (TENORETIC) 50-25 MG tablet Take 1 tablet by mouth daily. 90 tablet 2  . levothyroxine (SYNTHROID, LEVOTHROID) 88 MCG tablet Take 1 tablet (88 mcg total) by mouth daily. 90 tablet 1  . ondansetron (ZOFRAN) 4 MG tablet Take 1 tablet (4 mg total) by mouth every 8 (eight) hours as needed for nausea or vomiting. 16 tablet 0  . potassium citrate (UROCIT-K) 10 MEQ (1080 MG) SR tablet TAKE ONE TABLET BY MOUTH ONCE DAILY 90 tablet 1  . chlorpheniramine-HYDROcodone (TUSSIONEX PENNKINETIC ER) 10-8 MG/5ML SUER Take 5 mLs by mouth every 12 (twelve) hours as needed for cough. (Patient not taking: Reported on 01/05/2017) 100 mL 0  . oseltamivir  (TAMIFLU) 75 MG capsule Take 1 capsule (75 mg total) by mouth 2 (two) times daily. (Patient not taking: Reported on 01/05/2017) 10 capsule 0   No current facility-administered medications on file prior to visit.    No Known Allergies History reviewed. No pertinent family history. Social History   Socioeconomic History  . Marital status: Married    Spouse name: None  . Number of children: None  . Years of education: None  . Highest education level: None  Social Needs  . Financial resource strain: None  . Food insecurity - worry: None  . Food insecurity - inability: None  . Transportation needs - medical: None  . Transportation needs - non-medical: None  Occupational History  . None  Tobacco Use  . Smoking status: Never Smoker  . Smokeless tobacco: Never Used  Substance and Sexual Activity  . Alcohol use: No    Alcohol/week: 0.0 oz  . Drug use: No  . Sexual activity: No  Other Topics Concern  . None  Social History Narrative  . None   Depression screen [facility] 2/9 01/05/2017 02/25/2016 10/29/2015 04/27/2015 10/23/2014  Decreased Interest 0 0 0 0 0  Down, Depressed, Hopeless 0 0 0 0 0  PHQ - 2 Score 0 0 0 0 0       Review of Systems  Constitutional: Positive for activity change and fatigue. Negative for appetite change,  chills, diaphoresis, fever and unexpected weight change.  HENT: Positive for congestion. Negative for ear pain, postnasal drip, rhinorrhea, sinus pressure and sinus pain.   Respiratory: Positive for cough, shortness of breath and wheezing. Negative for apnea, choking, chest tightness and stridor.   Cardiovascular: Negative for chest pain, palpitations and leg swelling.  Genitourinary: Positive for enuresis (stress with coughing). Negative for difficulty urinating, dysuria, flank pain, frequency, genital sores, hematuria and urgency.  Allergic/Immunologic: Negative for environmental allergies, food allergies and immunocompromised state.  Neurological: Negative  for syncope.  Hematological: Negative for adenopathy.  Psychiatric/Behavioral: Positive for sleep disturbance.   See hpi    Objective:   Physical Exam  Constitutional: She is oriented to person, place, and time. She appears well-developed and well-nourished. She appears lethargic. She appears ill. No distress.  HENT:  Head: Normocephalic and atraumatic.  Right Ear: Tympanic membrane, external ear and ear canal normal.  Left Ear: Tympanic membrane, external ear and ear canal normal.  Nose: Rhinorrhea present. No mucosal edema. Right sinus exhibits no maxillary sinus tenderness. Left sinus exhibits no maxillary sinus tenderness.  Mouth/Throat: Uvula is midline and mucous membranes are normal. Posterior oropharyngeal erythema present. No oropharyngeal exudate or posterior oropharyngeal edema.  Eyes: Conjunctivae are normal. Right eye exhibits no discharge. Left eye exhibits no discharge. No scleral icterus.  Neck: Normal range of motion. Neck supple.  Cardiovascular: Normal rate, regular rhythm, normal heart sounds and intact distal pulses.  Pulmonary/Chest: Effort normal. No tachypnea. No respiratory distress. She has no decreased breath sounds. She has no wheezes. She has rhonchi (diffuse exp). She has rales (bibasilar insp).  pulm exam similar but louder/slightly worse after alb neb treatment in office x 1  Lymphadenopathy:    She has no cervical adenopathy.  Neurological: She is oriented to person, place, and time. She appears lethargic.  Skin: Skin is warm and dry. She is not diaphoretic. No erythema.  Psychiatric: She has a normal mood and affect. Her behavior is normal.      BP 120/72   Pulse 70   Temp 97.9 F (36.6 C)   Resp 16   Ht 5\' 1"  (1.549 m)   Wt 165 lb 9.6 oz (75.1 kg)   SpO2 98%   BMI 31.29 kg/m   Results for orders placed or performed in visit on 01/05/17  POCT CBC  Result Value Ref Range   WBC 8.0 4.6 - 10.2 K/uL   Lymph, poc 2.0 0.6 - 3.4   POC LYMPH  PERCENT 25.6 10 - 50 %L   MID (cbc) 0.3 0 - 0.9   POC MID % 3.5 0 - 12 %M   POC Granulocyte 5.7 2 - 6.9   Granulocyte percent 70.9 37 - 80 %G   RBC 4.56 4.04 - 5.48 M/uL   Hemoglobin 14.0 12.2 - 16.2 g/dL   HCT, POC 42.8 37.7 - 47.9 %   MCV 93.9 80 - 97 fL   MCH, POC 30.6 27 - 31.2 pg   MCHC 32.6 31.8 - 35.4 g/dL   RDW, POC 13.5 %   Platelet Count, POC 205 142 - 424 K/uL   MPV 7.6 0 - 99.8 fL   Dg Chest 2 View  Result Date: 01/05/2017 CLINICAL DATA:  Productive cough EXAM: CHEST  2 VIEW COMPARISON:  04/02/2014 FINDINGS: Heart is upper limits normal in size. Peribronchial thickening. No confluent opacities or effusions. No acute bony abnormality. Diffuse aortic calcifications. IMPRESSION: Bronchitic changes. Electronically Signed   By: Rolm Baptise  M.D.   On: 01/05/2017 10:46    Assessment & Plan:   1. Cough - Due to length of sxs x 1 mo w/o any improvement and sig abnml pulm exam - suspect pt has an atypical walking pna rather than viral bronchitis though CXR and cbc reassuring. Cover with zpack. initially rx'd tussionex for cough suppression as she was also rx'd it last yr but pt does not remember ever taking it - suspect she didn't fill it due to cost - medicare supplements rarely/never cover anti-tussives so still likely cost-prohibitive. Max out on guaifenexin/dextromethophan with high dose 12hr mucinex DM sched.   Pulm exam sounds slightly worse after alb neb trx and pt denies any sx improvement so no need to retry home alb inhaler (was rx'd last yr for flu.)  2. Medication monitoring encounter   3. Walking pneumonia   4.      HTN - well controlled on atenolol-chlorthalidone 50-25, no decongestants. 5.      Med monitoring encounter - update bmp since on chlorthalidone and drawing other labs today 6.      Hypothyroid - TSH nml at 1 4 mos prior so cont on levothyroxine 88 - will not recheck today as after being ill x 1 mo could even get some temporary "sick euthyroid" type lab  changes.  OK to refill levothyroxine x 3 additional mos when needed in early Feb 2019 -  F/u for chronic med refills in 4-5 mos.  Orders Placed This Encounter  Procedures  . DG Chest 2 View    Standing Status:   Future    Number of Occurrences:   1    Standing Expiration Date:   01/05/2018    Order Specific Question:   Reason for Exam (SYMPTOM  OR DIAGNOSIS REQUIRED)    Answer:   1 mo of productive cough wiht diffuse rhonchi    Order Specific Question:   Preferred imaging location?    Answer:   External  . Basic metabolic panel    Order Specific Question:   Has the patient fasted?    Answer:   No  . POCT CBC    Meds ordered this encounter  Medications  . albuterol (PROVENTIL) (2.5 MG/3ML) 0.083% nebulizer solution 2.5 mg  . DISCONTD: chlorpheniramine-HYDROcodone (TUSSIONEX PENNKINETIC ER) 10-8 MG/5ML SUER    Sig: Take 5 mLs by mouth every 12 (twelve) hours as needed for cough.    Dispense:  100 mL    Refill:  0  . azithromycin (ZITHROMAX) 250 MG tablet    Sig: Take 2 tabs PO x 1 dose, then 1 tab PO QD x 4 days    Dispense:  6 tablet    Refill:  0  . Dextromethorphan-Guaifenesin (MUCINEX DM MAXIMUM STRENGTH) 60-1200 MG TB12    Sig: Take 1 tablet by mouth 2 (two) times daily.    Dispense:  28 each    Refill:  1  . promethazine-codeine (PHENERGAN WITH CODEINE) 6.25-10 MG/5ML syrup    Sig: Take 5 mLs by mouth every 6 (six) hours as needed for cough.    Dispense:  100 mL    Refill:  0    D/C for TUSSIONEX -NOT COVERED BY INSURANCE AND  TO COSTLY.    Delman Cheadle, M.D.  Primary Care at [facility] [address] [place], [place] [address] [phone] phone [phone] fax  01/08/17 12:46 AM

## 2017-01-05 NOTE — Patient Instructions (Addendum)
   IF you received an x-ray today, you will receive an invoice from [facility]. Please contact [facility] at [phone] with questions or concerns regarding your invoice.   IF you received labwork today, you will receive an invoice from [facility]. Please contact [facility] at [phone] with questions or concerns regarding your invoice.   Our billing staff will not be able to assist you with questions regarding bills from these companies.  You will be contacted with the lab results as soon as they are available. The fastest way to get your results is to activate your My Chart account. Instructions are located on the last page of this paperwork. If you have not heard from us regarding the results in 2 weeks, please contact this office.     Acute Bronchitis, Adult Acute bronchitis is sudden (acute) swelling of the air tubes (bronchi) in the lungs. Acute bronchitis causes these tubes to fill with mucus, which can make it hard to breathe. It can also cause coughing or wheezing. In adults, acute bronchitis usually goes away within 2 weeks. A cough caused by bronchitis may last up to 3 weeks. Smoking, allergies, and asthma can make the condition worse. Repeated episodes of bronchitis may cause further lung problems, such as chronic obstructive pulmonary disease (COPD). What are the causes? This condition can be caused by germs and by substances that irritate the lungs, including:  Cold and flu viruses. This condition is most often caused by the same virus that causes a cold.  Bacteria.  Exposure to tobacco smoke, dust, fumes, and air pollution.  What increases the risk? This condition is more likely to develop in people who:  Have close contact with someone with acute bronchitis.  Are exposed to lung irritants, such as tobacco smoke, dust, fumes, and vapors.  Have a weak immune system.  Have a respiratory condition such as asthma.  What are the signs or  symptoms? Symptoms of this condition include:  A cough.  Coughing up clear, yellow, or green mucus.  Wheezing.  Chest congestion.  Shortness of breath.  A fever.  Body aches.  Chills.  A sore throat.  How is this diagnosed? This condition is usually diagnosed with a physical exam. During the exam, your health care provider may order tests, such as chest X-rays, to rule out other conditions. He or she may also:  Test a sample of your mucus for bacterial infection.  Check the level of oxygen in your blood. This is done to check for pneumonia.  Do a chest X-ray or lung function testing to rule out pneumonia and other conditions.  Perform blood tests.  Your health care provider will also ask about your symptoms and medical history. How is this treated? Most cases of acute bronchitis clear up over time without treatment. Your health care provider may recommend:  Drinking more fluids. Drinking more makes your mucus thinner, which may make it easier to breathe.  Taking a medicine for a fever or cough.  Taking an antibiotic medicine.  Using an inhaler to help improve shortness of breath and to control a cough.  Using a cool mist vaporizer or humidifier to make it easier to breathe.  Follow these instructions at home: Medicines  Take over-the-counter and prescription medicines only as told by your health care provider.  If you were prescribed an antibiotic, take it as told by your health care provider. Do not stop taking the antibiotic even if you start to feel better. General instructions    Get plenty of rest.  Drink enough fluids to keep your urine clear or pale yellow.  Avoid smoking and secondhand smoke. Exposure to cigarette smoke or irritating chemicals will make bronchitis worse. If you smoke and you need help quitting, ask your health care provider. Quitting smoking will help your lungs heal faster.  Use an inhaler, cool mist vaporizer, or humidifier as told  by your health care provider.  Keep all follow-up visits as told by your health care provider. This is important. How is this prevented? To lower your risk of getting this condition again:  Wash your hands often with soap and water. If soap and water are not available, use hand sanitizer.  Avoid contact with people who have cold symptoms.  Try not to touch your hands to your mouth, nose, or eyes.  Make sure to get the flu shot every year.  Contact a health care provider if:  Your symptoms do not improve in 2 weeks of treatment. Get help right away if:  You cough up blood.  You have chest pain.  You have severe shortness of breath.  You become dehydrated.  You faint or keep feeling like you are going to faint.  You keep vomiting.  You have a severe headache.  Your fever or chills gets worse. This information is not intended to replace advice given to you by your health care provider. Make sure you discuss any questions you have with your health care provider. Document Released: 02/11/2004 Document Revised: 07/29/2015 Document Reviewed: 06/24/2015 Elsevier Interactive Patient Education  2018 [employer]   

## 2017-01-08 ENCOUNTER — Encounter: Payer: Self-pay | Admitting: Family Medicine

## 2017-01-09 ENCOUNTER — Encounter: Payer: Self-pay | Admitting: *Deleted

## 2017-01-30 ENCOUNTER — Ambulatory Visit (INDEPENDENT_AMBULATORY_CARE_PROVIDER_SITE_OTHER): Payer: Medicare Other | Admitting: Physician Assistant

## 2017-01-30 ENCOUNTER — Encounter: Payer: Self-pay | Admitting: Physician Assistant

## 2017-01-30 ENCOUNTER — Other Ambulatory Visit: Payer: Self-pay

## 2017-01-30 ENCOUNTER — Ambulatory Visit (INDEPENDENT_AMBULATORY_CARE_PROVIDER_SITE_OTHER): Payer: Medicare Other

## 2017-01-30 VITALS — BP 116/72 | HR 64 | Temp 98.0°F | Resp 16 | Ht 61.0 in | Wt 163.4 lb

## 2017-01-30 DIAGNOSIS — M25512 Pain in left shoulder: Secondary | ICD-10-CM

## 2017-01-30 DIAGNOSIS — M7502 Adhesive capsulitis of left shoulder: Secondary | ICD-10-CM | POA: Diagnosis not present

## 2017-01-30 DIAGNOSIS — M19012 Primary osteoarthritis, left shoulder: Secondary | ICD-10-CM | POA: Diagnosis not present

## 2017-01-30 MED ORDER — PREDNISONE 20 MG PO TABS: ORAL_TABLET | ORAL | 0 refills | Status: DC

## 2017-01-30 NOTE — Patient Instructions (Addendum)
Start the prednisone tomorrow.  I would like you to take the prednisone at this time. Please perform three of the stretches below three times per day.    Adhesive Capsulitis Adhesive capsulitis is inflammation of the tendons and ligaments that surround the shoulder joint (shoulder capsule). This condition causes the shoulder to become stiff and painful to move. Adhesive capsulitis is also called frozen shoulder. What are the causes? This condition may be caused by:  An injury to the shoulder joint.  Straining the shoulder.  Not moving the shoulder for a period of time. This can happen if your arm was injured or in a sling.  Long-standing health problems, such as: ? Diabetes. ? Thyroid problems. ? Heart disease. ? Stroke. ? Rheumatoid arthritis. ? Lung disease.  In some cases, the cause may not be known. What increases the risk? This condition is more likely to develop in:  Women.  People who are older than 71 years of age.  What are the signs or symptoms? Symptoms of this condition include:  Pain in the shoulder when moving the arm. There may also be pain when parts of the shoulder are touched. The pain is worse at night or when at rest.  Soreness or aching in the shoulder.  Inability to move the shoulder normally.  Muscle spasms.  How is this diagnosed? This condition is diagnosed with a physical exam and imaging tests, such as an X-ray or MRI. How is this treated? This condition may be treated with:  Treatment of the underlying cause or condition.  Physical therapy. This involves performing exercises to get the shoulder moving again.  Medicine. Medicine may be given to relieve pain, inflammation, or muscle spasms.  Steroid injections into the shoulder joint.  Shoulder manipulation. This is a procedure to move the shoulder into another position. It is done after you are given a medicine to make you fall asleep (general anesthetic). The joint may also be  injected with salt water at high pressure to break down scarring.  Surgery. This may be done in severe cases when other treatments have failed.  Although most people recover completely from adhesive capsulitis, some may not regain the full movement of the shoulder. Follow these instructions at home:  Take over-the-counter and prescription medicines only as told by your health care provider.  If you are being treated with physical therapy, follow instructions from your physical therapist.  Avoid exercises that put a lot of demand on your shoulder, such as throwing. These exercises can make pain worse.  If directed, apply ice to the injured area: ? Put ice in a plastic bag. ? Place a towel between your skin and the bag. ? Leave the ice on for 20 minutes, 2-3 times per day. Contact a health care provider if:  You develop new symptoms.  Your symptoms get worse. This information is not intended to replace advice given to you by your health care provider. Make sure you discuss any questions you have with your health care provider. Document Released: 10/31/2008 Document Revised: 06/11/2015 Document Reviewed: 04/28/2014 Elsevier Interactive Patient Education  2018 [employer].   Shoulder Range of Motion Exercises Shoulder range of motion (ROM) exercises are designed to keep the shoulder moving freely. They are often recommended for people who have shoulder pain. Phase 1 exercises When you are able, do this exercise 5-6 days per week, or as told by your health care provider. Work toward doing 2 sets of 10 swings. Pendulum Exercise How To Do  This Exercise Lying Down 1. Lie face-down on a bed with your abdomen close to the side of the bed. 2. Let your arm hang over the side of the bed. 3. Relax your shoulder, arm, and hand. 4. Slowly and gently swing your arm forward and back. Do not use your neck muscles to swing your arm. They should be relaxed. If you are struggling to swing your arm,  have someone gently swing it for you. When you do this exercise for the first time, swing your arm at a 15 degree angle for 15 seconds, or swing your arm 10 times. As pain lessens over time, increase the angle of the swing to 30-45 degrees. 5. Repeat steps 1-4 with the other arm.  How To Do This Exercise While Standing 1. Stand next to a sturdy chair or table and hold on to it with your hand. 1. Bend forward at the waist. 2. Bend your knees slightly. 3. Relax your other arm and let it hang limp. 4. Relax the shoulder blade of the arm that is hanging and let it drop. 5. While keeping your shoulder relaxed, use body motion to swing your arm in small circles. The first time you do this exercise, swing your arm for about 30 seconds or 10 times. When you do it next time, swing your arm for a little longer. 6. Stand up tall and relax. 7. Repeat steps 1-7, this time changing the direction of the circles. 2. Repeat steps 1-8 with the other arm.  Phase 2 exercises Do these exercises 3-4 times per day on 5-6 days per week or as told by your health care provider. Work toward holding the stretch for 20 seconds. Stretching Exercise 1 1. Lift your arm straight out in front of you. 2. Bend your arm 90 degrees at the elbow (right angle) so your forearm goes across your body and looks like the letter "L." 3. Use your other arm to gently pull the elbow forward and across your body. 4. Repeat steps 1-3 with the other arm. Stretching Exercise 2 You will need a towel or rope for this exercise. 1. Bend one arm behind your back with the palm facing outward. 2. Hold a towel with your other hand. 3. Reach the arm that holds the towel above your head, and bend that arm at the elbow. Your wrist should be behind your neck. 4. Use your free hand to grab the free end of the towel. 5. With the higher hand, gently pull the towel up behind you. 6. With the lower hand, pull the towel down behind you. 7. Repeat steps 1-6  with the other arm.  Phase 3 exercises Do each of these exercises at four different times of day (sessions) every day or as told by your health care provider. To begin with, repeat each exercise 5 times (repetitions). Work toward doing 3 sets of 12 repetitions or as told by your health care provider. Strengthening Exercise 1 You will need a light weight for this activity. As you grow stronger, you may use a heavier weight. 1. Standing with a weight in your hand, lift your arm straight out to the side until it is at the same height as your shoulder. 2. Bend your arm at 90 degrees so that your fingers are pointing to the ceiling. 3. Slowly raise your hand until your arm is straight up in the air. 4. Repeat steps 1-3 with the other arm.  Strengthening Exercise 2 You will need a light  weight for this activity. As you grow stronger, you may use a heavier weight. 1. Standing with a weight in your hand, gradually move your straight arm in an arc, starting at your side, then out in front of you, then straight up over your head. 2. Gradually move your other arm in an arc, starting at your side, then out in front of you, then straight up over your head. 3. Repeat steps 1-2 with the other arm.  Strengthening Exercise 3 You will need an elastic band for this activity. As you grow stronger, gradually increase the size of the bands or increase the number of bands that you use at one time. 1. While standing, hold an elastic band in one hand and raise that arm up in the air. 2. With your other hand, pull down the band until that hand is by your side. 3. Repeat steps 1-2 with the other arm.  This information is not intended to replace advice given to you by your health care provider. Make sure you discuss any questions you have with your health care provider. Document Released: 10/02/2002 Document Revised: 08/30/2015 Document Reviewed: 12/30/2013 Elsevier Interactive Patient Education  2018 [employer].  IF you received an x-ray today, you will receive an invoice from [facility]. Please contact [facility] at [phone] with questions or concerns regarding your invoice.   IF you received labwork today, you will receive an invoice from [place] [facility]. Please contact [facility] at [phone] with questions or concerns regarding your invoice.   Our billing staff will not be able to assist you with questions regarding bills from these companies.  You will be contacted with the lab results as soon as they are available. The fastest way to get your results is to activate your My Chart account. Instructions are located on the last page of this paperwork. If you have not heard from [place] regarding the results in 2 weeks, please contact this office.

## 2017-01-30 NOTE — Progress Notes (Signed)
PRIMARY CARE AT [place], [place] [address] [phone]  Date:  01/30/2017   Name:  Rachel Quinn   DOB:  Feb 26, 1945   MRN:  [id number]  PCP:  Rachel Bachelor, PA    History of Present Illness:  Rachel Quinn is a 72 y.o. female patient who presents to PCP with  Chief Complaint  Patient presents with  . Arm Pain    left arm x 4 days didn't  fall     4 days of left arm pain. Daughter was kicking in bed, and she notes this could be the culprit.   No sleeping in different position.   No hx of falling.   Patient Active Problem List   Diagnosis Date Noted  . 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 Hypertension 04/30/2012    Past Medical History:  Diagnosis Date  . Chronic kidney disease   . Heart murmur   . Hypertension   . Thyroid disease     Past Surgical History:  Procedure Laterality Date  . NEPHRECTOMY Left 01/18/1999   2/2 nephrolithiasis  . THYROIDECTOMY  01/17/2002  . TUBAL LIGATION      Social History   Tobacco Use  . Smoking status: Never Smoker  . Smokeless tobacco: Never Used  Substance Use Topics  . Alcohol use: No    Alcohol/week: 0.0 oz  . Drug use: No    History reviewed. No pertinent family history.  No Known Allergies  Medication list has been reviewed and updated.  Current Outpatient Medications on File Prior to Visit  Medication Sig Dispense Refill  . atenolol-chlorthalidone (TENORETIC) 50-25 MG tablet Take 1 tablet by mouth daily. 90 tablet 2  . Dextromethorphan-Guaifenesin (MUCINEX DM MAXIMUM STRENGTH) 60-1200 MG TB12 Take 1 tablet by mouth 2 (two) times daily. 28 each 1  . levothyroxine (SYNTHROID, LEVOTHROID) 88 MCG tablet Take 1 tablet (88 mcg total) by mouth daily. 90 tablet 1  . ondansetron (ZOFRAN) 4 MG tablet Take 1 tablet (4 mg total) by mouth every 8 (eight) hours as needed for nausea or vomiting. 16 tablet 0  . potassium citrate (UROCIT-K) 10 MEQ (1080 MG) SR tablet TAKE ONE TABLET BY  MOUTH ONCE DAILY 90 tablet 1  . azithromycin (ZITHROMAX) 250 MG tablet Take 2 tabs PO x 1 dose, then 1 tab PO QD x 4 days (Patient not taking: Reported on 01/30/2017) 6 tablet 0  . promethazine-codeine (PHENERGAN WITH CODEINE) 6.25-10 MG/5ML syrup Take 5 mLs by mouth every 6 (six) hours as needed for cough. (Patient not taking: Reported on 01/30/2017) 100 mL 0   No current facility-administered medications on file prior to visit.     ROS ROS otherwise unremarkable unless listed above.  Physical Examination: BP 116/72   Pulse 64   Temp 98 F (36.7 C)   Resp 16   Ht 5\' 1"  (1.549 m)   Wt 163 lb 6.4 oz (74.1 kg)   SpO2 99%   BMI 30.87 kg/m  Ideal Body Weight: Weight in (lb) to have BMI = 25: 132  Physical Exam  Constitutional: She is oriented to person, place, and time. She appears well-developed and well-nourished. No distress.  HENT:  Head: Normocephalic and atraumatic.  Right Ear: External ear normal.  Left Ear: External ear normal.  Eyes: Conjunctivae and EOM are normal. Pupils are equal, round, and reactive to light.  Cardiovascular: Normal rate.  Pulmonary/Chest: Effort normal. No respiratory distress.  Musculoskeletal:       Left shoulder:  She exhibits decreased range of motion, tenderness, bony tenderness and decreased strength. She exhibits no swelling, no spasm and normal pulse.  Neurological: She is alert and oriented to person, place, and time.  Skin: She is not diaphoretic.  Psychiatric: She has a normal mood and affect. Her behavior is normal.    Dg Chest 2 View  Result Date: 01/05/2017 CLINICAL DATA:  Productive cough EXAM: CHEST  2 VIEW COMPARISON:  04/02/2014 FINDINGS: Heart is upper limits normal in size. Peribronchial thickening. No confluent opacities or effusions. No acute bony abnormality. Diffuse aortic calcifications. IMPRESSION: Bronchitic changes. Electronically Signed   By: Rolm Baptise M.D.   On: 01/05/2017 10:46   Dg Shoulder Left  Result Date:  01/30/2017 CLINICAL DATA:  Left shoulder pain and decreased range of motion for 4 days. No known injury. EXAM: LEFT SHOULDER - 2+ VIEW COMPARISON:  None. FINDINGS: No acute bony or joint abnormality is identified. Moderate acromioclavicular osteoarthritis is noted. Imaged left lung and ribs appear clear. Aortic atherosclerosis is seen. IMPRESSION: No acute abnormality. Moderate acromioclavicular osteoarthritis. Atherosclerosis. Electronically Signed   By: Inge Rise M.D.   On: 01/30/2017 15:11     Assessment and Plan: Rachel Quinn is a 72 y.o. female who is here today for cc of  Chief Complaint  Patient presents with  . Arm Pain    left arm x 4 days didn't  fall  stretch and ice regimen discussed.  Will start prednisone tomorrow. Follow up in 2 weeks.  Adhesive capsulitis of left shoulder  Acute pain of left shoulder - Plan: DG Shoulder Left  Rachel Drape, PA-C [facility] 2/5/201911:46 AM

## 2017-04-10 ENCOUNTER — Other Ambulatory Visit: Payer: Self-pay

## 2017-04-10 ENCOUNTER — Encounter: Payer: Self-pay | Admitting: Physician Assistant

## 2017-04-10 ENCOUNTER — Encounter (INDEPENDENT_AMBULATORY_CARE_PROVIDER_SITE_OTHER): Payer: Medicare Other | Admitting: Physician Assistant

## 2017-04-10 NOTE — Patient Instructions (Signed)
     IF you received an x-ray today, you will receive an invoice from [facility]. Please contact [facility] at [phone] with questions or concerns regarding your invoice.   IF you received labwork today, you will receive an invoice from [facility]. Please contact [facility] at [phone] with questions or concerns regarding your invoice.   Our billing staff will not be able to assist you with questions regarding bills from these companies.  You will be contacted with the lab results as soon as they are available. The fastest way to get your results is to activate your My Chart account. Instructions are located on the last page of this paperwork. If you have not heard from us regarding the results in 2 weeks, please contact this office.     

## 2017-04-11 NOTE — Progress Notes (Signed)
Patient was not seen here today.  She is able to obtain eye appt.

## 2017-04-19 ENCOUNTER — Encounter: Payer: Self-pay | Admitting: Physician Assistant

## 2017-05-29 ENCOUNTER — Other Ambulatory Visit: Payer: Self-pay | Admitting: Physician Assistant

## 2017-05-29 DIAGNOSIS — I10 Essential (primary) hypertension: Secondary | ICD-10-CM

## 2017-05-29 DIAGNOSIS — E876 Hypokalemia: Secondary | ICD-10-CM

## 2017-06-13 ENCOUNTER — Ambulatory Visit (INDEPENDENT_AMBULATORY_CARE_PROVIDER_SITE_OTHER): Payer: Medicare Other | Admitting: Physician Assistant

## 2017-06-13 ENCOUNTER — Encounter: Payer: Self-pay | Admitting: Physician Assistant

## 2017-06-13 ENCOUNTER — Ambulatory Visit: Payer: Medicare Other | Admitting: Physician Assistant

## 2017-06-13 ENCOUNTER — Other Ambulatory Visit: Payer: Self-pay

## 2017-06-13 VITALS — BP 122/70 | HR 61 | Temp 98.0°F | Resp 18 | Wt 160.0 lb

## 2017-06-13 DIAGNOSIS — E890 Postprocedural hypothyroidism: Secondary | ICD-10-CM | POA: Diagnosis not present

## 2017-06-13 DIAGNOSIS — Z1159 Encounter for screening for other viral diseases: Secondary | ICD-10-CM | POA: Diagnosis not present

## 2017-06-13 DIAGNOSIS — E876 Hypokalemia: Secondary | ICD-10-CM | POA: Diagnosis not present

## 2017-06-13 DIAGNOSIS — I10 Essential (primary) hypertension: Secondary | ICD-10-CM | POA: Diagnosis not present

## 2017-06-13 MED ORDER — ATENOLOL-CHLORTHALIDONE 50-25 MG PO TABS: 1.0000 | ORAL_TABLET | Freq: Every day | ORAL | 1 refills | Status: DC

## 2017-06-13 MED ORDER — POTASSIUM CITRATE ER 10 MEQ (1080 MG) PO TBCR: 10.0000 meq | EXTENDED_RELEASE_TABLET | Freq: Every day | ORAL | 1 refills | Status: DC

## 2017-06-13 NOTE — Patient Instructions (Addendum)
Get [facility] to send me the record of your pneumonia vaccine - you need 2 that way I can give you the other one.    IF you received an x-ray today, you will receive an invoice from [facility]. Please contact [facility] at [phone] with questions or concerns regarding your invoice.   IF you received labwork today, you will receive an invoice from [facility]. Please contact [facility] at [phone] with questions or concerns regarding your invoice.   Our billing staff will not be able to assist you with questions regarding bills from these companies.  You will be contacted with the lab results as soon as they are available. The fastest way to get your results is to activate your My Chart account. Instructions are located on the last page of this paperwork. If you have not heard from [place] regarding the results in 2 weeks, please contact this office.

## 2017-06-13 NOTE — Progress Notes (Signed)
Rachel Quinn  MRN: [id number] DOB: 29-Sep-1945  PCP: Joretta Bachelor, PA  Chief Complaint  Patient presents with  . Hypothyroidism    follow-up   . Hypertension    Subjective:  Pt presents to clinic for medication refill.  She takes her medication daily.  She did not have any synthroid today as she has run out.  She is not interested in screen exams - breast, bone density, or colon - she feels like she is to old and does not want them done.  History is obtained by patient.  Review of Systems  Constitutional: Negative for chills and fever.  Eyes: Negative for visual disturbance.  Respiratory: Negative for shortness of breath.   Cardiovascular: Negative for chest pain, palpitations and leg swelling.  Endocrine: Negative for cold intolerance and heat intolerance.  Neurological: Negative for dizziness, light-headedness and headaches.    Patient Active Problem List   Diagnosis Date Noted  . 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 Hypertension 04/30/2012    Current Outpatient Medications on File Prior to Visit  Medication Sig Dispense Refill  . levothyroxine (SYNTHROID, LEVOTHROID) 88 MCG tablet Take 1 tablet (88 mcg total) by mouth daily. 90 tablet 1   No current facility-administered medications on file prior to visit.     No Known Allergies  Past Medical History:  Diagnosis Date  . Chronic kidney disease   . Heart murmur   . Hypertension   . Thyroid disease    Social History   Social History Narrative  . Not on file   Social History   Tobacco Use  . Smoking status: Never Smoker  . Smokeless tobacco: Never Used  Substance Use Topics  . Alcohol use: No    Alcohol/week: 0.0 oz  . Drug use: No   family history is not on file.     Objective:  BP 122/70   Pulse 61   Temp 98 F (36.7 C) (Oral)   Resp 18   Wt 160 lb (72.6 kg)   SpO2 95%   BMI 30.23 kg/m  Body mass index is 30.23 kg/m.  Physical Exam  Constitutional: She is  oriented to person, place, and time. She appears well-developed and well-nourished.  HENT:  Head: Normocephalic and atraumatic.  Right Ear: Hearing and external ear normal.  Left Ear: Hearing and external ear normal.  Eyes: Conjunctivae are normal.  Neck: Normal range of motion. No thyromegaly present.  Cardiovascular: Normal rate, regular rhythm and normal heart sounds.  No murmur heard. Pulmonary/Chest: Effort normal and breath sounds normal. She has no wheezes.  Musculoskeletal:       Right lower leg: She exhibits no edema.       Left lower leg: She exhibits no edema.  Neurological: She is alert and oriented to person, place, and time.  Skin: Skin is warm and dry.  Psychiatric: She has a normal mood and affect. Her behavior is normal. Judgment and thought content normal.  Vitals reviewed.   Assessment and Plan :  Hypothyroidism, postsurgical - Plan: TSH, T4, Free - check labs refill medication as indicated by lab results  Essential hypertension - Plan: atenolol-chlorthalidone (TENORETIC) 50-25 MG tablet, potassium citrate (UROCIT-K) 10 MEQ (1080 MG) SR tablet - well controlled - needs labs every 3 months for potassium check  Hypokalemia - Plan: potassium citrate (UROCIT-K) 10 MEQ (1080 MG) SR tablet, CMP14+EGFR - check labs   Encounter for hepatitis C screening test for low risk patient - Plan: HCV Ab w Reflex  to Quant PCR - pt agreed to this screening  Pt will get [facility] to send [place] pneumonia vaccine record as when we called they did not have it in their system but the patient is sure that she paid for it.  We discussed that she needs 2 and we need to figure out which one she had.  Windell Hummingbird PA-C  [facility] 06/13/2017 4:50 PM

## 2017-06-14 LAB — CMP14+EGFR
A/G RATIO: 1.3 (ref 1.2–2.2)
ALK PHOS: 72 IU/L (ref 39–117)
ALT: 17 IU/L (ref 0–32)
AST: 18 IU/L (ref 0–40)
Albumin: 3.9 g/dL (ref 3.5–4.8)
BILIRUBIN TOTAL: 0.9 mg/dL (ref 0.0–1.2)
BUN / CREAT RATIO: 22 (ref 12–28)
BUN: 19 mg/dL (ref 8–27)
CHLORIDE: 100 mmol/L (ref 96–106)
CO2: 26 mmol/L (ref 20–29)
Calcium: 8.8 mg/dL (ref 8.7–10.3)
Creatinine, Ser: 0.87 mg/dL (ref 0.57–1.00)
GFR calc Af Amer: 78 mL/min/{1.73_m2} (ref >59)
GFR calc non Af Amer: 68 mL/min/{1.73_m2} (ref >59)
GLOBULIN, TOTAL: 2.9 g/dL (ref 1.5–4.5)
Glucose: 85 mg/dL (ref 65–99)
POTASSIUM: 3.9 mmol/L (ref 3.5–5.2)
Sodium: 142 mmol/L (ref 134–144)
Total Protein: 6.8 g/dL (ref 6.0–8.5)

## 2017-06-14 LAB — HCV INTERPRETATION

## 2017-06-14 LAB — T4, FREE: FREE T4: 1.29 ng/dL (ref 0.82–1.77)

## 2017-06-14 LAB — HCV AB W REFLEX TO QUANT PCR: HCV Ab: <0.1 s/co ratio (ref 0.0–0.9)

## 2017-06-14 LAB — TSH: TSH: 0.406 u[IU]/mL — AB (ref 0.450–4.500)

## 2017-06-16 ENCOUNTER — Encounter: Payer: Self-pay | Admitting: Radiology

## 2017-07-03 ENCOUNTER — Other Ambulatory Visit: Payer: Self-pay

## 2017-07-03 ENCOUNTER — Telehealth: Payer: Self-pay | Admitting: Physician Assistant

## 2017-07-03 MED ORDER — LEVOTHYROXINE SODIUM 88 MCG PO TABS: 88.0000 ug | ORAL_TABLET | Freq: Every day | ORAL | 0 refills | Status: DC

## 2017-07-03 NOTE — Telephone Encounter (Signed)
Pt hasn't taken medicine today they are at the pharmacy advised them to leave the pharmacy they not understanding she just says to fax medicine

## 2017-07-03 NOTE — Telephone Encounter (Signed)
Copied from [place] [phone]. Topic: Quick Communication - Rx Refill/Question >> Jul 03, 2017  9:33 AM Yvette Rack wrote: Medication: levothyroxine (SYNTHROID, LEVOTHROID) 88 MCG tablet  Has the patient contacted their pharmacy? No.  Out of town at [employer] (Agent: If no, request that the patient contact the pharmacy for the refill.) (Agent: If yes, when and what did the pharmacy advise?)  Preferred Pharmacy (with phone number or street name): [facility], [place] [address]  [phone]  Agent: Please be advised that RX refills may take up to 3 business days. We ask that you follow-up with your pharmacy.

## 2017-09-15 ENCOUNTER — Other Ambulatory Visit: Payer: Self-pay

## 2017-09-15 ENCOUNTER — Encounter: Payer: Self-pay | Admitting: Physician Assistant

## 2017-09-15 ENCOUNTER — Ambulatory Visit (INDEPENDENT_AMBULATORY_CARE_PROVIDER_SITE_OTHER): Payer: Medicare Other | Admitting: Physician Assistant

## 2017-09-15 VITALS — BP 132/60 | HR 57 | Temp 98.3°F | Resp 18 | Ht 61.0 in | Wt 162.0 lb

## 2017-09-15 DIAGNOSIS — I10 Essential (primary) hypertension: Secondary | ICD-10-CM

## 2017-09-15 DIAGNOSIS — Z23 Encounter for immunization: Secondary | ICD-10-CM | POA: Diagnosis not present

## 2017-09-15 DIAGNOSIS — R21 Rash and other nonspecific skin eruption: Secondary | ICD-10-CM

## 2017-09-15 DIAGNOSIS — E876 Hypokalemia: Secondary | ICD-10-CM

## 2017-09-15 DIAGNOSIS — E890 Postprocedural hypothyroidism: Secondary | ICD-10-CM | POA: Diagnosis not present

## 2017-09-15 MED ORDER — LEVOTHYROXINE SODIUM 88 MCG PO TABS: 88.0000 ug | ORAL_TABLET | Freq: Every day | ORAL | 1 refills | Status: DC

## 2017-09-15 MED ORDER — TRIAMCINOLONE ACETONIDE 0.5 % EX CREA: 1.0000 "application " | TOPICAL_CREAM | Freq: Two times a day (BID) | CUTANEOUS | 0 refills | Status: DC

## 2017-09-15 MED ORDER — POTASSIUM CITRATE ER 10 MEQ (1080 MG) PO TBCR: 10.0000 meq | EXTENDED_RELEASE_TABLET | Freq: Every day | ORAL | 1 refills | Status: DC

## 2017-09-15 MED ORDER — ATENOLOL-CHLORTHALIDONE 50-25 MG PO TABS: 1.0000 | ORAL_TABLET | Freq: Every day | ORAL | 1 refills | Status: DC

## 2017-09-15 NOTE — Patient Instructions (Signed)
° ° ° °  If you have lab work done today you will be contacted with your lab results within the next 2 weeks.  If you have not heard from us then please contact us. The fastest way to get your results is to register for My Chart. ° ° °IF you received an x-ray today, you will receive an invoice from [facility]. Please contact [facility] at [phone] with questions or concerns regarding your invoice.  ° °IF you received labwork today, you will receive an invoice from [facility]. Please contact [facility] at [phone] with questions or concerns regarding your invoice.  ° °Our billing staff will not be able to assist you with questions regarding bills from these companies. ° °You will be contacted with the lab results as soon as they are available. The fastest way to get your results is to activate your My Chart account. Instructions are located on the last page of this paperwork. If you have not heard from us regarding the results in 2 weeks, please contact this office. °  ° ° ° °

## 2017-09-15 NOTE — Progress Notes (Signed)
Rachel Quinn  MRN: [id number] DOB: Oct 21, 1945  PCP: Mancel Bale, PA-C  Chief Complaint  Patient presents with  . Hypertension    follow up     Subjective:  Pt presents to clinic for recheck of her HTN and medication refills. She is doing well with her medications.  She is having 2 bumps on her left arm that are itching her.  She did not have a tick bite but does work in the yard a lot.  She does not remember being biten by a bug - she use ETOH based hand sanitizer and that helps the itching but burns when she uses it.    History is obtained by patient.  Review of Systems  Constitutional: Negative for chills and fever.  Eyes: Negative for visual disturbance.  Respiratory: Negative for shortness of breath.   Cardiovascular: Negative for chest pain, palpitations and leg swelling.  Skin: Positive for rash.  Neurological: Negative for dizziness, light-headedness and headaches.    Patient Active Problem List   Diagnosis Date Noted  . 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 Hypertension 04/30/2012    No current outpatient medications on file prior to visit.   No current facility-administered medications on file prior to visit.     No Known Allergies  Past Medical History:  Diagnosis Date  . Chronic kidney disease   . Heart murmur   . Hypertension   . Thyroid disease    Social History   Social History Narrative  . Not on file   Social History   Tobacco Use  . Smoking status: Never Smoker  . Smokeless tobacco: Never Used  Substance Use Topics  . Alcohol use: No    Alcohol/week: 0.0 standard drinks  . Drug use: No   family history is not on file.     Objective:  BP 132/60   Pulse (!) 57   Temp 98.3 F (36.8 C) (Oral)   Resp 18   Ht 5\' 1"  (1.549 m)   Wt 162 lb (73.5 kg)   SpO2 98%   BMI 30.61 kg/m  Body mass index is 30.61 kg/m.  Wt Readings from Last 3 Encounters:  09/15/17 162 lb (73.5 kg)  06/13/17 160 lb (72.6 kg)  04/10/17  166 lb 3.2 oz (75.4 kg)    Physical Exam  Constitutional: She is oriented to person, place, and time. She appears well-developed and well-nourished.  HENT:  Head: Normocephalic and atraumatic.  Right Ear: Hearing and external ear normal.  Left Ear: Hearing and external ear normal.  Eyes: Conjunctivae are normal.  Neck: Normal range of motion.  Cardiovascular: Normal rate, regular rhythm and normal heart sounds.  No murmur heard. Pulmonary/Chest: Effort normal and breath sounds normal.  Musculoskeletal:       Right lower leg: She exhibits no edema.       Left lower leg: She exhibits no edema.  Neurological: She is alert and oriented to person, place, and time.  Skin: Skin is warm and dry. Lesion (scabbed over lesions on her left forearm.) noted.  Psychiatric: She has a normal mood and affect. Her behavior is normal. Judgment and thought content normal.  Vitals reviewed.   Assessment and Plan :  Essential hypertension - Plan: atenolol-chlorthalidone (TENORETIC) 50-25 MG tablet, potassium citrate (UROCIT-K) 10 MEQ (1080 MG) SR tablet - well controlled  Hypokalemia - Plan: potassium citrate (UROCIT-K) 10 MEQ (1080 MG) SR tablet  Hypothyroidism, postsurgical - Plan: levothyroxine (SYNTHROID, LEVOTHROID) 88 MCG tablet  Rash and  nonspecific skin eruption - Plan: triamcinolone cream (KENALOG) 0.5 %  Flu vaccine need - Plan: Flu Vaccine QUAD 36+ mos IM  Need for prophylactic vaccination against Streptococcus pneumoniae (pneumococcus) - Plan: Pneumococcal conjugate vaccine 13-valent IM  Patient verbalized to me that they understand the following: diagnosis, what is being done for them, what to expect and what should be done at home.  Their questions have been answered.  See after visit summary for patient specific instructions.  Windell Hummingbird PA-C  [facility] 09/15/2017 3:55 PM  Please note: Portions of this report may have been transcribed using  dragon voice recognition software. Every effort was made to ensure accuracy; however, inadvertent computerized transcription errors may be present.

## 2017-12-08 ENCOUNTER — Telehealth: Payer: Self-pay | Admitting: Physician Assistant

## 2017-12-08 NOTE — Telephone Encounter (Signed)
Called and spoke with pt regarding their appt on 03/08/18 with Timmothy Euler. Due to [place] no longer being at the practice, we needed to get the appt rescheduled with a new PCP. I was able to get pt rescheduled with Dr. Nolon Rod. I advised of time, building and late policy. Pt acknowledged.

## 2018-03-08 ENCOUNTER — Ambulatory Visit: Payer: Medicare Other | Admitting: Physician Assistant

## 2018-03-09 ENCOUNTER — Ambulatory Visit: Payer: Medicare Other | Admitting: Family Medicine

## 2018-03-15 ENCOUNTER — Ambulatory Visit (INDEPENDENT_AMBULATORY_CARE_PROVIDER_SITE_OTHER): Payer: Medicare Other | Admitting: Family Medicine

## 2018-03-15 ENCOUNTER — Encounter: Payer: Self-pay | Admitting: Family Medicine

## 2018-03-15 VITALS — BP 128/72 | HR 52 | Temp 97.6°F | Ht 60.25 in | Wt 170.5 lb

## 2018-03-15 DIAGNOSIS — Z13220 Encounter for screening for lipoid disorders: Secondary | ICD-10-CM

## 2018-03-15 DIAGNOSIS — I10 Essential (primary) hypertension: Secondary | ICD-10-CM | POA: Diagnosis not present

## 2018-03-15 DIAGNOSIS — E876 Hypokalemia: Secondary | ICD-10-CM

## 2018-03-15 DIAGNOSIS — E890 Postprocedural hypothyroidism: Secondary | ICD-10-CM

## 2018-03-15 DIAGNOSIS — Z905 Acquired absence of kidney: Secondary | ICD-10-CM | POA: Diagnosis not present

## 2018-03-15 MED ORDER — POTASSIUM CITRATE ER 10 MEQ (1080 MG) PO TBCR: 10.0000 meq | EXTENDED_RELEASE_TABLET | Freq: Every day | ORAL | 1 refills | Status: DC

## 2018-03-15 MED ORDER — ATENOLOL-CHLORTHALIDONE 50-25 MG PO TABS: 1.0000 | ORAL_TABLET | Freq: Every day | ORAL | 1 refills | Status: DC

## 2018-03-15 NOTE — Assessment & Plan Note (Signed)
Per patient was placed on potassium following this procedure. Is on low dose so will monitor labs annually.

## 2018-03-15 NOTE — Assessment & Plan Note (Signed)
Repeat TSH as last was low. Medication change based on results

## 2018-03-15 NOTE — Patient Instructions (Signed)
Your blood pressure looks great! Keep taking the medication as prescribed   We will check you thyroid today. If it is normal, then I will refill your current medication.

## 2018-03-15 NOTE — Assessment & Plan Note (Signed)
BP at goal, cont current medications

## 2018-03-15 NOTE — Progress Notes (Signed)
   Subjective:     Rachel Quinn is a 73 y.o. female presenting for Establish Care (previous PCP with [facility] office.)     HPI  #HTN - takes her medication - no issues  #one kidney - no issues - on potassium for this   #Thyroid - unsure when last dose was adjusted - no issues currently  Review of Systems  Constitutional: Negative for chills and fever.  Respiratory: Negative for shortness of breath and wheezing.   Cardiovascular: Negative for chest pain and palpitations.  Neurological: Negative for headaches.     Social History   Tobacco Use  Smoking Status Never Smoker  Smokeless Tobacco Never Used        Objective:    BP Readings from Last 3 Encounters:  03/15/18 128/72  09/15/17 132/60  06/13/17 122/70   Wt Readings from Last 3 Encounters:  03/15/18 170 lb 8 oz (77.3 kg)  09/15/17 162 lb (73.5 kg)  06/13/17 160 lb (72.6 kg)    BP 128/72   Pulse (!) 52   Temp 97.6 F (36.4 C)   Ht 5' 0.25" (1.53 m)   Wt 170 lb 8 oz (77.3 kg)   SpO2 99%   BMI 33.02 kg/m    Physical Exam Constitutional:      General: She is not in acute distress.    Appearance: She is well-developed. She is not diaphoretic.  HENT:     Right Ear: External ear normal.     Left Ear: External ear normal.     Nose: Nose normal.  Eyes:     Conjunctiva/sclera: Conjunctivae normal.  Neck:     Musculoskeletal: Neck supple.  Cardiovascular:     Rate and Rhythm: Normal rate and regular rhythm.     Heart sounds: Murmur present.  Pulmonary:     Effort: Pulmonary effort is normal. No respiratory distress.     Breath sounds: Normal breath sounds. No wheezing.  Skin:    General: Skin is warm and dry.     Capillary Refill: Capillary refill takes less than 2 seconds.  Neurological:     Mental Status: She is alert. Mental status is at baseline.  Psychiatric:        Mood and Affect: Mood normal.        Behavior: Behavior normal.           Assessment & Plan:    Problem List Items Addressed This Visit      Cardiovascular and Mediastinum   Hypertension    BP at goal, cont current medications      Relevant Medications   potassium citrate (UROCIT-K) 10 MEQ (1080 MG) SR tablet   atenolol-chlorthalidone (TENORETIC) 50-25 MG tablet     Endocrine   Hypothyroidism, postsurgical - Primary    Repeat TSH as last was low. Medication change based on results      Relevant Orders   TSH     Other   S/p nephrectomy    Per patient was placed on potassium following this procedure. Is on low dose so will monitor labs annually.        Other Visit Diagnoses    Screening for hyperlipidemia       Relevant Orders   Lipid panel   Hypokalemia       Relevant Medications   potassium citrate (UROCIT-K) 10 MEQ (1080 MG) SR tablet       Return in about 1 year (around 03/16/2019).  Lesleigh Noe, MD

## 2018-03-16 LAB — LIPID PANEL
CHOLESTEROL: 170 mg/dL (ref 0–200)
HDL: 36.7 mg/dL — ABNORMAL LOW (ref >39.00)
NonHDL: 133.66
Total CHOL/HDL Ratio: 5
Triglycerides: 215 mg/dL — ABNORMAL HIGH (ref 0.0–149.0)
VLDL: 43 mg/dL — AB (ref 0.0–40.0)

## 2018-03-16 LAB — TSH: TSH: 0.87 u[IU]/mL (ref 0.35–4.50)

## 2018-03-16 LAB — LDL CHOLESTEROL, DIRECT: Direct LDL: 111 mg/dL

## 2018-03-21 ENCOUNTER — Telehealth: Payer: Self-pay | Admitting: Family Medicine

## 2018-03-21 DIAGNOSIS — E782 Mixed hyperlipidemia: Secondary | ICD-10-CM

## 2018-03-21 DIAGNOSIS — E890 Postprocedural hypothyroidism: Secondary | ICD-10-CM

## 2018-03-21 MED ORDER — LEVOTHYROXINE SODIUM 88 MCG PO TABS: 88.0000 ug | ORAL_TABLET | Freq: Every day | ORAL | 3 refills | Status: DC

## 2018-03-21 MED ORDER — ATORVASTATIN CALCIUM 10 MG PO TABS: 10.0000 mg | ORAL_TABLET | Freq: Every day | ORAL | 3 refills | Status: DC

## 2018-03-21 NOTE — Telephone Encounter (Signed)
Called pt to discuss lab results. Got daughter who has release  Lab Results  Component Value Date   CHOL 170 03/15/2018   HDL 36.70 (L) 03/15/2018   LDLCALC 99 10/29/2015   LDLDIRECT 111.0 03/15/2018   TRIG 215.0 (H) 03/15/2018   CHOLHDL 5 03/15/2018     The 10-year ASCVD risk score Mikey Bussing DC Jr., et al., 2013) is: 14.2%   Values used to calculate the score:     Age: 73 years     Sex: Female     Is Non-Hispanic African American: No     Diabetic: No     Tobacco smoker: No     Systolic Blood Pressure: 337 mmHg     Is BP treated: Yes     HDL Cholesterol: 36.7 mg/dL     Total Cholesterol: 170 mg/dL    Discussed benefit from statin and daughter with discuss with her mother. Said if questions could make an appointment to discuss in details  Thyroid normal, refill sent to pharmacy

## 2018-04-06 ENCOUNTER — Telehealth: Payer: Self-pay

## 2018-04-06 NOTE — Telephone Encounter (Signed)
Spoke with [employer]. Advised that RX for Levothyroxine 88 mcg on 03/21/2018 with refills. Advised daughter to check with pharmacy a little later today and I would call to check. [facility] and was advised this RX was received on 03/21/2018 and is ready. Not sure where the miscommunication or the issue was.

## 2018-04-06 NOTE — Telephone Encounter (Signed)
Cora (DPR signed) left v/m requesting cb about refill of thyroid med to [facility]. Cora said pt has 2 pills left and [facility] advised no reply to refill request..ans

## 2018-08-20 ENCOUNTER — Other Ambulatory Visit: Payer: Self-pay

## 2018-11-11 DIAGNOSIS — Z23 Encounter for immunization: Secondary | ICD-10-CM | POA: Diagnosis not present

## 2019-01-15 ENCOUNTER — Other Ambulatory Visit: Payer: Self-pay

## 2019-01-15 DIAGNOSIS — I10 Essential (primary) hypertension: Secondary | ICD-10-CM

## 2019-01-15 MED ORDER — ATENOLOL-CHLORTHALIDONE 50-25 MG PO TABS: 1.0000 | ORAL_TABLET | Freq: Every day | ORAL | 0 refills | Status: DC

## 2019-01-22 ENCOUNTER — Other Ambulatory Visit: Payer: Self-pay

## 2019-01-22 DIAGNOSIS — E876 Hypokalemia: Secondary | ICD-10-CM

## 2019-01-22 DIAGNOSIS — I10 Essential (primary) hypertension: Secondary | ICD-10-CM

## 2019-01-22 MED ORDER — POTASSIUM CITRATE ER 10 MEQ (1080 MG) PO TBCR: 10.0000 meq | EXTENDED_RELEASE_TABLET | Freq: Every day | ORAL | 1 refills | Status: DC

## 2019-01-22 NOTE — Telephone Encounter (Signed)
Refill request from [facility] for Potassium. Last filled on 03/15/2018 390 with 1 refill. Next appointment on 02/07/2019.

## 2019-01-24 ENCOUNTER — Ambulatory Visit: Payer: Medicare Other | Admitting: Family Medicine

## 2019-02-07 ENCOUNTER — Other Ambulatory Visit: Payer: Self-pay

## 2019-02-07 ENCOUNTER — Encounter: Payer: Self-pay | Admitting: Family Medicine

## 2019-02-07 ENCOUNTER — Ambulatory Visit (INDEPENDENT_AMBULATORY_CARE_PROVIDER_SITE_OTHER): Payer: Medicare HMO | Admitting: Family Medicine

## 2019-02-07 VITALS — BP 126/78 | HR 72 | Temp 98.0°F | Ht 60.25 in | Wt 168.0 lb

## 2019-02-07 DIAGNOSIS — E890 Postprocedural hypothyroidism: Secondary | ICD-10-CM | POA: Diagnosis not present

## 2019-02-07 DIAGNOSIS — M79671 Pain in right foot: Secondary | ICD-10-CM | POA: Diagnosis not present

## 2019-02-07 DIAGNOSIS — E782 Mixed hyperlipidemia: Secondary | ICD-10-CM | POA: Diagnosis not present

## 2019-02-07 DIAGNOSIS — M79672 Pain in left foot: Secondary | ICD-10-CM

## 2019-02-07 DIAGNOSIS — Z905 Acquired absence of kidney: Secondary | ICD-10-CM | POA: Diagnosis not present

## 2019-02-07 DIAGNOSIS — I10 Essential (primary) hypertension: Secondary | ICD-10-CM

## 2019-02-07 NOTE — Assessment & Plan Note (Signed)
Will check labs. Stable. Cont meds

## 2019-02-07 NOTE — Patient Instructions (Signed)
Foot pain - get a shoe with good support - Can try hard sole shoes (like Dansko) - but make sure that you can still move without falling   #blood pressure - looks great, continue taking medication

## 2019-02-07 NOTE — Assessment & Plan Note (Signed)
No abnormal findings on exam. Recommended pt invest in new shoes - current ones seem worn. Return if not improvement

## 2019-02-07 NOTE — Assessment & Plan Note (Signed)
At goal. Cont medication

## 2019-02-07 NOTE — Assessment & Plan Note (Signed)
On atorvastatin, well controled.

## 2019-02-07 NOTE — Progress Notes (Signed)
   Subjective:     Rachel Quinn is a 74 y.o. 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    Appearance: She is well-developed. She is not diaphoretic.  HENT:     Right Ear: External ear normal.     Left Ear: External ear normal.  Eyes:     Conjunctiva/sclera: Conjunctivae normal.  Cardiovascular:     Rate and Rhythm: Normal rate.  Pulmonary:     Effort: Pulmonary effort is normal.  Musculoskeletal:     Cervical back: Neck supple.     Comments: Foot exam:  No lesions Normal ROM No TTP  No skin changes Normal ligament   Skin:    General: Skin is warm and dry.     Capillary Refill: Capillary refill takes less than 2 seconds.  Neurological:     Mental Status: She is alert. Mental status is at baseline.  Psychiatric:        Mood and Affect: Mood normal.        Behavior: Behavior normal.           Assessment & Plan:   Problem List Items Addressed This Visit      Cardiovascular and Mediastinum   Hypertension - Primary    At goal. Cont medication      Relevant Orders   Comprehensive metabolic panel     Endocrine   Hypothyroidism, postsurgical    Will  check labs. Stable. Cont meds      Relevant Orders   TSH     Other   S/p nephrectomy   Relevant Orders   Comprehensive metabolic panel   Mixed hyperlipidemia    On atorvastatin, well controled.       Relevant Orders   Lipid panel   Bilateral foot pain    No abnormal findings on exam. Recommended pt invest in new shoes - current ones seem worn. Return if not improvement          Return in about 1 year (around 02/07/2020).  Lesleigh Noe, MD

## 2019-02-08 ENCOUNTER — Encounter: Payer: Self-pay | Admitting: Family Medicine

## 2019-02-08 LAB — COMPREHENSIVE METABOLIC PANEL
ALT: 16 U/L (ref 0–35)
AST: 20 U/L (ref 0–37)
Albumin: 3.9 g/dL (ref 3.5–5.2)
Alkaline Phosphatase: 71 U/L (ref 39–117)
BUN: 20 mg/dL (ref 6–23)
CO2: 30 mEq/L (ref 19–32)
Calcium: 8.9 mg/dL (ref 8.4–10.5)
Chloride: 101 mEq/L (ref 96–112)
Creatinine, Ser: 0.91 mg/dL (ref 0.40–1.20)
GFR: 60.74 mL/min (ref >60.00)
Glucose, Bld: 86 mg/dL (ref 70–99)
Potassium: 3.8 mEq/L (ref 3.5–5.1)
Sodium: 140 mEq/L (ref 135–145)
Total Bilirubin: 1.4 mg/dL — ABNORMAL HIGH (ref 0.2–1.2)
Total Protein: 6.7 g/dL (ref 6.0–8.3)

## 2019-02-08 LAB — LIPID PANEL
Cholesterol: 119 mg/dL (ref 0–200)
HDL: 36.5 mg/dL — ABNORMAL LOW (ref >39.00)
LDL Cholesterol: 45 mg/dL (ref 0–99)
NonHDL: 82.79
Total CHOL/HDL Ratio: 3
Triglycerides: 191 mg/dL — ABNORMAL HIGH (ref 0.0–149.0)
VLDL: 38.2 mg/dL (ref 0.0–40.0)

## 2019-02-08 LAB — TSH: TSH: 1.67 u[IU]/mL (ref 0.35–4.50)

## 2019-03-13 ENCOUNTER — Telehealth: Payer: Self-pay | Admitting: Family Medicine

## 2019-03-13 DIAGNOSIS — I10 Essential (primary) hypertension: Secondary | ICD-10-CM

## 2019-03-13 DIAGNOSIS — E876 Hypokalemia: Secondary | ICD-10-CM

## 2019-03-13 DIAGNOSIS — E890 Postprocedural hypothyroidism: Secondary | ICD-10-CM

## 2019-03-13 DIAGNOSIS — E782 Mixed hyperlipidemia: Secondary | ICD-10-CM

## 2019-03-13 NOTE — Telephone Encounter (Signed)
Rachel Quinn called to get jan lab results  She stated her mom doesn't know how to do my chart

## 2019-03-14 MED ORDER — ATORVASTATIN CALCIUM 10 MG PO TABS: 10.0000 mg | ORAL_TABLET | Freq: Every day | ORAL | 3 refills | Status: DC

## 2019-03-14 MED ORDER — POTASSIUM CITRATE ER 10 MEQ (1080 MG) PO TBCR: 10.0000 meq | EXTENDED_RELEASE_TABLET | Freq: Every day | ORAL | 3 refills | Status: DC

## 2019-03-14 MED ORDER — LEVOTHYROXINE SODIUM 88 MCG PO TABS: 88.0000 ug | ORAL_TABLET | Freq: Every day | ORAL | 3 refills | Status: DC

## 2019-03-14 MED ORDER — ATENOLOL-CHLORTHALIDONE 50-25 MG PO TABS: 1.0000 | ORAL_TABLET | Freq: Every day | ORAL | 3 refills | Status: DC

## 2019-03-14 NOTE — Telephone Encounter (Signed)
Would recommend continuing for now. Suspect that the low potassium is due to one of her blood pressure medications. We could consider trying different medications to treat blood pressure that would not impact potassium. Would recommend appointment to discuss other options if wanting to switch.

## 2019-03-14 NOTE — Telephone Encounter (Signed)
Cora advised of lab results-ok per DPR on file, also Cora requested refills on her medications to [address]  Dr Einar Pheasant please review if patient is to continue potassium medication. Thank you

## 2019-03-14 NOTE — Telephone Encounter (Signed)
Noted. Rachel Quinn was advised earlier that potassium along with other medications were going to be refilled. She did not have any concerns with continuing Potassium, I just wanted to make sure that was accurate for patient to continue.

## 2019-04-04 ENCOUNTER — Other Ambulatory Visit: Payer: Self-pay

## 2019-04-04 DIAGNOSIS — E890 Postprocedural hypothyroidism: Secondary | ICD-10-CM

## 2019-04-04 DIAGNOSIS — E782 Mixed hyperlipidemia: Secondary | ICD-10-CM

## 2019-04-04 DIAGNOSIS — I10 Essential (primary) hypertension: Secondary | ICD-10-CM

## 2019-04-04 MED ORDER — LEVOTHYROXINE SODIUM 88 MCG PO TABS: 88.0000 ug | ORAL_TABLET | Freq: Every day | ORAL | 3 refills | Status: DC

## 2019-04-04 MED ORDER — ATORVASTATIN CALCIUM 10 MG PO TABS: 10.0000 mg | ORAL_TABLET | Freq: Every day | ORAL | 3 refills | Status: DC

## 2019-04-04 MED ORDER — ATENOLOL-CHLORTHALIDONE 50-25 MG PO TABS: 1.0000 | ORAL_TABLET | Freq: Every day | ORAL | 3 refills | Status: DC

## 2019-04-05 ENCOUNTER — Other Ambulatory Visit: Payer: Self-pay

## 2019-04-05 DIAGNOSIS — I10 Essential (primary) hypertension: Secondary | ICD-10-CM

## 2019-04-05 DIAGNOSIS — E876 Hypokalemia: Secondary | ICD-10-CM

## 2019-04-05 MED ORDER — POTASSIUM CITRATE ER 10 MEQ (1080 MG) PO TBCR: 10.0000 meq | EXTENDED_RELEASE_TABLET | Freq: Every day | ORAL | 3 refills | Status: DC

## 2019-08-06 ENCOUNTER — Telehealth: Payer: Self-pay | Admitting: *Deleted

## 2019-08-06 DIAGNOSIS — E890 Postprocedural hypothyroidism: Secondary | ICD-10-CM

## 2019-08-06 DIAGNOSIS — E876 Hypokalemia: Secondary | ICD-10-CM

## 2019-08-06 DIAGNOSIS — E782 Mixed hyperlipidemia: Secondary | ICD-10-CM

## 2019-08-06 DIAGNOSIS — I10 Essential (primary) hypertension: Secondary | ICD-10-CM

## 2019-08-06 MED ORDER — ATENOLOL-CHLORTHALIDONE 50-25 MG PO TABS: 1.0000 | ORAL_TABLET | Freq: Every day | ORAL | 0 refills | Status: DC

## 2019-08-06 MED ORDER — POTASSIUM CITRATE ER 10 MEQ (1080 MG) PO TBCR: 10.0000 meq | EXTENDED_RELEASE_TABLET | Freq: Every day | ORAL | 0 refills | Status: DC

## 2019-08-06 MED ORDER — LEVOTHYROXINE SODIUM 88 MCG PO TABS: 88.0000 ug | ORAL_TABLET | Freq: Every day | ORAL | 0 refills | Status: DC

## 2019-08-06 MED ORDER — ATORVASTATIN CALCIUM 10 MG PO TABS: 10.0000 mg | ORAL_TABLET | Freq: Every day | ORAL | 0 refills | Status: DC

## 2019-08-06 NOTE — Telephone Encounter (Signed)
[facility] called Triage. They just ordered pt's meds through [facility] but it can take up to 2 weeks to get meds and pt is out. 10 tabs of all 4 meds sent to local pharmacy and family aware

## 2019-11-04 DIAGNOSIS — H40033 Anatomical narrow angle, bilateral: Secondary | ICD-10-CM | POA: Diagnosis not present

## 2019-11-04 DIAGNOSIS — H2513 Age-related nuclear cataract, bilateral: Secondary | ICD-10-CM | POA: Diagnosis not present

## 2019-11-16 ENCOUNTER — Ambulatory Visit: Payer: Self-pay | Attending: Internal Medicine

## 2019-11-16 DIAGNOSIS — Z23 Encounter for immunization: Secondary | ICD-10-CM

## 2019-11-16 NOTE — Progress Notes (Signed)
   Covid-19 Vaccination Clinic  Name:  Rachel Quinn    MRN: [id number] DOB: 1945-12-22  11/16/2019  [name] was observed post Covid-19 immunization for 15 minutes without incident. She was provided with Vaccine Information Sheet and instruction to access the V-Safe system.   [name] was instructed to call 911 with any severe reactions post vaccine: Marland Kitchen Difficulty breathing  . Swelling of face and throat  . A fast heartbeat  . A bad rash all over body  . Dizziness and weakness

## 2019-12-17 DIAGNOSIS — H25043 Posterior subcapsular polar age-related cataract, bilateral: Secondary | ICD-10-CM | POA: Diagnosis not present

## 2019-12-17 DIAGNOSIS — H18413 Arcus senilis, bilateral: Secondary | ICD-10-CM | POA: Diagnosis not present

## 2019-12-17 DIAGNOSIS — H25013 Cortical age-related cataract, bilateral: Secondary | ICD-10-CM | POA: Diagnosis not present

## 2019-12-17 DIAGNOSIS — H2511 Age-related nuclear cataract, right eye: Secondary | ICD-10-CM | POA: Diagnosis not present

## 2019-12-17 DIAGNOSIS — H2513 Age-related nuclear cataract, bilateral: Secondary | ICD-10-CM | POA: Diagnosis not present

## 2020-01-18 HISTORY — PX: CATARACT EXTRACTION: SUR2

## 2020-01-27 DIAGNOSIS — Z961 Presence of intraocular lens: Secondary | ICD-10-CM | POA: Diagnosis not present

## 2020-01-27 DIAGNOSIS — H2513 Age-related nuclear cataract, bilateral: Secondary | ICD-10-CM | POA: Diagnosis not present

## 2020-01-27 DIAGNOSIS — H2511 Age-related nuclear cataract, right eye: Secondary | ICD-10-CM | POA: Diagnosis not present

## 2020-01-28 DIAGNOSIS — H2512 Age-related nuclear cataract, left eye: Secondary | ICD-10-CM | POA: Diagnosis not present

## 2020-02-13 ENCOUNTER — Telehealth: Payer: Self-pay | Admitting: Family Medicine

## 2020-02-13 DIAGNOSIS — E876 Hypokalemia: Secondary | ICD-10-CM

## 2020-02-13 DIAGNOSIS — I10 Essential (primary) hypertension: Secondary | ICD-10-CM

## 2020-02-14 NOTE — Telephone Encounter (Signed)
Patients daughter called stating that they want all her medicine transferred to [facility] [address] in [place]. EM

## 2020-02-15 ENCOUNTER — Other Ambulatory Visit: Payer: Self-pay | Admitting: Family Medicine

## 2020-02-15 DIAGNOSIS — E876 Hypokalemia: Secondary | ICD-10-CM

## 2020-02-15 DIAGNOSIS — I10 Essential (primary) hypertension: Secondary | ICD-10-CM

## 2020-02-15 NOTE — Telephone Encounter (Signed)
Pt needs an appt for medication refills and annual exam. Hasn't been seen in a year. Thank you!

## 2020-02-17 DIAGNOSIS — H2513 Age-related nuclear cataract, bilateral: Secondary | ICD-10-CM | POA: Diagnosis not present

## 2020-02-17 DIAGNOSIS — H2512 Age-related nuclear cataract, left eye: Secondary | ICD-10-CM | POA: Diagnosis not present

## 2020-02-17 DIAGNOSIS — Z961 Presence of intraocular lens: Secondary | ICD-10-CM | POA: Diagnosis not present

## 2020-02-17 NOTE — Telephone Encounter (Signed)
Called patient and lvm to call back.

## 2020-02-18 ENCOUNTER — Other Ambulatory Visit: Payer: Self-pay | Admitting: Family Medicine

## 2020-02-18 DIAGNOSIS — E876 Hypokalemia: Secondary | ICD-10-CM

## 2020-02-18 DIAGNOSIS — I10 Essential (primary) hypertension: Secondary | ICD-10-CM

## 2020-02-18 NOTE — Telephone Encounter (Signed)
Patient called back to schedule CPE can we refill the medication for the patient till them now that she has scheduled? Please advise EM

## 2020-02-18 NOTE — Telephone Encounter (Signed)
Pt's med refilled and sent to requested pharmacy.

## 2020-03-11 ENCOUNTER — Other Ambulatory Visit: Payer: Medicare HMO

## 2020-03-16 DIAGNOSIS — H43393 Other vitreous opacities, bilateral: Secondary | ICD-10-CM | POA: Diagnosis not present

## 2020-03-24 ENCOUNTER — Encounter: Payer: Medicare HMO | Admitting: Family Medicine

## 2020-03-31 ENCOUNTER — Ambulatory Visit (INDEPENDENT_AMBULATORY_CARE_PROVIDER_SITE_OTHER): Payer: Medicare HMO | Admitting: Family Medicine

## 2020-03-31 ENCOUNTER — Other Ambulatory Visit: Payer: Self-pay

## 2020-03-31 VITALS — BP 130/70 | HR 57 | Temp 97.4°F | Ht 60.5 in | Wt 171.5 lb

## 2020-03-31 DIAGNOSIS — I10 Essential (primary) hypertension: Secondary | ICD-10-CM

## 2020-03-31 DIAGNOSIS — Z1211 Encounter for screening for malignant neoplasm of colon: Secondary | ICD-10-CM | POA: Diagnosis not present

## 2020-03-31 DIAGNOSIS — E890 Postprocedural hypothyroidism: Secondary | ICD-10-CM | POA: Diagnosis not present

## 2020-03-31 DIAGNOSIS — R413 Other amnesia: Secondary | ICD-10-CM

## 2020-03-31 DIAGNOSIS — Z0000 Encounter for general adult medical examination without abnormal findings: Secondary | ICD-10-CM

## 2020-03-31 DIAGNOSIS — E876 Hypokalemia: Secondary | ICD-10-CM

## 2020-03-31 DIAGNOSIS — Z289 Immunization not carried out for unspecified reason: Secondary | ICD-10-CM | POA: Diagnosis not present

## 2020-03-31 DIAGNOSIS — E782 Mixed hyperlipidemia: Secondary | ICD-10-CM | POA: Diagnosis not present

## 2020-03-31 DIAGNOSIS — E2839 Other primary ovarian failure: Secondary | ICD-10-CM

## 2020-03-31 DIAGNOSIS — Z23 Encounter for immunization: Secondary | ICD-10-CM

## 2020-03-31 MED ORDER — TETANUS-DIPHTH-ACELL PERTUSSIS 5-2.5-18.5 LF-MCG/0.5 IM SUSY: 0.5000 mL | PREFILLED_SYRINGE | Freq: Once | INTRAMUSCULAR | 0 refills | Status: AC

## 2020-03-31 MED ORDER — ATORVASTATIN CALCIUM 10 MG PO TABS
10.0000 mg | ORAL_TABLET | Freq: Every day | ORAL | 3 refills | Status: DC
Start: 2020-03-31 — End: 2021-05-28

## 2020-03-31 MED ORDER — LEVOTHYROXINE SODIUM 88 MCG PO TABS: 88.0000 ug | ORAL_TABLET | Freq: Every day | ORAL | 3 refills | Status: DC

## 2020-03-31 MED ORDER — POTASSIUM CITRATE ER 10 MEQ (1080 MG) PO TBCR: 10.0000 meq | EXTENDED_RELEASE_TABLET | Freq: Every day | ORAL | 3 refills | Status: DC

## 2020-03-31 MED ORDER — SHINGRIX 50 MCG/0.5ML IM SUSR: 0.5000 mL | Freq: Once | INTRAMUSCULAR | 0 refills | Status: AC

## 2020-03-31 MED ORDER — ATENOLOL-CHLORTHALIDONE 50-25 MG PO TABS: 1.0000 | ORAL_TABLET | Freq: Every day | ORAL | 3 refills | Status: DC

## 2020-03-31 NOTE — Progress Notes (Signed)
Subjective:   Rachel Quinn is a 75 y.o. female who presents for Medicare Annual (Subsequent) preventive examination.  Review of Systems    Review of Systems  Constitutional: Negative for chills and fever.  HENT: Negative for congestion and sore throat.   Eyes: Negative for blurred vision and double vision.  Respiratory: Negative for shortness of breath.   Cardiovascular: Negative for chest pain.  Gastrointestinal: Negative for heartburn, nausea and vomiting.  Genitourinary: Negative.   Musculoskeletal: Negative.  Negative for myalgias.  Skin: Negative for rash.  Neurological: Negative for dizziness and headaches.  Endo/Heme/Allergies: Does not bruise/bleed easily.  Psychiatric/Behavioral: Negative for depression. The patient is not nervous/anxious.        Objective:    Today's Vitals   03/31/20 1519  BP: 130/70  Pulse: (!) 57  Temp: (!) 97.4 F (36.3 C)  TempSrc: Temporal  SpO2: 99%  Weight: 171 lb 8 oz (77.8 kg)  Height: 5' 0.5" (1.537 m)   Body mass index is 32.94 kg/m.  Advanced Directives 03/31/2020  Does Patient Have a Medical Advance Directive? No  Would patient like information on creating a medical advance directive? Yes (MAU/Ambulatory/Procedural Areas - Information given)    Current Medications (verified) Outpatient Encounter Medications as of 03/31/2020  Medication Sig  . calcium carbonate (OS-CAL - DOSED IN MG OF ELEMENTAL CALCIUM) 1250 (500 Ca) MG tablet Take 1 tablet by mouth.  . Tdap (BOOSTRIX) 5-2.5-18.5 LF-MCG/0.5 injection Inject 0.5 mLs into the muscle once for 1 dose.  Marland Kitchen Zoster Vaccine Adjuvanted [facility]) injection Inject 0.5 mLs into the muscle once for 1 dose.  . [DISCONTINUED] atenolol-chlorthalidone (TENORETIC) 50-25 MG tablet Take 1 tablet by mouth daily.  . [DISCONTINUED] atorvastatin (LIPITOR) 10 MG tablet Take 1 tablet (10 mg total) by mouth daily.  . [DISCONTINUED] levothyroxine (SYNTHROID) 88 MCG tablet Take 1 tablet (88  mcg total) by mouth daily.  . [DISCONTINUED] potassium citrate (UROCIT-K) 10 MEQ (1080 MG) SR tablet Take 1 tablet by mouth once daily  . atenolol-chlorthalidone (TENORETIC) 50-25 MG tablet Take 1 tablet by mouth daily.  Marland Kitchen atorvastatin (LIPITOR) 10 MG tablet Take 1 tablet (10 mg total) by mouth daily.  Marland Kitchen levothyroxine (SYNTHROID) 88 MCG tablet Take 1 tablet (88 mcg total) by mouth daily.  . potassium citrate (UROCIT-K) 10 MEQ (1080 MG) SR tablet Take 1 tablet (10 mEq total) by mouth daily.   No facility-administered encounter medications on file as of 03/31/2020.    Allergies (verified) Patient has no known allergies.   History: Past Medical History:  Diagnosis Date  . Chronic kidney disease   . Heart murmur   . Hypertension   . Kidney stone   . Thyroid disease    Past Surgical History:  Procedure Laterality Date  . NEPHRECTOMY Left 01/18/1999   2/2 nephrolithiasis  . THYROIDECTOMY  01/17/2002  . TUBAL LIGATION     Family History  Problem Relation Age of Onset  . Healthy Mother   . Healthy Father   . Cancer Neg Hx   . Heart attack Neg Hx   . Stroke Neg Hx    Social History   Socioeconomic History  . Marital status: Married    Spouse name: Not on file  . Number of children: 9  . Years of education: college  . Highest education level: Not on file  Occupational History  . Not on file  Tobacco Use  . Smoking status: Never Smoker  . Smokeless tobacco: Never Used  Vaping Use  .  Vaping Use: Never used  Substance and Sexual Activity  . Alcohol use: No    Alcohol/week: 0.0 standard drinks  . Drug use: No  . Sexual activity: Not Currently  Other Topics Concern  . Not on file  Social History Narrative   Lives with son [name]) and his family   From the [place], moved to the [place] 20 years ago   Enjoys: spending time with family, church Sunday, 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   Exercise: taking care of her granddaughter (54 years old), walking daily   Diet: rice,  fish, chicken, veggies, fruit   Social Determinants of Health   Financial Resource Strain: Not on file  Food Insecurity: Not on file  Transportation Needs: Not on file  Physical Activity: Not on file  Stress: Not on file  Social Connections: Not on file    Tobacco Counseling Counseling given: Not Answered   Clinical Intake:  Pre-visit preparation completed: No  Pain : No/denies pain     BMI - recorded: 32.9 Nutritional Status: BMI > 30  Obese Nutritional Risks: None Diabetes: No  How often do you need to have someone help you when you read instructions, pamphlets, or other written materials from your doctor or pharmacy?: 1 - Never What is the last grade level you completed in school?: some college  Diabetic?no  Interpreter Needed?: No (pt declined, [place] is first language)      Activities of Daily Living In your present state of health, do you have any difficulty performing the following activities: 03/31/2020  Hearing? N  Vision? N  Difficulty concentrating or making decisions? N  Walking or climbing stairs? N  Dressing or bathing? N  Doing errands, shopping? N  Preparing Food and eating ? N  Using the Toilet? N  In the past six months, have you accidently leaked urine? N  Do you have problems with loss of bowel control? N  Managing your Medications? N  Managing your Finances? N  Housekeeping or managing your Housekeeping? N  Some recent data might be hidden    Patient Care Team: Lesleigh Noe, MD as PCP - General (Family Medicine) Darleen Crocker, MD as Consulting Physician (Ophthalmology)  Indicate any recent Medical Services you may have received from other than [facility] providers in the past year (date may be approximate).     Assessment:   This is a routine wellness examination for Rachel Quinn.  Hearing/Vision screen  Hearing Screening   125Hz  250Hz  500Hz  1000Hz  2000Hz  3000Hz  4000Hz  6000Hz  8000Hz   Right ear:  20 20 20 20  20     Left ear:   20 20 20 20  20     Vision Screening Comments: Last eye exam in December 2021 at [place] issues and exercise activities discussed: Current Exercise Habits: Home exercise routine, Type of exercise: walking, Time (Minutes): 15, Frequency (Times/Week): 7, Weekly Exercise (Minutes/Week): 105, Intensity: Mild, Exercise limited by: None identified  Goals    . Exercise 3x per week (30 min per time)     Hoping to walk 20 minutes per day      Depression Screen PHQ 2/9 Scores 03/31/2020 06/13/2017 04/10/2017 01/30/2017 01/05/2017 02/25/2016 10/29/2015  PHQ - 2 Score 0 0 0 0 0 0 0    Fall Risk Fall Risk  03/31/2020 03/31/2020 08/20/2018 06/13/2017 04/10/2017  Falls in the past year? 0 0 0 No No  Comment - - Emmi Telephone Survey: data to providers prior to load - -  Number falls in  past yr: 0 0 - - -  Injury with Fall? 0 - - - -  Risk for fall due to : No Fall Risks - - - -  Follow up Falls evaluation completed - - - -    FALL RISK PREVENTION PERTAINING TO THE HOME:  Any stairs in or around the home? Yes  If so, are there any without handrails? Yes  Home free of loose throw rugs in walkways, pet beds, electrical cords, etc? Yes  Adequate lighting in your home to reduce risk of falls? Yes   ASSISTIVE DEVICES UTILIZED TO PREVENT FALLS:  Life alert? No  Use of a cane, walker or w/c? No  Grab bars in the bathroom? Yes  Shower chair or bench in shower? Yes  Elevated toilet seat or a handicapped toilet? Yes   Cognitive Function:       Mini-Cog - 03/31/20 1541    Normal clock drawing test? yes    How many words correct? 2              Immunizations Immunization History  Administered Date(s) Administered  . Fluad Quad(high Dose 65+) 12/22/2019  . Influenza Split 11/09/2016  . Influenza, High Dose Seasonal PF 11/11/2018  . Influenza,inj,Quad PF,6+ Mos 09/15/2017  . Pneumococcal Conjugate-13 09/15/2017    TDAP status: Due, Education has been provided regarding the importance  of this vaccine. Advised may receive this vaccine at local pharmacy or Health Dept. Aware to provide a copy of the vaccination record if obtained from local pharmacy or Health Dept. Verbalized acceptance and understanding.  Flu Vaccine status: Up to date  Pneumococcal vaccine status: Up to date  Covid-19 vaccine status: Completed vaccines  Qualifies for Shingles Vaccine? Yes   Zostavax completed No   Shingrix Completed?: Yes  Screening Tests Health Maintenance  Topic Date Due  . COVID-19 Vaccine (1) Never done  . TETANUS/TDAP  Never done  . COLONOSCOPY (Pts 45-52yrs Insurance coverage will need to be confirmed)  Never done  . MAMMOGRAM  Never done  . DEXA SCAN  Never done  . PNA vac Low Risk Adult (2 of 2 - PPSV23) 09/16/2018  . INFLUENZA VACCINE  Completed  . Hepatitis C Screening  Completed  . HPV VACCINES  Aged Out    Health Maintenance  Health Maintenance Due  Topic Date Due  . COVID-19 Vaccine (1) Never done  . TETANUS/TDAP  Never done  . COLONOSCOPY (Pts 45-43yrs Insurance coverage will need to be confirmed)  Never done  . MAMMOGRAM  Never done  . DEXA SCAN  Never done  . PNA vac Low Risk Adult (2 of 2 - PPSV23) 09/16/2018    Colorectal cancer screening: Type of screening: FOBT/FIT. Completed information today. Repeat every 1 years  Mammogram status: Ordered mammogram. Pt provided with contact info and advised to call to schedule appt.   Bone Density status: Ordered dexa. Pt provided with contact info and advised to call to schedule appt.  Lung Cancer Screening: (Low Dose CT Chest recommended if Age 24-80 years, 30 pack-year currently smoking OR have quit w/in 15years.) does not qualify.   Lung Cancer Screening Referral: n/a  Additional Screening:  Hepatitis C Screening: does qualify; declined  Vision Screening: Recommended annual ophthalmology exams for early detection of glaucoma and other disorders of the eye. Is the patient up to date with their annual  eye exam?  Yes  Who is the provider or what is the name of the office in which the patient  attends annual eye exams? [facility] If pt is not established with a provider, would they like to be referred to a provider to establish care? n/a.   Dental Screening: Recommended annual dental exams for proper oral hygiene  Community Resource Referral / Chronic Care Management: CRR required this visit?  No   CCM required this visit?  No      Plan:     Problem List Items Addressed This Visit      Cardiovascular and Mediastinum   Hypertension   Relevant Medications   atenolol-chlorthalidone (TENORETIC) 50-25 MG tablet   potassium citrate (UROCIT-K) 10 MEQ (1080 MG) SR tablet   atorvastatin (LIPITOR) 10 MG tablet     Endocrine   Hypothyroidism, postsurgical   Relevant Medications   levothyroxine (SYNTHROID) 88 MCG tablet   Other Relevant Orders   TSH     Other   Mixed hyperlipidemia   Relevant Medications   atenolol-chlorthalidone (TENORETIC) 50-25 MG tablet   atorvastatin (LIPITOR) 10 MG tablet   Other Relevant Orders   Lipid panel   Memory loss    2/3 recall on minicog. Advised return visit in 6 months with family member. Will check labs today      Relevant Orders   Vitamin B12   CBC    Other Visit Diagnoses    Encounter for Medicare annual wellness exam    -  Primary   Prescription for Shingrix. Vaccine not administered in office.        Relevant Medications   Zoster Vaccine Adjuvanted [facility]) injection   Prescription for Tdap. Vaccine not administered in office.        Relevant Medications   Tdap (BOOSTRIX) 5-2.5-18.5 LF-MCG/0.5 injection   Estrogen deficiency       Relevant Orders   DG Bone Density   Essential hypertension       Relevant Medications   atenolol-chlorthalidone (TENORETIC) 50-25 MG tablet   potassium citrate (UROCIT-K) 10 MEQ (1080 MG) SR tablet   atorvastatin (LIPITOR) 10 MG tablet   Other Relevant Orders   Comprehensive metabolic panel    Hypokalemia       Relevant Medications   potassium citrate (UROCIT-K) 10 MEQ (1080 MG) SR tablet   Other Relevant Orders   Comprehensive metabolic panel   Colon cancer screening       Relevant Orders   Fecal occult blood, imunochemical       I have personally reviewed and noted the following in the patient's chart:   . Medical and social history . Use of alcohol, tobacco or illicit drugs  . Current medications and supplements . Functional ability and status . Nutritional status . Physical activity . Advanced directives . List of other physicians . Hospitalizations, surgeries, and ER visits in previous 12 months . Vitals . Screenings to include cognitive, depression, and falls . Referrals and appointments  In addition, I have reviewed and discussed with patient certain preventive protocols, quality metrics, and best practice recommendations. A written personalized care plan for preventive services as well as general preventive health recommendations were provided to patient.     Lesleigh Noe, MD   03/31/2020

## 2020-03-31 NOTE — Assessment & Plan Note (Signed)
2/3 recall on minicog. Advised return visit in 6 months with family member. Will check labs today

## 2020-03-31 NOTE — Patient Instructions (Addendum)
Return in 6 months for memory check   Go to the pharmacy - recommend TDAP and Shingles vaccine - you can get these at the pharmacy   Please call the location of your choice from the menu below to schedule your Mammogram and/or Bone Density appointment.    [place]   [facility] [facility]                      Phone:  [phone]                               [place], [place] [address]                                                            Services: Traditional and 3D Mammogram, Bone Density

## 2020-04-01 ENCOUNTER — Other Ambulatory Visit: Payer: Self-pay | Admitting: Family Medicine

## 2020-04-01 DIAGNOSIS — E890 Postprocedural hypothyroidism: Secondary | ICD-10-CM

## 2020-04-01 LAB — CBC
HCT: 40.8 % (ref 36.0–46.0)
Hemoglobin: 14 g/dL (ref 12.0–15.0)
MCHC: 34.3 g/dL (ref 30.0–36.0)
MCV: 92.3 fl (ref 78.0–100.0)
Platelets: 212 10*3/uL (ref 150.0–400.0)
RBC: 4.42 Mil/uL (ref 3.87–5.11)
RDW: 14.1 % (ref 11.5–15.5)
WBC: 8.2 10*3/uL (ref 4.0–10.5)

## 2020-04-01 LAB — VITAMIN B12: Vitamin B-12: 491 pg/mL (ref 211–911)

## 2020-04-01 LAB — COMPREHENSIVE METABOLIC PANEL
ALT: 19 U/L (ref 0–35)
AST: 19 U/L (ref 0–37)
Albumin: 3.9 g/dL (ref 3.5–5.2)
Alkaline Phosphatase: 76 U/L (ref 39–117)
BUN: 23 mg/dL (ref 6–23)
CO2: 31 mEq/L (ref 19–32)
Calcium: 9.1 mg/dL (ref 8.4–10.5)
Chloride: 97 mEq/L (ref 96–112)
Creatinine, Ser: 0.94 mg/dL (ref 0.40–1.20)
GFR: 60.28 mL/min (ref >60.00)
Glucose, Bld: 86 mg/dL (ref 70–99)
Potassium: 4.2 mEq/L (ref 3.5–5.1)
Sodium: 135 mEq/L (ref 135–145)
Total Bilirubin: 1.5 mg/dL — ABNORMAL HIGH (ref 0.2–1.2)
Total Protein: 7 g/dL (ref 6.0–8.3)

## 2020-04-01 LAB — LDL CHOLESTEROL, DIRECT: Direct LDL: 60 mg/dL

## 2020-04-01 LAB — TSH: TSH: 0.2 u[IU]/mL — ABNORMAL LOW (ref 0.35–4.50)

## 2020-04-01 LAB — LIPID PANEL
Cholesterol: 113 mg/dL (ref 0–200)
HDL: 38.4 mg/dL — ABNORMAL LOW (ref >39.00)
NonHDL: 74.18
Total CHOL/HDL Ratio: 3
Triglycerides: 240 mg/dL — ABNORMAL HIGH (ref 0.0–149.0)
VLDL: 48 mg/dL — ABNORMAL HIGH (ref 0.0–40.0)

## 2020-04-01 MED ORDER — LEVOTHYROXINE SODIUM 75 MCG PO TABS: 75.0000 ug | ORAL_TABLET | Freq: Every day | ORAL | 3 refills | Status: DC

## 2020-04-08 ENCOUNTER — Other Ambulatory Visit: Payer: Self-pay | Admitting: Family Medicine

## 2020-04-08 ENCOUNTER — Other Ambulatory Visit (INDEPENDENT_AMBULATORY_CARE_PROVIDER_SITE_OTHER): Payer: Medicare HMO

## 2020-04-08 ENCOUNTER — Telehealth: Payer: Self-pay

## 2020-04-08 DIAGNOSIS — R195 Other fecal abnormalities: Secondary | ICD-10-CM

## 2020-04-08 DIAGNOSIS — Z1211 Encounter for screening for malignant neoplasm of colon: Secondary | ICD-10-CM

## 2020-04-08 LAB — FECAL OCCULT BLOOD, IMMUNOCHEMICAL: Fecal Occult Bld: POSITIVE — AB

## 2020-04-08 NOTE — Telephone Encounter (Signed)
[facility] called critical results @ 9923  Positive IFOB

## 2020-04-08 NOTE — Telephone Encounter (Signed)
Notified family and placed referral

## 2020-04-16 ENCOUNTER — Other Ambulatory Visit: Payer: Self-pay | Admitting: Family Medicine

## 2020-04-16 DIAGNOSIS — E2839 Other primary ovarian failure: Secondary | ICD-10-CM

## 2020-05-20 ENCOUNTER — Other Ambulatory Visit (INDEPENDENT_AMBULATORY_CARE_PROVIDER_SITE_OTHER): Payer: Medicare HMO

## 2020-05-20 ENCOUNTER — Other Ambulatory Visit: Payer: Self-pay

## 2020-05-20 DIAGNOSIS — E890 Postprocedural hypothyroidism: Secondary | ICD-10-CM | POA: Diagnosis not present

## 2020-05-21 LAB — TSH: TSH: 1.27 u[IU]/mL (ref 0.35–4.50)

## 2020-08-18 ENCOUNTER — Encounter: Payer: Self-pay | Admitting: Physician Assistant

## 2020-09-18 ENCOUNTER — Ambulatory Visit: Payer: Medicare HMO | Admitting: Physician Assistant

## 2020-09-18 ENCOUNTER — Encounter: Payer: Self-pay | Admitting: Physician Assistant

## 2020-09-18 VITALS — BP 130/70 | HR 53 | Ht 60.5 in | Wt 163.2 lb

## 2020-09-18 DIAGNOSIS — R195 Other fecal abnormalities: Secondary | ICD-10-CM | POA: Diagnosis not present

## 2020-09-18 MED ORDER — PEG 3350-KCL-NA BICARB-NACL 420 G PO SOLR: 4000.0000 mL | Freq: Once | ORAL | 0 refills | Status: AC

## 2020-09-18 NOTE — Patient Instructions (Signed)
You have been scheduled for a colonoscopy. Please follow written instructions given to you at your visit today.  Please pick up your prep supplies at the pharmacy within the next 1-3 days. If you use inhalers (even only as needed), please bring them with you on the day of your procedure.  If you are age 74 or older, your body mass index should be between 23-30. Your Body mass index is 31.36 kg/m. If this is out of the aforementioned range listed, please consider follow up with your Primary Care Provider.  If you are age 61 or younger, your body mass index should be between 19-25. Your Body mass index is 31.36 kg/m. If this is out of the aformentioned range listed, please consider follow up with your Primary Care Provider.   __________________________________________________________  The [facility] providers would like to encourage you to use [facility] to communicate with providers for non-urgent requests or questions.  Due to long hold times on the telephone, sending your provider a message by [facility] may be a faster and more efficient way to get a response.  Please allow 48 business hours for a response.  Please remember that this is for non-urgent requests.

## 2020-09-18 NOTE — Progress Notes (Signed)
Chief Complaint: Positive FOBT  HPI:    Rachel Quinn is a 75 year old female with a past medical history as listed below including hypothyroidism, hypertension and others including CKD s/p unilateral nephrectomy in 2000, who was referred to me by Lesleigh Noe, MD for a complaint of positive FOBT of stool.      03/31/2020 normal CBC, CMP with minimally elevated bilirubin at 1.5 (chronically elevated).    04/08/20 fecal occult positive.    Today, patient presents to clinic accompanied by her daughter who does assist with interpreting at times throughout the exam and with her history.  Explains that she is doing well, no complaints at all but had her regular checkup with her doctor and was told that she had blood in her stool.  Describes a colonoscopy maybe 20 years ago which was normal per her recollection.    Denies fever, chills, weight loss, change in bowel habits, abdominal pain, heartburn or reflux.  Past Medical History:  Diagnosis Date   Chronic kidney disease    Heart murmur    Hypertension    Kidney stone    Thyroid disease     Past Surgical History:  Procedure Laterality Date   NEPHRECTOMY Left 01/18/1999   2/2 nephrolithiasis   THYROIDECTOMY  01/17/2002   TUBAL LIGATION      Current Outpatient Medications  Medication Sig Dispense Refill   atenolol-chlorthalidone (TENORETIC) 50-25 MG tablet Take 1 tablet by mouth daily. 90 tablet 3   atorvastatin (LIPITOR) 10 MG tablet Take 1 tablet (10 mg total) by mouth daily. 90 tablet 3   calcium carbonate (OS-CAL - DOSED IN MG OF ELEMENTAL CALCIUM) 1250 (500 Ca) MG tablet Take 1 tablet by mouth.     levothyroxine (SYNTHROID) 75 MCG tablet Take 1 tablet (75 mcg total) by mouth daily. 90 tablet 3   potassium citrate (UROCIT-K) 10 MEQ (1080 MG) SR tablet Take 1 tablet (10 mEq total) by mouth daily. 90 tablet 3   No current facility-administered medications for this visit.    Allergies as of 09/18/2020   (No Known Allergies)     Family History  Problem Relation Age of Onset   Healthy Mother    Healthy Father    Cancer Neg Hx    Heart attack Neg Hx    Stroke Neg Hx     Social History   Socioeconomic History   Marital status: Married    Spouse name: Not on file   Number of children: 9   Years of education: college   Highest education level: Not on file  Occupational History   Not on file  Tobacco Use   Smoking status: Never   Smokeless tobacco: Never  Vaping Use   Vaping Use: Never used  Substance and Sexual Activity   Alcohol use: No    Alcohol/week: 0.0 standard drinks   Drug use: No   Sexual activity: Not Currently  Other Topics Concern   Not on file  Social History Narrative   Lives with son [name]) and his family   From the [place], moved to the [place] 20 years ago   Enjoys: spending time with family, church Sunday, 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   Exercise: taking care of her granddaughter (85 years old), walking daily   Diet: rice, fish, chicken, veggies, fruit   Social Determinants of Health   Financial Resource Strain: Not on file  Food Insecurity: Not on file  Transportation Needs: Not on file  Physical Activity: Not on  file  Stress: Not on file  Social Connections: Not on file  Intimate Partner Violence: Not on file    Review of Systems:    Constitutional: No weight loss, fever or chills Skin: No rash  Cardiovascular: No chest pain Respiratory: No SOB  Gastrointestinal: See HPI and otherwise negative Genitourinary: No dysuria  Neurological: No headache, dizziness or syncope Musculoskeletal: No new muscle or joint pain Hematologic: No bleeding Psychiatric: No history of depression or anxiety   Physical Exam:  Vital signs: BP 130/70   Pulse (!) 53   Ht 5' 0.5" (1.537 m)   Wt 163 lb 4 oz (74 kg)   BMI 31.36 kg/m    Constitutional:   Pleasant female appears to be in NAD, Well developed, Well nourished, alert and cooperative Head:  Normocephalic and  atraumatic. Eyes:   PEERL, EOMI. No icterus. Conjunctiva pink. Ears:  Normal auditory acuity. Neck:  Supple Throat: Oral cavity and pharynx without inflammation, swelling or lesion.  Respiratory: Respirations even and unlabored. Lungs clear to auscultation bilaterally.   No wheezes, crackles, or rhonchi.  Cardiovascular: Normal S1, S2. No MRG. Regular rate and rhythm. No peripheral edema, cyanosis or pallor.  Gastrointestinal:  Soft, nondistended, nontender. No rebound or guarding. Normal bowel sounds. No appreciable masses or hepatomegaly. Rectal:  Not performed.  Msk:  Symmetrical without gross deformities. Without edema, no deformity or joint abnormality.  Neurologic:  Alert and  oriented x4;  grossly normal neurologically.  Skin:   Dry and intact without significant lesions or rashes. Psychiatric:  Demonstrates good judgement and reason without abnormal affect or behaviors.  RELEVANT LABS AND IMAGING: CBC    Component Value Date/Time   WBC 8.2 03/31/2020 1602   RBC 4.42 03/31/2020 1602   HGB 14.0 03/31/2020 1602   HCT 40.8 03/31/2020 1602   PLT 212.0 03/31/2020 1602   MCV 92.3 03/31/2020 1602   MCV 93.9 01/05/2017 1056   MCH 30.6 01/05/2017 1056   MCH 31.0 10/29/2015 0848   MCHC 34.3 03/31/2020 1602   RDW 14.1 03/31/2020 1602    CMP     Component Value Date/Time   NA 135 03/31/2020 1602   NA 142 06/13/2017 1720   K 4.2 03/31/2020 1602   CL 97 03/31/2020 1602   CO2 31 03/31/2020 1602   GLUCOSE 86 03/31/2020 1602   BUN 23 03/31/2020 1602   BUN 19 06/13/2017 1720   CREATININE 0.94 03/31/2020 1602   CREATININE 0.87 10/29/2015 0848   CALCIUM 9.1 03/31/2020 1602   PROT 7.0 03/31/2020 1602   PROT 6.8 06/13/2017 1720   ALBUMIN 3.9 03/31/2020 1602   ALBUMIN 3.9 06/13/2017 1720   AST 19 03/31/2020 1602   ALT 19 03/31/2020 1602   ALKPHOS 76 03/31/2020 1602   BILITOT 1.5 (H) 03/31/2020 1602   BILITOT 0.9 06/13/2017 1720   GFRNONAA 68 06/13/2017 1720   GFRNONAA 66  06/01/2013 2033   GFRAA 78 06/13/2017 1720   GFRAA 76 06/01/2013 2033    Assessment: 1.  I FOBT positive stool: In March with PCP, last colonoscopy 20 years ago was normal per the patient's recollection  Plan: 1.  Scheduled patient for a diagnostic colonoscopy in the [place] with Dr. Bryan Lemma.  Did provide the patient a detailed list of risks for the procedure and she agrees to proceed. 2.  Patient to follow in clinic per recommendations from Dr. Bryan Lemma after time of procedure.  Ellouise Newer, PA-C [facility] 09/18/2020, 2:59 PM  Cc: Lesleigh Noe, MD

## 2020-09-25 ENCOUNTER — Other Ambulatory Visit: Payer: Medicare HMO

## 2020-10-02 NOTE — Progress Notes (Signed)
Agree with the assessment and plan as outlined by Jennifer Lemmon, PA-C. ? ?Hannibal Skalla, DO, FACG ? ?

## 2020-11-03 ENCOUNTER — Encounter: Payer: Medicare HMO | Admitting: Gastroenterology

## 2020-11-18 ENCOUNTER — Ambulatory Visit (AMBULATORY_SURGERY_CENTER): Payer: Medicare HMO | Admitting: Gastroenterology

## 2020-11-18 ENCOUNTER — Encounter: Payer: Self-pay | Admitting: Gastroenterology

## 2020-11-18 VITALS — BP 106/61 | HR 72 | Temp 97.7°F | Resp 18 | Ht 60.5 in | Wt 163.0 lb

## 2020-11-18 DIAGNOSIS — K641 Second degree hemorrhoids: Secondary | ICD-10-CM | POA: Diagnosis not present

## 2020-11-18 DIAGNOSIS — D125 Benign neoplasm of sigmoid colon: Secondary | ICD-10-CM | POA: Diagnosis not present

## 2020-11-18 DIAGNOSIS — D123 Benign neoplasm of transverse colon: Secondary | ICD-10-CM

## 2020-11-18 DIAGNOSIS — K5730 Diverticulosis of large intestine without perforation or abscess without bleeding: Secondary | ICD-10-CM

## 2020-11-18 DIAGNOSIS — I10 Essential (primary) hypertension: Secondary | ICD-10-CM | POA: Diagnosis not present

## 2020-11-18 DIAGNOSIS — D124 Benign neoplasm of descending colon: Secondary | ICD-10-CM | POA: Diagnosis not present

## 2020-11-18 DIAGNOSIS — D374 Neoplasm of uncertain behavior of colon: Secondary | ICD-10-CM | POA: Diagnosis not present

## 2020-11-18 DIAGNOSIS — R195 Other fecal abnormalities: Secondary | ICD-10-CM | POA: Diagnosis not present

## 2020-11-18 DIAGNOSIS — E785 Hyperlipidemia, unspecified: Secondary | ICD-10-CM | POA: Diagnosis not present

## 2020-11-18 MED ORDER — SODIUM CHLORIDE 0.9 % IV SOLN: 500.0000 mL | Freq: Once | INTRAVENOUS | Status: DC

## 2020-11-18 NOTE — Op Note (Signed)
[facility] Patient Name: Rachel Quinn Procedure Date: 11/18/2020 12:05 PM MRN: [id number] Endoscopist: Gerrit Heck , MD Age: 75 Referring MD:  Date of Birth: 1945/03/29 Gender: Female Account #: [id number] Procedure:                Colonoscopy Indications:              Heme positive stool Medicines:                Monitored Anesthesia Care Procedure:                Pre-Anesthesia Assessment:                           - Prior to the procedure, a History and Physical                            was performed, and patient medications and                            allergies were reviewed. The patient's tolerance of                            previous anesthesia was also reviewed. The risks                            and benefits of the procedure and the sedation                            options and risks were discussed with the patient.                            All questions were answered, and informed consent                            was obtained. Prior Anticoagulants: The patient has                            taken no previous anticoagulant or antiplatelet                            agents. ASA Grade Assessment: II - A patient with                            mild systemic disease. After reviewing the risks                            and benefits, the patient was deemed in                            satisfactory condition to undergo the procedure.                           After obtaining informed consent, the colonoscope  was passed under direct vision. Throughout the                            procedure, the patient's blood pressure, pulse, and                            oxygen saturations were monitored continuously. The                            CF [id number] #[id number] was introduced through the anus                            and advanced to the the cecum, identified by                            appendiceal orifice and ileocecal  valve. The                            colonoscopy was performed without difficulty. The                            patient tolerated the procedure well. The quality                            of the bowel preparation was good. The ileocecal                            valve, appendiceal orifice, and rectum were                            photographed. Scope In: 12:24:57 PM Scope Out: 12:43:49 PM Scope Withdrawal Time: 0 hours 16 minutes 8 seconds  Total Procedure Duration: 0 hours 18 minutes 52 seconds  Findings:                 Hemorrhoids were found on perianal exam.                           Four sessile polyps were found in the sigmoid                            colon, descending colon and hepatic flexure. The                            polyps were 3 to 6 mm in size. These polyps were                            removed with a cold snare. Resection and retrieval                            were complete. Estimated blood loss was minimal.                           Multiple small and large-mouthed diverticula were  found in the sigmoid colon.                           Non-bleeding internal hemorrhoids were found during                            retroflexion. The hemorrhoids were small and Grade                            II (internal hemorrhoids that prolapse but reduce                            spontaneously). Complications:            No immediate complications. Estimated Blood Loss:     Estimated blood loss was minimal. Impression:               - Hemorrhoids found on perianal exam.                           - Four 3 to 6 mm polyps in the sigmoid colon, in                            the descending colon and at the hepatic flexure,                            removed with a cold snare. Resected and retrieved.                           - Diverticulosis in the sigmoid colon.                           - Non-bleeding internal hemorrhoids. Recommendation:            - Patient has a contact number available for                            emergencies. The signs and symptoms of potential                            delayed complications were discussed with the                            patient. Return to normal activities tomorrow.                            Written discharge instructions were provided to the                            patient.                           - Resume previous diet.                           - Continue present medications.                           -  Await pathology results.                           - Repeat colonoscopy for surveillance based on                            pathology results.                           - Use fiber, for example Citrucel, Fibercon, Konsyl                            or Metamucil.                           - Return to GI clinic PRN. Gerrit Heck, MD 11/18/2020 12:47:09 PM

## 2020-11-18 NOTE — Progress Notes (Signed)
Called to room to assist during endoscopic procedure.  Patient ID and intended procedure confirmed with present staff. Received instructions for my participation in the procedure from the performing physician.  

## 2020-11-18 NOTE — Progress Notes (Signed)
GASTROENTEROLOGY PROCEDURE H&P NOTE   Primary Care Physician: Rachel Noe, MD    Reason for Procedure:   FOBT+ stool, colon cancer screening  Plan:    Colonoscopy  Patient is appropriate for endoscopic procedure(s) in the ambulatory ([facility]) setting.  The nature of the procedure, as well as the risks, benefits, and alternatives were carefully and thoroughly reviewed with the patient. Ample time for discussion and questions allowed. The patient understood, was satisfied, and agreed to proceed.     HPI: Rachel Quinn is a 75 y.o. female who presents for colonoscopy for evalaution of FOBT+ stool. No overt bleeding. Last colonoscopy was approximately 20 years ago. Otherwise no active GI sxs.   Past Medical History:  Diagnosis Date   Cataract    Chronic kidney disease    Heart murmur    Hypertension    Kidney stone    Thyroid disease     Past Surgical History:  Procedure Laterality Date   CATARACT EXTRACTION Bilateral 01/2020   KIDNEY SURGERY  2000   NEPHRECTOMY Left 01/18/1999   2/2 nephrolithiasis   THYROIDECTOMY  01/17/2002   TUBAL LIGATION      Prior to Admission medications   Medication Sig Start Date End Date Taking? Authorizing Provider  atenolol-chlorthalidone (TENORETIC) 50-25 MG tablet Take 1 tablet by mouth daily. 03/31/20  Yes Rachel Noe, MD  atorvastatin (LIPITOR) 10 MG tablet Take 1 tablet (10 mg total) by mouth daily. 03/31/20  Yes Rachel Noe, MD  calcium carbonate (OS-CAL - DOSED IN MG OF ELEMENTAL CALCIUM) 1250 (500 Ca) MG tablet Take 1 tablet by mouth.   Yes [provider]  levothyroxine (SYNTHROID) 75 MCG tablet Take 1 tablet (75 mcg total) by mouth daily. 04/01/20  Yes Rachel Noe, MD  potassium citrate (UROCIT-K) 10 MEQ (1080 MG) SR tablet Take 1 tablet (10 mEq total) by mouth daily. 03/31/20  Yes Rachel Noe, MD    Current Outpatient Medications  Medication Sig Dispense Refill   atenolol-chlorthalidone  (TENORETIC) 50-25 MG tablet Take 1 tablet by mouth daily. 90 tablet 3   atorvastatin (LIPITOR) 10 MG tablet Take 1 tablet (10 mg total) by mouth daily. 90 tablet 3   calcium carbonate (OS-CAL - DOSED IN MG OF ELEMENTAL CALCIUM) 1250 (500 Ca) MG tablet Take 1 tablet by mouth.     levothyroxine (SYNTHROID) 75 MCG tablet Take 1 tablet (75 mcg total) by mouth daily. 90 tablet 3   potassium citrate (UROCIT-K) 10 MEQ (1080 MG) SR tablet Take 1 tablet (10 mEq total) by mouth daily. 90 tablet 3   Current Facility-Administered Medications  Medication Dose Route Frequency Provider Last Rate Last Admin   0.9 %  sodium chloride infusion  500 mL Intravenous Once Rachel Hylton V, DO        Allergies as of 11/18/2020   (No Known Allergies)    Family History  Problem Relation Age of Onset   Healthy Mother    Healthy Father    Cancer Neg Hx    Heart attack Neg Hx    Stroke Neg Hx    Colon cancer Neg Hx    Esophageal cancer Neg Hx    Rectal cancer Neg Hx    Stomach cancer Neg Hx     Social History   Socioeconomic History   Marital status: Married    Spouse name: Not on file   Number of children: 9   Years of education: college   Highest education  level: Not on file  Occupational History   Not on file  Tobacco Use   Smoking status: Never   Smokeless tobacco: Never  Vaping Use   Vaping Use: Never used  Substance and Sexual Activity   Alcohol use: No    Alcohol/week: 0.0 standard drinks   Drug use: No   Sexual activity: Not Currently  Other Topics Concern   Not on file  Social History Narrative   Lives with son Rachel Quinn) and his family   From the [place], moved to the [place] 20 years ago   Enjoys: spending time with family, church Sunday, 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   Exercise: taking care of her granddaughter (60 years old), walking daily   Diet: rice, fish, chicken, veggies, fruit   Social Determinants of Health   Financial Resource Strain: Not on file  Food Insecurity:  Not on file  Transportation Needs: Not on file  Physical Activity: Not on file  Stress: Not on file  Social Connections: Not on file  Intimate Partner Violence: Not on file    Physical Exam: Vital signs in last 24 hours: @BP  122/60   Pulse 71   Temp 97.7 F (36.5 C)   Resp 19   Ht 5' 0.5" (1.537 m)   Wt 163 lb (73.9 kg)   SpO2 100%   BMI 31.31 kg/m  GEN: NAD EYE: Sclerae anicteric ENT: MMM CV: Non-tachycardic Pulm: CTA b/l GI: Soft, NT/ND NEURO:  Alert & Oriented x [place], DO [facility]   11/18/2020 12:15 PM

## 2020-11-18 NOTE — Patient Instructions (Signed)
Impression/Recommendations:  Polyp, hemorrhoid, and diverticulosis handouts given to patient.  Resume previous diet. Continue present medications. Await pathology results.  Repeat colonoscopy for surveillance based on pathology results.  Use fiber (Citrucel, Fibercon, Konsyl, or Metamucil).  Return to GI clinic as needed.  YOU HAD AN ENDOSCOPIC PROCEDURE TODAY AT [facility]:   Refer to the procedure report that was given to you for any specific questions about what was found during the examination.  If the procedure report does not answer your questions, please call your gastroenterologist to clarify.  If you requested that your care partner not be given the details of your procedure findings, then the procedure report has been included in a sealed envelope for you to review at your convenience later.  YOU SHOULD EXPECT: Some feelings of bloating in the abdomen. Passage of more gas than usual.  Walking can help get rid of the air that was put into your GI tract during the procedure and reduce the bloating. If you had a lower endoscopy (such as a colonoscopy or flexible sigmoidoscopy) you may notice spotting of blood in your stool or on the toilet paper. If you underwent a bowel prep for your procedure, you may not have a normal bowel movement for a few days.  Please Note:  You might notice some irritation and congestion in your nose or some drainage.  This is from the oxygen used during your procedure.  There is no need for concern and it should clear up in a day or so.  SYMPTOMS TO REPORT IMMEDIATELY:  Following lower endoscopy (colonoscopy or flexible sigmoidoscopy):  Excessive amounts of blood in the stool  Significant tenderness or worsening of abdominal pains  Swelling of the abdomen that is new, acute  Fever of 100F or higher  For urgent or emergent issues, a gastroenterologist can be reached at any hour by calling [phone]. Do not use MyChart messaging for  urgent concerns.    DIET:  We do recommend a small meal at first, but then you may proceed to your regular diet.  Drink plenty of fluids but you should avoid alcoholic beverages for 24 hours.  ACTIVITY:  You should plan to take it easy for the rest of today and you should NOT DRIVE or use heavy machinery until tomorrow (because of the sedation medicines used during the test).    FOLLOW UP: Our staff will call the number listed on your records 48-72 hours following your procedure to check on you and address any questions or concerns that you may have regarding the information given to you following your procedure. If we do not reach you, we will leave a message.  We will attempt to reach you two times.  During this call, we will ask if you have developed any symptoms of COVID 19. If you develop any symptoms (ie: fever, flu-like symptoms, shortness of breath, cough etc.) before then, please call [phone].  If you test positive for Covid 19 in the 2 weeks post procedure, please call and report this information to [place].    If any biopsies were taken you will be contacted by phone or by letter within the next 1-3 weeks.  Please call us at [phone] if you have not heard about the biopsies in 3 weeks.    SIGNATURES/CONFIDENTIALITY: You and/or your care partner have signed paperwork which will be entered into your electronic medical record.  These signatures attest to the fact that that the information above on your  After Visit Summary has been reviewed and is understood.  Full responsibility of the confidentiality of this discharge information lies with you and/or your care-partner.

## 2020-11-18 NOTE — Progress Notes (Signed)
Report to PACU, RN, vss, BBS= Clear.  

## 2020-11-20 ENCOUNTER — Telehealth: Payer: Self-pay

## 2020-11-20 NOTE — Telephone Encounter (Signed)
Left message on follow up call. 

## 2020-11-20 NOTE — Telephone Encounter (Signed)
Pt niece states pt is c/o neck pain, not sure if that is from the colonoscopy.  Pt states feels like a "stiff neck" and took tylenol last night with some relief, states she is eating and drinking ok and back to her normal activities.  Let pt and niece know to contact PCP as neck pain may  be something they would want to see her and address as this is not r/t colonoscopy.

## 2020-11-20 NOTE — Telephone Encounter (Signed)
  Follow up Call-  Call back number 11/18/2020  Post procedure Call Back phone  # [phone]  Permission to leave phone message Yes  Some recent data might be hidden     Patient questions:  Do you have a fever, pain , or abdominal swelling? No. Pain Score  0 *  Have you tolerated food without any problems? Yes.    Have you been able to return to your normal activities? Yes.    Do you have any questions about your discharge instructions: Diet   No. Medications  No. Follow up visit  No.  Do you have questions or concerns about your Care? No.  Actions: * If pain score is 4 or above: No action needed, pain <4.  Have you developed a fever since your procedure? no  2.   Have you had an respiratory symptoms (SOB or cough) since your procedure? no  3.   Have you tested positive for COVID 19 since your procedure no  4.   Have you had any family members/close contacts diagnosed with the COVID 19 since your procedure?  no   If yes to any of these questions please route to Joylene John, RN and Joella Prince, RN

## 2020-11-21 ENCOUNTER — Ambulatory Visit
Admission: EM | Admit: 2020-11-21 | Discharge: 2020-11-21 | Disposition: A | Discharge status: Home / Self Care | Payer: Medicare HMO | Attending: Internal Medicine | Admitting: Internal Medicine

## 2020-11-21 ENCOUNTER — Other Ambulatory Visit: Payer: Self-pay

## 2020-11-21 DIAGNOSIS — M542 Cervicalgia: Secondary | ICD-10-CM

## 2020-11-21 DIAGNOSIS — S161XXA Strain of muscle, fascia and tendon at neck level, initial encounter: Secondary | ICD-10-CM | POA: Diagnosis not present

## 2020-11-21 MED ORDER — METHYLPREDNISOLONE ACETATE 80 MG/ML IJ SUSP
60.0000 mg | Freq: Once | INTRAMUSCULAR | Status: AC
  Administered 2020-11-21: 60 mg via INTRAMUSCULAR

## 2020-11-21 NOTE — Discharge Instructions (Signed)
You were given a steroid injection today in urgent care to help decrease inflammation associated with your neck strain.  Please follow-up with provided contact information for orthopedist if pain persists.

## 2020-11-21 NOTE — ED Triage Notes (Signed)
Pt c/o lower neck pain first noticed after their colonoscopy last week. Denies remembering any direct injury or trauma to the area, or a history of injury or trauma to the area. Onset about last Wednesday.

## 2020-11-21 NOTE — ED Provider Notes (Signed)
[facility]    CSN: [id number] Arrival date & time: 11/21/20  1054      History   Chief Complaint Chief Complaint  Patient presents with   Neck Pain    HPI Zaelynn [name] is a 75 y.o. female.   Patient presents with bilateral neck pain that extends into upper back that started approximately 3 days ago.  Patient reports that back pain started after colonoscopy was performed on 11/18/2020.  Patient denies any apparent injury.  Denies photophobia, blurred vision, nausea, vomiting, headache, dizziness.  Has taken Tylenol with some relief of pain.  Denies numbness or tingling.   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      Home Medications    Prior to Admission medications   Medication Sig Start Date End Date Taking? Authorizing Provider  atenolol-chlorthalidone (TENORETIC) 50-25 MG tablet Take 1 tablet by mouth daily. 03/31/20   Lesleigh Noe, MD  atorvastatin (LIPITOR) 10 MG tablet Take 1 tablet (10 mg total) by mouth daily. 03/31/20   Lesleigh Noe, MD  calcium carbonate (OS-CAL - DOSED IN MG OF ELEMENTAL CALCIUM) 1250 (500 Ca) MG tablet Take 1 tablet by mouth.    [provider]  levothyroxine (SYNTHROID) 75 MCG tablet Take 1 tablet (75 mcg total) by mouth daily. 04/01/20   Lesleigh Noe, MD  potassium citrate  (UROCIT-K) 10 MEQ (1080 MG) SR tablet Take 1 tablet (10 mEq total) by mouth daily. 03/31/20   Lesleigh Noe, MD    Family History Family History  Problem Relation Age of Onset   Healthy Mother    Healthy Father    Cancer Neg Hx    Heart attack Neg Hx    Stroke Neg Hx    Colon cancer Neg Hx    Esophageal cancer Neg Hx    Rectal cancer Neg Hx    Stomach cancer Neg Hx     Social History Social History   Tobacco Use   Smoking status: Never   Smokeless tobacco: Never  Vaping Use   Vaping Use: Never used  Substance Use Topics   Alcohol use: No    Alcohol/week: 0.0 standard drinks   Drug use: No     Allergies   Patient has no known allergies.   Review of Systems Review of Systems Per HPI  Physical Exam Triage Vital Signs ED Triage Vitals  Enc Vitals Group     BP 11/21/20 1110 (!) 153/75     Pulse Rate 11/21/20 1107 60     Resp 11/21/20 1107 18     Temp 11/21/20 1107 (!) 97.4 F (36.3 C)     Temp Source 11/21/20 1107 Oral     SpO2 11/21/20 1107 98 %  Weight --      Height --      Head Circumference --      Peak Flow --      Pain Score 11/21/20 1110 0     Pain Loc --      Pain Edu? --      Excl. in [place]? --    No data found.  Updated Vital Signs BP (!) 153/75 (BP Location: Left Arm)   Pulse 60   Temp (!) 97.4 F (36.3 C) (Oral)   Resp 18   SpO2 98%   Visual Acuity Right Eye Distance:   Left Eye Distance:   Bilateral Distance:    Right Eye Near:   Left Eye Near:    Bilateral Near:     Physical Exam Constitutional:      General: She is not in acute distress.    Appearance: Normal appearance. She is not toxic-appearing or diaphoretic.  HENT:     Head: Normocephalic and atraumatic.  Eyes:     Extraocular Movements: Extraocular movements intact.     Conjunctiva/sclera: Conjunctivae normal.  Pulmonary:     Effort: Pulmonary effort is normal.  Musculoskeletal:     Cervical back: Normal range of motion. No edema, erythema, signs of trauma or  crepitus. Pain with movement and muscular tenderness present. No spinous process tenderness.     Thoracic back: Normal.     Lumbar back: Normal.     Comments: Tenderness to palpation to bilateral neck.  No spinal tenderness.  Grip strength 5/5 bilaterally.  Neurovascular intact.  Lymphadenopathy:     Cervical: No cervical adenopathy.  Neurological:     General: No focal deficit present.     Mental Status: She is alert and oriented to person, place, and time. Mental status is at baseline.     Cranial Nerves: Cranial nerves 2-12 are intact.     Sensory: Sensation is intact.     Motor: Motor function is intact.     Coordination: Coordination is intact.     Gait: Gait is intact.  Psychiatric:        Mood and Affect: Mood normal.        Behavior: Behavior normal.        Thought Content: Thought content normal.        Judgment: Judgment normal.     UC Treatments / Results  Labs (all labs ordered are listed, but only abnormal results are displayed) Labs Reviewed - No data to display  EKG   Radiology No results found.  Procedures Procedures (including critical care time)  Medications Ordered in UC Medications  methylPREDNISolone acetate (DEPO-MEDROL) injection 60 mg (60 mg Intramuscular Given 11/21/20 1409)    Initial Impression / Assessment and Plan / UC Course  I have reviewed the triage vital signs and the nursing notes.  Pertinent labs & imaging results that were available during my care of the patient were reviewed by me and considered in my medical decision making (see chart for details).     It appears the patient has a neck muscular strain.  Do not think that neck pain is related to recent procedure.  Limited options on pain management given patient's history of chronic kidney disease and gastrointestinal symptoms.  Will treat with low-dose steroid injection to decrease inflammation as this will avoid oral medication that could upset stomach.  Patient should follow-up  with provided contact information for orthopedist if pain persists.  Patient to alternate ice and heat application as  well.  No red flags seen on exam. Discussed strict return precautions.  Patient verbalized understanding was agreeable with plan. Final Clinical Impressions(s) / UC Diagnoses   Final diagnoses:  Neck pain  Strain of neck muscle, initial encounter     Discharge Instructions      You were given a steroid injection today in urgent care to help decrease inflammation associated with your neck strain.  Please follow-up with provided contact information for orthopedist if pain persists.     ED Prescriptions   None    PDMP not reviewed this encounter.   Teodora Medici, [place] 11/21/20 1435

## 2020-11-28 ENCOUNTER — Encounter: Payer: Self-pay | Admitting: Gastroenterology

## 2020-12-16 ENCOUNTER — Other Ambulatory Visit: Payer: Self-pay | Admitting: Family Medicine

## 2020-12-16 DIAGNOSIS — E890 Postprocedural hypothyroidism: Secondary | ICD-10-CM

## 2021-02-11 ENCOUNTER — Other Ambulatory Visit: Payer: Self-pay

## 2021-02-11 ENCOUNTER — Ambulatory Visit
Admission: EM | Admit: 2021-02-11 | Discharge: 2021-02-11 | Disposition: A | Discharge status: Home / Self Care | Payer: Medicare HMO | Attending: Emergency Medicine | Admitting: Emergency Medicine

## 2021-02-11 ENCOUNTER — Encounter: Payer: Self-pay | Admitting: Emergency Medicine

## 2021-02-11 ENCOUNTER — Ambulatory Visit
Admission: RE | Admit: 2021-02-11 | Discharge: 2021-02-11 | Disposition: A | Discharge status: Home / Self Care | Payer: Medicare HMO | Source: Ambulatory Visit | Attending: Family Medicine | Admitting: Family Medicine

## 2021-02-11 DIAGNOSIS — M85852 Other specified disorders of bone density and structure, left thigh: Secondary | ICD-10-CM | POA: Diagnosis not present

## 2021-02-11 DIAGNOSIS — J45909 Unspecified asthma, uncomplicated: Secondary | ICD-10-CM | POA: Diagnosis not present

## 2021-02-11 DIAGNOSIS — R062 Wheezing: Secondary | ICD-10-CM

## 2021-02-11 DIAGNOSIS — Z77098 Contact with and (suspected) exposure to other hazardous, chiefly nonmedicinal, chemicals: Secondary | ICD-10-CM

## 2021-02-11 DIAGNOSIS — Z780 Asymptomatic menopausal state: Secondary | ICD-10-CM | POA: Diagnosis not present

## 2021-02-11 DIAGNOSIS — E2839 Other primary ovarian failure: Secondary | ICD-10-CM

## 2021-02-11 MED ORDER — AEROCHAMBER PLUS FLO-VU MEDIUM MISC
1.0000 | Freq: Once | Status: AC
  Administered 2021-02-11: 1

## 2021-02-11 MED ORDER — AZITHROMYCIN 250 MG PO TABS
ORAL_TABLET | ORAL | 0 refills | Status: AC
Start: 2021-02-11 — End: 2021-02-16

## 2021-02-11 MED ORDER — ALBUTEROL SULFATE HFA 108 (90 BASE) MCG/ACT IN AERS: 2.0000 | INHALATION_SPRAY | Freq: Four times a day (QID) | RESPIRATORY_TRACT | 0 refills | Status: DC | PRN

## 2021-02-11 MED ORDER — GUAIFENESIN 400 MG PO TABS
ORAL_TABLET | ORAL | 0 refills | Status: DC
Start: 2021-02-11 — End: 2021-05-28

## 2021-02-11 MED ORDER — ALBUTEROL SULFATE HFA 108 (90 BASE) MCG/ACT IN AERS
2.0000 | INHALATION_SPRAY | Freq: Once | RESPIRATORY_TRACT | Status: AC
  Administered 2021-02-11: 2 via RESPIRATORY_TRACT

## 2021-02-11 MED ORDER — METHYLPREDNISOLONE 4 MG PO TBPK: ORAL_TABLET | ORAL | 0 refills | Status: DC

## 2021-02-11 MED ORDER — METHYLPREDNISOLONE SODIUM SUCC 125 MG IJ SOLR
125.0000 mg | Freq: Once | INTRAMUSCULAR | Status: AC
  Administered 2021-02-11: 125 mg via INTRAMUSCULAR

## 2021-02-11 MED ORDER — AEROCHAMBER PLUS FLO-VU LARGE MISC
1.0000 | Freq: Once | 0 refills | Status: AC
Start: 2021-02-11 — End: 2021-02-11

## 2021-02-11 NOTE — ED Provider Notes (Signed)
[facility]    CSN: [id number] Arrival date & time: 02/11/21  1638    HISTORY   Chief Complaint  Patient presents with   Cough   HPI Rachel L Earlie Quinn is a 76 y.o. female. Patient c/o non-productive cough x 5 days, sore throat, states cough is much worse at night.  Patient states she has been taking Mucinex 1200 mg twice daily at home, states her daughter performed home COVID test which was negative yesterday.  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  Patient states she went back into the bathroom, which was not ventilated, to clean.  Patient states she reads and a lot of bleach odor.  Patient denies history of allergies, asthma, COPD.  Patient states she has never smoked cigarettes.  The history is provided by the patient.  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    Home Medications    Prior to Admission medications   Medication Sig Start Date End Date Taking? Authorizing Provider  atenolol-chlorthalidone (TENORETIC) 50-25 MG tablet Take 1 tablet by mouth daily. 03/31/20  Yes Lesleigh Noe, MD  atorvastatin (LIPITOR) 10 MG tablet Take 1 tablet (10 mg  total) by mouth daily. 03/31/20  Yes Lesleigh Noe, MD  calcium carbonate (OS-CAL - DOSED IN MG OF ELEMENTAL CALCIUM) 1250 (500 Ca) MG tablet Take 1 tablet by mouth.   Yes [provider]  levothyroxine (SYNTHROID) 75 MCG tablet Take 1 tablet (75 mcg total) by mouth daily. 04/01/20  Yes Lesleigh Noe, MD  potassium citrate (UROCIT-K) 10 MEQ (1080 MG) SR tablet Take 1 tablet (10 mEq total) by mouth daily. 03/31/20  Yes Lesleigh Noe, MD   Family History Family History  Problem Relation Age of Onset   Healthy Mother    Healthy Father    Cancer Neg Hx    Heart attack Neg Hx    Stroke Neg Hx    Colon cancer Neg Hx    Esophageal cancer Neg Hx    Rectal cancer Neg Hx    Stomach cancer Neg Hx    Social History Social History   Tobacco Use   Smoking status: Never   Smokeless tobacco: Never  Vaping Use   Vaping Use: Never used  Substance Use Topics   Alcohol use: No    Alcohol/week: 0.0 standard drinks   Drug use: No   Allergies   Patient has no known allergies.  Review of Systems Review of Systems Pertinent findings noted in history of present illness.   Physical Exam Triage Vital Signs  ED Triage Vitals  Enc Vitals Group     BP 11/13/20 0827 (!) 147/82     Pulse Rate 11/13/20 0827 72     Resp 11/13/20 0827 18     Temp 11/13/20 0827 98.3 F (36.8 C)     Temp Source 11/13/20 0827 Oral     SpO2 11/13/20 0827 98 %     Weight --      Height --      Head Circumference --      Peak Flow --      Pain Score 11/13/20 0826 5     Pain Loc --      Pain Edu? --      Excl. in [place]? --   No data found.  Updated Vital Signs BP (!) 162/85 (BP Location: Left Arm)    Pulse (!) 52    Temp (!) 97.3 F (36.3 C) (Oral)    Resp 18    SpO2 97%   Physical Exam Vitals and nursing note reviewed.  Constitutional:      General: She is not in acute distress.    Appearance: Normal appearance. She is not ill-appearing.  HENT:     Head: Normocephalic and atraumatic.     Salivary  Glands: Right salivary gland is not diffusely enlarged or tender. Left salivary gland is not diffusely enlarged or tender.     Right Ear: Tympanic membrane, ear canal and external ear normal. No drainage. No middle ear effusion. There is no impacted cerumen. Tympanic membrane is not erythematous or bulging.     Left Ear: Tympanic membrane, ear canal and external ear normal. No drainage.  No middle ear effusion. There is no impacted cerumen. Tympanic membrane is not erythematous or bulging.     Nose: Nose normal. No nasal deformity, septal deviation, mucosal edema, congestion or rhinorrhea.     Right Turbinates: Not enlarged, swollen or pale.     Left Turbinates: Not enlarged, swollen or pale.     Right Sinus: No maxillary sinus tenderness or frontal sinus tenderness.     Left Sinus: No maxillary sinus tenderness or frontal sinus tenderness.     Mouth/Throat:     Lips: Pink. No lesions.     Mouth: Mucous membranes are moist. No oral lesions.     Pharynx: Oropharynx is clear. Uvula midline. No posterior oropharyngeal erythema or uvula swelling.     Tonsils: No tonsillar exudate. 0 on the right. 0 on the left.  Eyes:     General: Lids are normal.        Right eye: No discharge.        Left eye: No discharge.     Extraocular Movements: Extraocular movements intact.     Conjunctiva/sclera: Conjunctivae normal.     Right eye: Right conjunctiva is not injected.     Left eye: Left conjunctiva is not injected.  Neck:     Trachea: Trachea and phonation normal.  Cardiovascular:     Rate and Rhythm: Normal rate and regular rhythm.     Pulses: Normal pulses.     Heart sounds: Normal heart sounds. No murmur heard.   No friction rub. No gallop.  Pulmonary:     Effort: Pulmonary effort is normal. No accessory muscle usage, prolonged expiration or respiratory distress.     Breath sounds: No stridor, decreased air movement or transmitted upper airway sounds. Examination of the right-upper field reveals  wheezing. Examination of the left-upper field reveals wheezing. Examination of the  right-middle field reveals wheezing and rhonchi. Examination of the left-middle field reveals wheezing and rhonchi. Examination of the right-lower field reveals wheezing. Examination of the left-lower field reveals wheezing. Wheezing and rhonchi present. No decreased breath sounds or rales.  Chest:     Chest wall: No tenderness.  Musculoskeletal:        General: Normal range of motion.     Cervical back: Normal range of motion and neck supple. Normal range of motion.  Lymphadenopathy:     Cervical: No cervical adenopathy.  Skin:    General: Skin is warm and dry.     Findings: No erythema or rash.  Neurological:     General: No focal deficit present.     Mental Status: She is alert and oriented to person, place, and time.  Psychiatric:        Mood and Affect: Mood normal.        Behavior: Behavior normal.    Visual Acuity Right Eye Distance:   Left Eye Distance:   Bilateral Distance:    Right Eye Near:   Left Eye Near:    Bilateral Near:     UC Couse / Diagnostics / Procedures:    EKG  Radiology No results found.  Procedures Procedures (including critical care time)  UC Diagnoses / Final Clinical Impressions(s)   I have reviewed the triage vital signs and the nursing notes.  Pertinent labs & imaging results that were available during my care of the patient were reviewed by me and considered in my medical decision making (see chart for details).   Final diagnoses:  Wheezing  Reactive airway disease without complication, unspecified asthma severity, unspecified whether persistent  Accidental exposure to bleach   Patient was provided with an injection of Solu-Medrol 125 mg in the office during her visit today.  Mild improvement of work of breathing and decrease of wheezing.  Patient provided with an oral dose of Medrol, albuterol.  Patient also provided patient with azithromycin for possible  bacterial superinfection.  Believe that bleach has caused a lot of trauma to her lungs and believe that this combination of treatment should help by her time until inflammation can calm down and lung tissue can heal.  Patient also advised to continue Mucinex.  Patient advised to avoid being around noxious fumes in the future.  Patient advised to follow-up with primary care in 3 to 5 days for repeat evaluation to ensure progress.  Patient advised to the emergency room should her symptoms worsen.  ED Prescriptions     Medication Sig Dispense Auth. Provider   albuterol (VENTOLIN HFA) 108 (90 Base) MCG/ACT inhaler Inhale 2 puffs into the lungs every 6 (six) hours as needed for wheezing or shortness of breath (Cough). 18 g Lynden Oxford Scales, PA-C   azithromycin (ZITHROMAX) 250 MG tablet Take 2 tablets (500 mg total) by mouth daily for 1 day, THEN 1 tablet (250 mg total) daily for 4 days. 6 tablet Lynden Oxford Scales, PA-C   methylPREDNISolone (MEDROL DOSEPAK) 4 MG TBPK tablet Take 24 mg on day 1, 20 mg on day 2, 16 mg on day 3, 12 mg on day 4, 8 mg on day 5, 4 mg on day 6. 21 tablet Lynden Oxford Scales, PA-C   Spacer/Aero-Holding Chambers (AEROCHAMBER PLUS FLO-VU LARGE) MISC 1 each by Other route once for 1 dose. 1 each Lynden Oxford Scales, PA-C   guaifenesin (HUMIBID E) 400 MG TABS tablet Take 1 tablet 3 times daily as needed for chest  congestion and cough 21 tablet Lynden Oxford Scales, PA-C      PDMP not reviewed this encounter.  Pending results:  Labs Reviewed - No data to display  Medications Ordered in UC: Medications  methylPREDNISolone sodium succinate (SOLU-MEDROL) 125 mg/2 mL injection 125 mg (125 mg Intramuscular Given 02/11/21 1828)    Disposition Upon Discharge:  Condition: stable for discharge home Home: take medications as prescribed; routine discharge instructions as discussed; follow up as advised.  Patient presented with an acute illness with associated systemic  symptoms and significant discomfort requiring urgent management. In my opinion, this is a condition that a prudent lay person (someone who possesses an average knowledge of health and medicine) may potentially expect to result in complications if not addressed urgently such as respiratory distress, impairment of bodily function or dysfunction of bodily organs.   Routine symptom specific, illness specific and/or disease specific instructions were discussed with the patient and/or caregiver at length.   As such, the patient has been evaluated and assessed, work-up was performed and treatment was provided in alignment with urgent care protocols and evidence based medicine.  Patient/parent/caregiver has been advised that the patient may require follow up for further testing and treatment if the symptoms continue in spite of treatment, as clinically indicated and appropriate.  If the patient was tested for COVID-19, Influenza and/or RSV, then the patient/parent/guardian was advised to isolate at home pending the results of his/her diagnostic coronavirus test and potentially longer if theyre positive. I have also advised pt that if his/her COVID-19 test returns positive, it's recommended to self-isolate for at least 10 days after symptoms first appeared AND until fever-free for 24 hours without fever reducer AND other symptoms have improved or resolved. Discussed self-isolation recommendations as well as instructions for household member/close contacts as per the [facility] and [place] [facility], and also gave patient the [place] packet with this information.  Patient/parent/caregiver has been advised to return to the [facility] or PCP in 3-5 days if no better; to PCP or the Emergency Department if new signs and symptoms develop, or if the current signs or symptoms continue to change or worsen for further workup, evaluation and treatment as clinically indicated and appropriate  The patient will follow up with their current PCP if and as  advised. If the patient does not currently have a PCP we will assist them in obtaining one.   The patient may need specialty follow up if the symptoms continue, in spite of conservative treatment and management, for further workup, evaluation, consultation and treatment as clinically indicated and appropriate.  Patient/parent/caregiver verbalized understanding and agreement of plan as discussed.  All questions were addressed during visit.  Please see discharge instructions below for further details of plan.  Discharge Instructions:   Discharge Instructions      I believe that your exposure to bleach while you are cleaning last week has caused severe irritation in your lungs and you are experiencing symptoms very similar to someone who is suffering from an asthma exacerbation.  You have wheezing in your lungs along with other abnormal lung sounds which are concerning for infection.  For this reason, you were provided with a steroid injection today to help calm some of the inflammation in your lungs.  I also recommend that you continue taking an oral dose of the same steroid over the next 6 days as well.  I also recommend that you use an inhaler called albuterol.  Please use this twice daily, 2 puffs in the morning  and 2 puffs at night.  This should relax the smooth muscle that constricts the airways in the lungs and should allow you to breathe more freely.  This will also help decrease your urge to cough.  Because bleach can be very traumatizing to the lungs, I am concerned that you may have developed a bacterial infection in your lungs as well.  I provided you with a prescription for an antibiotic called azithromycin which treats lung infections very well.  Please see the list below for recommended medications, dosages and frequencies that I have prescribed for you in an effort to provide relief of your current symptoms:     Azithromycin (Z-Pak):  take 2 tablets twice daily for 3 days.    Methylprednisolone (Medrol Dosepak): This is a steroid that will significantly calm your upper and lower airways, please take one row of tablets daily with your breakfast meal starting tomorrow morning until the prescription is complete.      Albuterol HFA: This is a bronchodilator, it relaxes the smooth muscles that constrict your airway in your lungs when you are feeling sick or having inflammation secondary to allergies or upper respiratory infection.  Please inhale 2 puffs twice daily every day using the spacer provided.  You can also inhale 2 more puffs as often as needed throughout the day for aggravating cough, chest tightness, feeling short of breath, wheezing.      Guaifenesin (Robitussin, Mucinex): This is an expectorant.  This helps break up chest congestion and loosen up thick nasal drainage making phlegm and drainage more liquid and therefore easier to remove.  I recommend being 400 mg three times daily as needed.     Please stay home from work and avoid being around noxious games such as bleach until you are feeling completely better and breathing freely without discomfort or cough.  Please follow-up with your primary care provider in the next 3 to 5 days for evaluation of your progress.  Thank you for visiting urgent care today.  I appreciate the opportunity to participate in your care.       This office note has been dictated using Museum/gallery curator.  Unfortunately, and despite my best efforts, this method of dictation can sometimes lead to occasional typographical or grammatical errors.  I apologize in advance if this occurs.     Lynden Oxford Scales, PA-C 02/11/21 1857

## 2021-02-11 NOTE — ED Triage Notes (Signed)
Patient c/o non-productive cough x 5 days, sore throat worse at night.  Patient has taken Mucinex and home COVID test was negative.

## 2021-02-11 NOTE — Discharge Instructions (Addendum)
I believe that your exposure to bleach while you are cleaning last week has caused severe irritation in your lungs and you are experiencing symptoms very similar to someone who is suffering from an asthma exacerbation.  You have wheezing in your lungs along with other abnormal lung sounds which are concerning for infection.  For this reason, you were provided with a steroid injection today to help calm some of the inflammation in your lungs.  I also recommend that you continue taking an oral dose of the same steroid over the next 6 days as well.  I also recommend that you use an inhaler called albuterol.  Please use this twice daily, 2 puffs in the morning and 2 puffs at night.  This should relax the smooth muscle that constricts the airways in the lungs and should allow you to breathe more freely.  This will also help decrease your urge to cough.  Because bleach can be very traumatizing to the lungs, I am concerned that you may have developed a bacterial infection in your lungs as well.  I provided you with a prescription for an antibiotic called azithromycin which treats lung infections very well.  Please see the list below for recommended medications, dosages and frequencies that I have prescribed for you in an effort to provide relief of your current symptoms:     Azithromycin (Z-Pak):  take 2 tablets twice daily for 3 days.   Methylprednisolone (Medrol Dosepak): This is a steroid that will significantly calm your upper and lower airways, please take one row of tablets daily with your breakfast meal starting tomorrow morning until the prescription is complete.      Albuterol HFA: This is a bronchodilator, it relaxes the smooth muscles that constrict your airway in your lungs when you are feeling sick or having inflammation secondary to allergies or upper respiratory infection.  Please inhale 2 puffs twice daily every day using the spacer provided.  You can also inhale 2 more puffs as often as needed  throughout the day for aggravating cough, chest tightness, feeling short of breath, wheezing.      Guaifenesin (Robitussin, Mucinex): This is an expectorant.  This helps break up chest congestion and loosen up thick nasal drainage making phlegm and drainage more liquid and therefore easier to remove.  I recommend being 400 mg three times daily as needed.     Please stay home from work and avoid being around noxious games such as bleach until you are feeling completely better and breathing freely without discomfort or cough.  Please follow-up with your primary care provider in the next 3 to 5 days for evaluation of your progress.  Thank you for visiting urgent care today.  I appreciate the opportunity to participate in your care.

## 2021-03-03 ENCOUNTER — Ambulatory Visit (INDEPENDENT_AMBULATORY_CARE_PROVIDER_SITE_OTHER): Payer: Medicare HMO

## 2021-03-03 ENCOUNTER — Ambulatory Visit
Admission: EM | Admit: 2021-03-03 | Discharge: 2021-03-03 | Disposition: A | Discharge status: Home / Self Care | Payer: Medicare HMO | Attending: Internal Medicine | Admitting: Internal Medicine

## 2021-03-03 DIAGNOSIS — R053 Chronic cough: Secondary | ICD-10-CM | POA: Diagnosis not present

## 2021-03-03 DIAGNOSIS — R059 Cough, unspecified: Secondary | ICD-10-CM | POA: Diagnosis not present

## 2021-03-03 MED ORDER — BENZONATATE 100 MG PO CAPS: 100.0000 mg | ORAL_CAPSULE | Freq: Three times a day (TID) | ORAL | 0 refills | Status: DC | PRN

## 2021-03-03 MED ORDER — PREDNISONE 20 MG PO TABS: 40.0000 mg | ORAL_TABLET | Freq: Every day | ORAL | 0 refills | Status: AC

## 2021-03-03 NOTE — Discharge Instructions (Signed)
Your chest x-ray was normal.  You have been prescribed another course of prednisone steroid as well as a cough medication.  Please follow-up with primary care doctor for further evaluation and management.

## 2021-03-03 NOTE — ED Provider Notes (Signed)
[facility]    CSN: [id number] Arrival date & time: 03/03/21  1331      History   Chief Complaint Chief Complaint  Patient presents with   Cough    HPI Stefanee [name] is a 76 y.o. female.   Patient presents with a multiweek history of nonproductive, dry cough.  Patient was seen on 02/11/2021 when symptoms first started.  She had symptoms of possible inhalation of bleach.  She was prescribed several medications including antibiotics and steroids with improvement of symptoms.  She reports that once medications were completed, symptoms returned.  Denies any known fevers or sick contacts.  She reports that she continues to work with [employer] bleach and thinks she may be inhaling too much.  Denies any shortness of breath. Denies any upper respiratory symptoms.    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      Home Medications    Prior to Admission medications   Medication Sig Start Date End Date Taking? Authorizing Provider  benzonatate (TESSALON) 100 MG capsule Take 1 capsule (100 mg total) by mouth every 8 (eight) hours as needed for cough. 03/03/21  Yes Azizah Lisle, Hildred Alamin E, FNP  predniSONE (DELTASONE) 20 MG tablet Take 2 tablets (40 mg total) by mouth daily for 5 days. 03/03/21 03/08/21 Yes Dallyn Bergland, Michele Rockers, FNP  albuterol (VENTOLIN HFA) 108 (90  Base) MCG/ACT inhaler Inhale 2 puffs into the lungs every 6 (six) hours as needed for wheezing or shortness of breath (Cough). 02/11/21   Lynden Oxford Scales, PA-C  atenolol-chlorthalidone (TENORETIC) 50-25 MG tablet Take 1 tablet by mouth daily. 03/31/20   Lesleigh Noe, MD  atorvastatin (LIPITOR) 10 MG tablet Take 1 tablet (10 mg total) by mouth daily. 03/31/20   Lesleigh Noe, MD  calcium carbonate (OS-CAL - DOSED IN MG OF ELEMENTAL CALCIUM) 1250 (500 Ca) MG tablet Take 1 tablet by mouth.    [provider]  guaifenesin (HUMIBID E) 400 MG TABS tablet Take 1 tablet 3 times daily as needed for chest congestion and cough 02/11/21   Lynden Oxford Scales, PA-C  levothyroxine (SYNTHROID) 75 MCG tablet Take 1 tablet (75 mcg total) by mouth daily. 04/01/20   Lesleigh Noe, MD  potassium citrate (UROCIT-K) 10 MEQ (1080 MG) SR tablet Take 1 tablet (10 mEq total) by mouth daily. 03/31/20   Lesleigh Noe, MD    Family History Family History  Problem Relation Age of Onset   Healthy Mother    Healthy Father    Cancer Neg Hx    Heart attack Neg Hx    Stroke Neg Hx    Colon cancer Neg Hx    Esophageal cancer Neg Hx    Rectal cancer Neg Hx    Stomach  cancer Neg Hx     Social History Social History   Tobacco Use   Smoking status: Never   Smokeless tobacco: Never  Vaping Use   Vaping Use: Never used  Substance Use Topics   Alcohol use: No    Alcohol/week: 0.0 standard drinks   Drug use: No     Allergies   Patient has no known allergies.   Review of Systems Review of Systems Per HPI  Physical Exam Triage Vital Signs ED Triage Vitals  Enc Vitals Group     BP 03/03/21 1356 (!) 155/92     Pulse Rate 03/03/21 1356 67     Resp 03/03/21 1356 18     Temp 03/03/21 1356 98.2 F (36.8 C)     Temp Source 03/03/21 1356 Oral     SpO2 03/03/21 1356 97 %     Weight --      Height --      Head Circumference --      Peak Flow --      Pain Score 03/03/21 1357 0     Pain Loc  --      Pain Edu? --      Excl. in [place]? --    No data found.  Updated Vital Signs BP (!) 155/92 (BP Location: Right Arm)    Pulse 67    Temp 98.2 F (36.8 C) (Oral)    Resp 18    SpO2 97%   Visual Acuity Right Eye Distance:   Left Eye Distance:   Bilateral Distance:    Right Eye Near:   Left Eye Near:    Bilateral Near:     Physical Exam Constitutional:      General: She is not in acute distress.    Appearance: Normal appearance. She is not toxic-appearing or diaphoretic.  HENT:     Head: Normocephalic and atraumatic.     Right Ear: Tympanic membrane and ear canal normal.     Left Ear: Tympanic membrane and ear canal normal.     Nose: Nose normal.     Mouth/Throat:     Mouth: Mucous membranes are moist.     Pharynx: No posterior oropharyngeal erythema.  Eyes:     Extraocular Movements: Extraocular movements intact.     Conjunctiva/sclera: Conjunctivae normal.     Pupils: Pupils are equal, round, and reactive to light.  Cardiovascular:     Rate and Rhythm: Normal rate and regular rhythm.     Pulses: Normal pulses.     Heart sounds: Normal heart sounds.  Pulmonary:     Effort: Pulmonary effort is normal. No respiratory distress.     Breath sounds: Normal breath sounds. No stridor. No wheezing, rhonchi or rales.  Abdominal:     General: Abdomen is flat. Bowel sounds are normal.     Palpations: Abdomen is soft.  Musculoskeletal:        General: Normal range of motion.     Cervical back: Normal range of motion.  Skin:    General: Skin is warm and dry.  Neurological:     General: No focal deficit present.     Mental Status: She is alert and oriented to person, place, and time. Mental status is at baseline.  Psychiatric:        Mood and Affect: Mood normal.        Behavior: Behavior normal.     UC Treatments / Results  Labs (all labs ordered are listed, but only abnormal results  are displayed) Labs Reviewed - No data to display  EKG   Radiology DG Chest 2  View  Result Date: 03/03/2021 CLINICAL DATA:  76 year old female presents with cough for few weeks. EXAM: CHEST - 2 VIEW COMPARISON:  January 05, 2017. FINDINGS: The heart size and mediastinal contours are within normal limits. Both lungs are clear. The visualized skeletal structures are unremarkable aside from an unchanged appearing lower thoracic compression fracture with mild kyphosis. IMPRESSION: No active cardiopulmonary disease. Electronically Signed   By: Zetta Bills M.D.   On: 03/03/2021 14:23    Procedures Procedures (including critical care time)  Medications Ordered in UC Medications - No data to display  Initial Impression / Assessment and Plan / UC Course  I have reviewed the triage vital signs and the nursing notes.  Pertinent labs & imaging results that were available during my care of the patient were reviewed by me and considered in my medical decision making (see chart for details).     Chest x-ray was negative for any acute cardiopulmonary process.  Poison control was called due to possibility of patient symptoms being attributed to inhalation of bleach.  They advised that the main thing that we should be concerned about is pneumonitis and to avoid bleach if possible.  Patient advised to avoid bleach.  Will prescribe another course of prednisone steroid and a cough medication.  Patient to follow-up with primary care doctor for further evaluation and management if symptoms persist or worsen as she may need pulmonology referral if symptoms are persistent.  Discussed return precautions.  Patient verbalized understanding and was agreeable with plan. Final Clinical Impressions(s) / UC Diagnoses   Final diagnoses:  Persistent cough for 3 weeks or longer     Discharge Instructions      Your chest x-ray was normal.  You have been prescribed another course of prednisone steroid as well as a cough medication.  Please follow-up with primary care doctor for further evaluation  and management.     ED Prescriptions     Medication Sig Dispense Auth. Provider   predniSONE (DELTASONE) 20 MG tablet Take 2 tablets (40 mg total) by mouth daily for 5 days. 10 tablet [place], [place] E, [place]   benzonatate (TESSALON) 100 MG capsule Take 1 capsule (100 mg total) by mouth every 8 (eight) hours as needed for cough. 21 capsule [place], Michele Rockers, [place]      PDMP not reviewed this encounter.   Teodora Medici, [place] 03/03/21 [phone]

## 2021-03-03 NOTE — ED Triage Notes (Signed)
Pt c/o cough, mild sore throat, occasional headache, mild sinus congestion, onset a few weeks ago. States she was seen here at some point and had taken medication with some relief but once medication stopped her symptoms became worse.

## 2021-03-10 ENCOUNTER — Other Ambulatory Visit: Payer: Medicare HMO

## 2021-04-22 ENCOUNTER — Other Ambulatory Visit: Payer: Self-pay | Admitting: Family Medicine

## 2021-04-22 DIAGNOSIS — E890 Postprocedural hypothyroidism: Secondary | ICD-10-CM

## 2021-04-23 NOTE — Telephone Encounter (Signed)
Refill request for (SYNTHROID) 75 MCG tablet ? ?LOV - 03/31/20 ?Next OV - not scheduled ?Last refill - 04/01/20 #90/3 ? ?

## 2021-05-25 ENCOUNTER — Other Ambulatory Visit: Payer: Self-pay | Admitting: Family Medicine

## 2021-05-25 DIAGNOSIS — I10 Essential (primary) hypertension: Secondary | ICD-10-CM

## 2021-05-28 ENCOUNTER — Ambulatory Visit (INDEPENDENT_AMBULATORY_CARE_PROVIDER_SITE_OTHER): Payer: Medicare HMO | Admitting: Family Medicine

## 2021-05-28 VITALS — BP 120/80 | HR 62 | Temp 97.3°F | Ht 60.5 in | Wt 168.0 lb

## 2021-05-28 DIAGNOSIS — E782 Mixed hyperlipidemia: Secondary | ICD-10-CM

## 2021-05-28 DIAGNOSIS — I10 Essential (primary) hypertension: Secondary | ICD-10-CM

## 2021-05-28 DIAGNOSIS — E876 Hypokalemia: Secondary | ICD-10-CM | POA: Diagnosis not present

## 2021-05-28 DIAGNOSIS — M7501 Adhesive capsulitis of right shoulder: Secondary | ICD-10-CM

## 2021-05-28 DIAGNOSIS — E890 Postprocedural hypothyroidism: Secondary | ICD-10-CM | POA: Diagnosis not present

## 2021-05-28 LAB — COMPREHENSIVE METABOLIC PANEL
ALT: 15 U/L (ref 0–35)
AST: 20 U/L (ref 0–37)
Albumin: 3.9 g/dL (ref 3.5–5.2)
Alkaline Phosphatase: 66 U/L (ref 39–117)
BUN: 18 mg/dL (ref 6–23)
CO2: 31 mEq/L (ref 19–32)
Calcium: 8.6 mg/dL (ref 8.4–10.5)
Chloride: 99 mEq/L (ref 96–112)
Creatinine, Ser: 1.04 mg/dL (ref 0.40–1.20)
GFR: 52.96 mL/min — ABNORMAL LOW (ref >60.00)
Glucose, Bld: 88 mg/dL (ref 70–99)
Potassium: 3.9 mEq/L (ref 3.5–5.1)
Sodium: 139 mEq/L (ref 135–145)
Total Bilirubin: 1.3 mg/dL — ABNORMAL HIGH (ref 0.2–1.2)
Total Protein: 7 g/dL (ref 6.0–8.3)

## 2021-05-28 LAB — LIPID PANEL
Cholesterol: 119 mg/dL (ref 0–200)
HDL: 39.6 mg/dL (ref >39.00)
LDL Cholesterol: 44 mg/dL (ref 0–99)
NonHDL: 78.94
Total CHOL/HDL Ratio: 3
Triglycerides: 176 mg/dL — ABNORMAL HIGH (ref 0.0–149.0)
VLDL: 35.2 mg/dL (ref 0.0–40.0)

## 2021-05-28 LAB — TSH: TSH: 13.46 u[IU]/mL — ABNORMAL HIGH (ref 0.35–5.50)

## 2021-05-28 MED ORDER — ATENOLOL-CHLORTHALIDONE 50-25 MG PO TABS: 1.0000 | ORAL_TABLET | Freq: Every day | ORAL | 3 refills | Status: DC

## 2021-05-28 MED ORDER — PREDNISONE 20 MG PO TABS: ORAL_TABLET | ORAL | 0 refills | Status: AC

## 2021-05-28 MED ORDER — ATORVASTATIN CALCIUM 10 MG PO TABS: 10.0000 mg | ORAL_TABLET | Freq: Every day | ORAL | 3 refills | Status: DC

## 2021-05-28 MED ORDER — POTASSIUM CITRATE ER 10 MEQ (1080 MG) PO TBCR: 10.0000 meq | EXTENDED_RELEASE_TABLET | Freq: Every day | ORAL | 3 refills | Status: DC

## 2021-05-28 NOTE — Assessment & Plan Note (Signed)
Stable. Levothyroxine 75 mcg. Recheck today ?

## 2021-05-28 NOTE — Assessment & Plan Note (Signed)
Taking atorvastatin 10 mg. Repeat today ?Lab Results  ?Component Value Date  ? LDLCALC 45 02/07/2019  ? ? ?

## 2021-05-28 NOTE — Assessment & Plan Note (Signed)
Controlled. Taking atenolol-chlorthialidone 50-25 mg. Also on potassium supplement ?

## 2021-05-28 NOTE — Patient Instructions (Addendum)
Frozen Shoulder ?- Steroids for 2 weeks ?- work on exercises with your daughter ?- if no improvement in 4 weeks - I would recommend do a physical therapy unless your daughter thinks otherwise ?- see Dr. Lorelei Pont (sports medicine) in 6 weeks if not improving ?

## 2021-05-28 NOTE — Assessment & Plan Note (Addendum)
System seems consistent with frozen shoulder.  Patient's daughter is a physical therapist, she declined referral today.  Discussed course of steroids to see if this helps with pain and range of motion, handout provided and advised her to help from her daughter.  If no improvement return and see Dr. Lorelei Pont ?

## 2021-05-28 NOTE — Progress Notes (Signed)
? ?Subjective:  ? ?  ?Rachel Quinn is a 76 y.o. female presenting for Medication Refill and Shoulder Pain (R with limited ROM) ?  ? ? ?HPI ? ?#HTN ?- taking medication ?- no cp, sob, ha, vision changes ? ?#Thyroid ?- taking medication ?- no issues ? ?#Shoulder pain ?- cannot lift the right shoulder ?- has been traveling a lot with lifting her suitcase ?- pain comes and goes ?- started with lifting too much ?- going on for 2 months ?- treatment: has not tried medication, rest/heat/ice ?- worse with lifting above the head ?- still mopping w/o issue ? ?Hx of left shoulder pain - XR with osteoarthritis 2019 ? ?Review of Systems ? ? ?Social History  ? ?Tobacco Use  ?Smoking Status Never  ?Smokeless Tobacco Never  ? ? ? ?   ?Objective:  ?  ?BP Readings from Last 3 Encounters:  ?05/28/21 120/80  ?03/03/21 (!) 155/92  ?02/11/21 (!) 162/85  ? ?Wt Readings from Last 3 Encounters:  ?05/28/21 168 lb (76.2 kg)  ?11/18/20 163 lb (73.9 kg)  ?09/18/20 163 lb 4 oz (74 kg)  ? ? ?BP 120/80   Pulse 62   Temp (!) 97.3 ?F (36.3 ?C) (Temporal)   Ht 5' 0.5" (1.537 m)   Wt 168 lb (76.2 kg)   SpO2 99%   BMI 32.27 kg/m?  ? ? ?Physical Exam ?Constitutional:   ?   General: She is not in acute distress. ?   Appearance: She is well-developed. She is not diaphoretic.  ?HENT:  ?   Right Ear: External ear normal.  ?   Left Ear: External ear normal.  ?   Nose: Nose normal.  ?Eyes:  ?   Conjunctiva/sclera: Conjunctivae normal.  ?Cardiovascular:  ?   Rate and Rhythm: Normal rate and regular rhythm.  ?   Heart sounds: No murmur heard. ?Pulmonary:  ?   Effort: Pulmonary effort is normal. No respiratory distress.  ?   Breath sounds: Normal breath sounds. No wheezing.  ?Musculoskeletal:  ?   Cervical back: Neck supple.  ?   Comments: Right Shoulder ?Inspection: no abnormalities ?ROM: Restricted ROM on rotation, flexion, extension ?Strength: normal  ?Skin: ?   General: Skin is warm and dry.  ?   Capillary Refill: Capillary refill takes less  than 2 seconds.  ?Neurological:  ?   Mental Status: She is alert. Mental status is at baseline.  ?Psychiatric:     ?   Mood and Affect: Mood normal.     ?   Behavior: Behavior normal.  ? ? ? ? ? ?   ?Assessment & Plan:  ? ?Problem List Items Addressed This Visit   ? ?  ? Cardiovascular and Mediastinum  ? Hypertension - Primary  ?  Controlled. Taking atenolol-chlorthialidone 50-25 mg. Also on potassium supplement ? ?  ?  ? Relevant Medications  ? atenolol-chlorthalidone (TENORETIC) 50-25 MG tablet  ? atorvastatin (LIPITOR) 10 MG tablet  ? potassium citrate (UROCIT-K) 10 MEQ (1080 MG) SR tablet  ?  ? Endocrine  ? Hypothyroidism, postsurgical  ?  Stable. Levothyroxine 75 mcg. Recheck today ? ?  ?  ? Relevant Orders  ? TSH  ?  ? Musculoskeletal and Integument  ? Adhesive capsulitis of right shoulder  ?  System seems consistent with frozen shoulder.  Patient's daughter is a physical therapist, she declined referral today.  Discussed course of steroids to see if this helps with pain and range of motion, handout provided and  advised her to help from her daughter.  If no improvement return and see Dr. Lorelei Pont ? ?  ?  ? Relevant Medications  ? predniSONE (DELTASONE) 20 MG tablet  ?  ? Other  ? Mixed hyperlipidemia  ?  Taking atorvastatin 10 mg. Repeat today ?Lab Results  ?Component Value Date  ? LDLCALC 45 02/07/2019  ? ?  ?  ? Relevant Medications  ? atenolol-chlorthalidone (TENORETIC) 50-25 MG tablet  ? atorvastatin (LIPITOR) 10 MG tablet  ? Other Relevant Orders  ? Lipid panel  ? ?Other Visit Diagnoses   ? ? Essential hypertension      ? Relevant Medications  ? atenolol-chlorthalidone (TENORETIC) 50-25 MG tablet  ? atorvastatin (LIPITOR) 10 MG tablet  ? potassium citrate (UROCIT-K) 10 MEQ (1080 MG) SR tablet  ? Other Relevant Orders  ? Comprehensive metabolic panel  ? Hypokalemia      ? Relevant Medications  ? potassium citrate (UROCIT-K) 10 MEQ (1080 MG) SR tablet  ? ?  ? ? ? ?Return in about 6 weeks (around 07/09/2021)  for frozen shoulder with Dr. Lorelei Pont. ? ?Lesleigh Noe, MD ? ? ? ?

## 2021-06-01 ENCOUNTER — Ambulatory Visit (INDEPENDENT_AMBULATORY_CARE_PROVIDER_SITE_OTHER): Payer: Medicare HMO | Admitting: *Deleted

## 2021-06-01 DIAGNOSIS — Z0000 Encounter for general adult medical examination without abnormal findings: Secondary | ICD-10-CM

## 2021-06-01 NOTE — Patient Instructions (Signed)
Ms. Earlie Raveling , ?Thank you for taking time to come for your Medicare Wellness Visit. I appreciate your ongoing commitment to your health goals. Please review the following plan we discussed and let me know if I can assist you in the future.  ? ?Screening recommendations/referrals: ?Colonoscopy: up to date ?Mammogram: declined ?Bone Density: up to date ?Recommended yearly ophthalmology/optometry visit for glaucoma screening and checkup ?Recommended yearly dental visit for hygiene and checkup ? ?Vaccinations: ?Influenza vaccine: Education provided ?Pneumococcal vaccine: Education provided ?Tdap vaccine: Education provided ?Shingles vaccine: Education provided   ? ?Advanced directives: Education provided ? ?Conditions/risks identified:  ? ? ? ? ?Preventive Care 46 Years and Older, Female ?Preventive care refers to lifestyle choices and visits with your health care provider that can promote health and wellness. ?What does preventive care include? ?A yearly physical exam. This is also called an annual well check. ?Dental exams once or twice a year. ?Routine eye exams. Ask your health care provider how often you should have your eyes checked. ?Personal lifestyle choices, including: ?Daily care of your teeth and gums. ?Regular physical activity. ?Eating a healthy diet. ?Avoiding tobacco and drug use. ?Limiting alcohol use. ?Practicing safe sex. ?Taking low-dose aspirin every day. ?Taking vitamin and mineral supplements as recommended by your health care provider. ?What happens during an annual well check? ?The services and screenings done by your health care provider during your annual well check will depend on your age, overall health, lifestyle risk factors, and family history of disease. ?Counseling  ?Your health care provider may ask you questions about your: ?Alcohol use. ?Tobacco use. ?Drug use. ?Emotional well-being. ?Home and relationship well-being. ?Sexual activity. ?Eating habits. ?History of falls. ?Memory and  ability to understand (cognition). ?Work and work Statistician. ?Reproductive health. ?Screening  ?You may have the following tests or measurements: ?Height, weight, and BMI. ?Blood pressure. ?Lipid and cholesterol levels. These may be checked every 5 years, or more frequently if you are over 43 years old. ?Skin check. ?Lung cancer screening. You may have this screening every year starting at age 76 if you have a 30-pack-year history of smoking and currently smoke or have quit within the past 15 years. ?Fecal occult blood test (FOBT) of the stool. You may have this test every year starting at age 49. ?Flexible sigmoidoscopy or colonoscopy. You may have a sigmoidoscopy every 5 years or a colonoscopy every 10 years starting at age 37. ?Hepatitis C blood test. ?Hepatitis B blood test. ?Sexually transmitted disease (STD) testing. ?Diabetes screening. This is done by checking your blood sugar (glucose) after you have not eaten for a while (fasting). You may have this done every 1-3 years. ?Bone density scan. This is done to screen for osteoporosis. You may have this done starting at age 14. ?Mammogram. This may be done every 1-2 years. Talk to your health care provider about how often you should have regular mammograms. ?Talk with your health care provider about your test results, treatment options, and if necessary, the need for more tests. ?Vaccines  ?Your health care provider may recommend certain vaccines, such as: ?Influenza vaccine. This is recommended every year. ?Tetanus, diphtheria, and acellular pertussis (Tdap, Td) vaccine. You may need a Td booster every 10 years. ?Zoster vaccine. You may need this after age 80. ?Pneumococcal 13-valent conjugate (PCV13) vaccine. One dose is recommended after age 39. ?Pneumococcal polysaccharide (PPSV23) vaccine. One dose is recommended after age 14. ?Talk to your health care provider about which screenings and vaccines you need and how often  you need them. ?This information is  not intended to replace advice given to you by your health care provider. Make sure you discuss any questions you have with your health care provider. ?Document Released: 01/30/2015 Document Revised: 09/23/2015 Document Reviewed: 11/04/2014 ?Elsevier Interactive Patient Education ? 2017 [employer]. ? ?Fall Prevention in the Home ?Falls can cause injuries. They can happen to people of all ages. There are many things you can do to make your home safe and to help prevent falls. ?What can I do on the outside of my home? ?Regularly fix the edges of walkways and driveways and fix any cracks. ?Remove anything that might make you trip as you walk through a door, such as a raised step or threshold. ?Trim any bushes or trees on the path to your home. ?Use bright outdoor lighting. ?Clear any walking paths of anything that might make someone trip, such as rocks or tools. ?Regularly check to see if handrails are loose or broken. Make sure that both sides of any steps have handrails. ?Any raised decks and porches should have guardrails on the edges. ?Have any leaves, snow, or ice cleared regularly. ?Use sand or salt on walking paths during winter. ?Clean up any spills in your garage right away. This includes oil or grease spills. ?What can I do in the bathroom? ?Use night lights. ?Install grab bars by the toilet and in the tub and shower. Do not use towel bars as grab bars. ?Use non-skid mats or decals in the tub or shower. ?If you need to sit down in the shower, use a plastic, non-slip stool. ?Keep the floor dry. Clean up any water that spills on the floor as soon as it happens. ?Remove soap buildup in the tub or shower regularly. ?Attach bath mats securely with double-sided non-slip rug tape. ?Do not have throw rugs and other things on the floor that can make you trip. ?What can I do in the bedroom? ?Use night lights. ?Make sure that you have a light by your bed that is easy to reach. ?Do not use any sheets or blankets that  are too big for your bed. They should not hang down onto the floor. ?Have a firm chair that has side arms. You can use this for support while you get dressed. ?Do not have throw rugs and other things on the floor that can make you trip. ?What can I do in the kitchen? ?Clean up any spills right away. ?Avoid walking on wet floors. ?Keep items that you use a lot in easy-to-reach places. ?If you need to reach something above you, use a strong step stool that has a grab bar. ?Keep electrical cords out of the way. ?Do not use floor polish or wax that makes floors slippery. If you must use wax, use non-skid floor wax. ?Do not have throw rugs and other things on the floor that can make you trip. ?What can I do with my stairs? ?Do not leave any items on the stairs. ?Make sure that there are handrails on both sides of the stairs and use them. Fix handrails that are broken or loose. Make sure that handrails are as long as the stairways. ?Check any carpeting to make sure that it is firmly attached to the stairs. Fix any carpet that is loose or worn. ?Avoid having throw rugs at the top or bottom of the stairs. If you do have throw rugs, attach them to the floor with carpet tape. ?Make sure that you have a light  switch at the top of the stairs and the bottom of the stairs. If you do not have them, ask someone to add them for you. ?What else can I do to help prevent falls? ?Wear shoes that: ?Do not have high heels. ?Have rubber bottoms. ?Are comfortable and fit you well. ?Are closed at the toe. Do not wear sandals. ?If you use a stepladder: ?Make sure that it is fully opened. Do not climb a closed stepladder. ?Make sure that both sides of the stepladder are locked into place. ?Ask someone to hold it for you, if possible. ?Clearly mark and make sure that you can see: ?Any grab bars or handrails. ?First and last steps. ?Where the edge of each step is. ?Use tools that help you move around (mobility aids) if they are needed. These  include: ?Canes. ?Walkers. ?Scooters. ?Crutches. ?Turn on the lights when you go into a dark area. Replace any light bulbs as soon as they burn out. ?Set up your furniture so you have a clear path. Avoid m

## 2021-06-01 NOTE — Progress Notes (Signed)
? ?Subjective:  ? Rachel Quinn is a 76 y.o. female who presents for Medicare Annual (Subsequent) preventive examination. ? ?I connected with  Wanza L Bynum on 06/01/21 by a telephone enabled telemedicine application and verified that I am speaking with the correct person using two identifiers. ?  ?I discussed the limitations of evaluation and management by telemedicine. The patient expressed understanding and agreed to proceed. ? ?Patient location: home ? ?Provider location: tele-health- home ? ? ? ?Review of Systems    ? ?Cardiac Risk Factors include: advanced age (>62mn, >>27women);hypertension ? ?   ?Objective:  ?  ?Today's Vitals  ? 06/01/21 1033  ?PainSc: 2   ? ?There is no height or weight on file to calculate BMI. ? ? ?  06/01/2021  ? 10:34 AM 03/31/2020  ?  3:37 PM  ?Advanced Directives  ?Does Patient Have a Medical Advance Directive? No No  ?Would patient like information on creating a medical advance directive? No - Patient declined Yes (MAU/Ambulatory/Procedural Areas - Information given)  ? ? ?Current Medications (verified) ?Outpatient Encounter Medications as of 06/01/2021  ?Medication Sig  ? albuterol (VENTOLIN HFA) 108 (90 Base) MCG/ACT inhaler Inhale 2 puffs into the lungs every 6 (six) hours as needed for wheezing or shortness of breath (Cough).  ? atenolol-chlorthalidone (TENORETIC) 50-25 MG tablet Take 1 tablet by mouth daily.  ? atorvastatin (LIPITOR) 10 MG tablet Take 1 tablet (10 mg total) by mouth daily.  ? calcium carbonate (OS-CAL - DOSED IN MG OF ELEMENTAL CALCIUM) 1250 (500 Ca) MG tablet Take 1 tablet by mouth.  ? levothyroxine (SYNTHROID) 75 MCG tablet Take 1 tablet by mouth once daily  ? potassium citrate (UROCIT-K) 10 MEQ (1080 MG) SR tablet Take 1 tablet (10 mEq total) by mouth daily.  ? predniSONE (DELTASONE) 20 MG tablet Take 2 tablets (40 mg total) by mouth daily with breakfast for 5 days, THEN 1 tablet (20 mg total) daily with breakfast for 5 days, THEN 0.5  tablets (10 mg total) daily with breakfast for 5 days.  ? ?No facility-administered encounter medications on file as of 06/01/2021.  ? ? ?Allergies (verified) ?Patient has no known allergies.  ? ?History: ?Past Medical History:  ?Diagnosis Date  ? Cataract   ? Chronic kidney disease   ? Heart murmur   ? Hypertension   ? Kidney stone   ? Thyroid disease   ? ?Past Surgical History:  ?Procedure Laterality Date  ? CATARACT EXTRACTION Bilateral 01/2020  ? KIDNEY SURGERY  2000  ? NEPHRECTOMY Left 01/18/1999  ? 2/2 nephrolithiasis  ? THYROIDECTOMY  01/17/2002  ? TUBAL LIGATION    ? ?Family History  ?Problem Relation Age of Onset  ? Healthy Mother   ? Healthy Father   ? Cancer Neg Hx   ? Heart attack Neg Hx   ? Stroke Neg Hx   ? Colon cancer Neg Hx   ? Esophageal cancer Neg Hx   ? Rectal cancer Neg Hx   ? Stomach cancer Neg Hx   ? ?Social History  ? ?Socioeconomic History  ? Marital status: Married  ?  Spouse name: Not on file  ? Number of children: 9  ? Years of education: college  ? Highest education level: Not on file  ?Occupational History  ? Not on file  ?Tobacco Use  ? Smoking status: Never  ? Smokeless tobacco: Never  ?Vaping Use  ? Vaping Use: Never used  ?Substance and Sexual Activity  ? Alcohol use: No  ?  Alcohol/week: 0.0 standard drinks  ? Drug use: No  ? Sexual activity: Not Currently  ?Other Topics Concern  ? Not on file  ?Social History Narrative  ? Lives with son [name]) and his family  ? From the [place], moved to the [place] 20 years ago  ? Enjoys: spending time with family, church Sunday, 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  ? Exercise: taking care of her granddaughter (38 years old), walking daily  ? Diet: rice, fish, chicken, veggies, fruit  ? ?Social Determinants of Health  ? ?Financial Resource Strain: Low Risk   ? Difficulty of Paying Living Expenses: Not hard at all  ?Food Insecurity: No Food Insecurity  ? Worried About Charity fundraiser in the Last Year: Never true  ? Ran Out of Food in the Last Year:  Never true  ?Transportation Needs: No Transportation Needs  ? Lack of Transportation (Medical): No  ? Lack of Transportation (Non-Medical): No  ?Physical Activity: Sufficiently Active  ? Days of Exercise per Week: 5 days  ? Minutes of Exercise per Session: 30 min  ?Stress: No Stress Concern Present  ? Feeling of Stress : Not at all  ?Social Connections: Moderately Integrated  ? Frequency of Communication with Friends and Family: More than three times a week  ? Frequency of Social Gatherings with Friends and Family: More than three times a week  ? Attends Religious Services: More than 4 times per year  ? Active Member of Clubs or Organizations: No  ? Attends Archivist Meetings: Never  ? Marital Status: Married  ? ? ?Tobacco Counseling ?Counseling given: Not Answered ? ? ?Clinical Intake: ? ?Pre-visit preparation completed: Yes ? ?Pain : 0-10 ?Pain Score: 2  ?Pain Type: Acute pain ?Pain Location: Arm ?Pain Orientation: Right ?Pain Descriptors / Indicators: Burning, Aching ?Pain Onset: In the past 7 days ?Pain Frequency: Intermittent ? ?  ? ?Nutritional Risks: None ?Diabetes: No ? ?How often do you need to have someone help you when you read instructions, pamphlets, or other written materials from your doctor or pharmacy?: 3 - Sometimes ? ?Diabetic?  no ? ?Interpreter Needed?: No ? ?Information entered by :: Leroy Kennedy LPN ? ? ?Activities of Daily Living ? ?  06/01/2021  ? 10:39 AM  ?In your present state of health, do you have any difficulty performing the following activities:  ?Hearing? 0  ?Vision? 0  ?Difficulty concentrating or making decisions? 0  ?Walking or climbing stairs? 0  ?Dressing or bathing? 0  ?Doing errands, shopping? 0  ?Preparing Food and eating ? N  ?Using the Toilet? N  ?In the past six months, have you accidently leaked urine? N  ?Do you have problems with loss of bowel control? N  ?Managing your Medications? N  ?Managing your Finances? N  ?Housekeeping or managing your Housekeeping? N   ? ? ?Patient Care Team: ?Lesleigh Noe, MD as PCP - General (Family Medicine) ?Darleen Crocker, MD as Consulting Physician (Ophthalmology) ? ?Indicate any recent Medical Services you may have received from other than [facility] providers in the past year (date may be approximate). ? ?   ?Assessment:  ? This is a routine wellness examination for Rachel Quinn. ? ?Hearing/Vision screen ?Hearing Screening - Comments:: No trouble hearing ?Vision Screening - Comments:: Up to date ?[facility] ?Had cataract surgery left and right ? ?Dietary issues and exercise activities discussed: ?Current Exercise Habits: Home exercise routine, Type of exercise: walking, Time (Minutes): 30, Frequency (Times/Week): 5, Weekly Exercise (Minutes/Week): 150, Intensity:  Mild, Exercise limited by: None identified ? ? Goals Addressed   ? ?  ?  ?  ?  ? This Visit's Progress  ?  Patient Stated     ?  Continue Current lifestyle ?  ? ?  ? ?Depression Screen ? ?  06/01/2021  ? 10:42 AM 03/31/2020  ?  3:39 PM 06/13/2017  ?  4:04 PM 04/10/2017  ? 11:02 AM 01/30/2017  ?  2:10 PM 01/05/2017  ? 10:00 AM 02/25/2016  ? 10:03 AM  ?PHQ 2/9 Scores  ?PHQ - 2 Score 0 0 0 0 0 0 0  ?  ?Fall Risk ? ?  06/01/2021  ? 10:36 AM 03/31/2020  ?  3:39 PM 03/31/2020  ?  3:20 PM 08/20/2018  ?  6:39 PM 06/13/2017  ?  4:04 PM  ?Fall Risk   ?Falls in the past year? 0 0 0 0 No  ?Comment    Emmi Telephone Survey: data to providers prior to load   ?Number falls in past yr: 0 0 0    ?Injury with Fall? 0 0     ?Risk for fall due to :  No Fall Risks     ?Follow up Falls evaluation completed;Education provided;Falls prevention discussed Falls evaluation completed     ? ? ?FALL RISK PREVENTION PERTAINING TO THE HOME: ? ?Any stairs in or around the home? Yes  ?If so, are there any without handrails? No  ?Home free of loose throw rugs in walkways, pet beds, electrical cords, etc? Yes  ?Adequate lighting in your home to reduce risk of falls? Yes  ? ?ASSISTIVE DEVICES UTILIZED TO PREVENT  FALLS: ? ?Life alert? No  ?Use of a cane, walker or w/c? No  ?Grab bars in the bathroom? Yes  ?Shower chair or bench in shower? Yes  ?Elevated toilet seat or a handicapped toilet? No  ? ?TIMED UP AND GO: ? ?Was the t

## 2021-06-09 ENCOUNTER — Other Ambulatory Visit: Payer: Self-pay | Admitting: Family Medicine

## 2021-06-09 DIAGNOSIS — E890 Postprocedural hypothyroidism: Secondary | ICD-10-CM

## 2021-06-09 MED ORDER — LEVOTHYROXINE SODIUM 88 MCG PO TABS: 88.0000 ug | ORAL_TABLET | Freq: Every day | ORAL | 1 refills | Status: DC

## 2021-06-09 NOTE — Progress Notes (Signed)
Cora (DPR signed) notified as instructed and voiced understanding.

## 2021-06-09 NOTE — Progress Notes (Signed)
Please notify patient, I've sent in a higher dose of levothyroxine to start taking

## 2021-07-09 ENCOUNTER — Encounter: Payer: Self-pay | Admitting: Family Medicine

## 2021-07-09 ENCOUNTER — Ambulatory Visit (INDEPENDENT_AMBULATORY_CARE_PROVIDER_SITE_OTHER): Payer: Medicare HMO | Admitting: Family Medicine

## 2021-07-09 VITALS — BP 118/70 | HR 58 | Temp 97.5°F | Ht 60.2 in | Wt 166.0 lb

## 2021-07-09 DIAGNOSIS — Z0000 Encounter for general adult medical examination without abnormal findings: Secondary | ICD-10-CM | POA: Diagnosis not present

## 2021-07-09 DIAGNOSIS — Z1231 Encounter for screening mammogram for malignant neoplasm of breast: Secondary | ICD-10-CM | POA: Diagnosis not present

## 2021-07-09 DIAGNOSIS — E890 Postprocedural hypothyroidism: Secondary | ICD-10-CM | POA: Diagnosis not present

## 2021-07-13 ENCOUNTER — Telehealth: Payer: Self-pay

## 2021-07-13 DIAGNOSIS — E890 Postprocedural hypothyroidism: Secondary | ICD-10-CM

## 2021-07-13 NOTE — Telephone Encounter (Signed)
Cora called in stating that Sarena wanted to let Dr.Cody know that she had an appointment last week and patient was asked if she was taking levothyroxine (SYNTHROID) 88 MCG tablet. Tameko told her that she was still taking 75 MG, but when she got home she realized that she had been taking the 88 MG dosage for about a month now.

## 2021-07-15 NOTE — Telephone Encounter (Signed)
Rachel Quinn notified as instructed by telephone.  Lab appointment scheduled for 08/05/21 at 12:30 pm to recheck TSH.

## 2021-08-05 ENCOUNTER — Other Ambulatory Visit (INDEPENDENT_AMBULATORY_CARE_PROVIDER_SITE_OTHER): Payer: Medicare HMO

## 2021-08-05 DIAGNOSIS — E890 Postprocedural hypothyroidism: Secondary | ICD-10-CM

## 2021-08-05 LAB — TSH: TSH: 0.58 u[IU]/mL (ref 0.35–5.50)

## 2021-09-10 ENCOUNTER — Ambulatory Visit
Admission: EM | Admit: 2021-09-10 | Discharge: 2021-09-10 | Disposition: A | Discharge status: Home / Self Care | Payer: Medicare HMO | Attending: Physician Assistant | Admitting: Physician Assistant

## 2021-09-10 ENCOUNTER — Encounter: Payer: Self-pay | Admitting: Emergency Medicine

## 2021-09-10 ENCOUNTER — Ambulatory Visit (INDEPENDENT_AMBULATORY_CARE_PROVIDER_SITE_OTHER): Payer: Medicare HMO

## 2021-09-10 ENCOUNTER — Ambulatory Visit: Payer: Medicare HMO | Admitting: Family Medicine

## 2021-09-10 DIAGNOSIS — M25561 Pain in right knee: Secondary | ICD-10-CM

## 2021-09-10 NOTE — ED Provider Notes (Signed)
[facility]    CSN: [id number] Arrival date & time: 09/10/21  0957      History   Chief Complaint Chief Complaint  Patient presents with   Knee Pain    HPI Rachel Quinn is a 76 y.o. female.   Patient presents today with a 1 day history of right knee pain.  She reports that symptoms began after she was working in the garden.  She denies any recent fall or trauma but does feel she must of injured herself while working in the garden.  Pain is rated 8 on a 0-10 pain scale at rest but increases to 10 with attempted ambulation, localized to inferior joint line, described as sharp, no aggravating relieving factors identified.  She has tried Tylenol with temporary improvement of symptoms.  She denies history of injury or surgery involving this knee.  She denies any swelling, numbness, paresthesias of the foot.  She denies any significant calf pain.  Denies any risk factors for DVT including exogenous hormone use, recent COVID-19 infection, immobilization, surgical procedure, travel, active malignancy.    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      Home Medications    Prior to Admission medications   Medication Sig Start Date End Date Taking? Authorizing Provider  albuterol (VENTOLIN HFA) 108  (90 Base) MCG/ACT inhaler Inhale 2 puffs into the lungs every 6 (six) hours as needed for wheezing or shortness of breath (Cough). 02/11/21   Lynden Oxford Scales, PA-C  atenolol-chlorthalidone (TENORETIC) 50-25 MG tablet Take 1 tablet by mouth daily. 05/28/21   Lesleigh Noe, MD  atorvastatin (LIPITOR) 10 MG tablet Take 1 tablet (10 mg total) by mouth daily. 05/28/21   Lesleigh Noe, MD  calcium carbonate (OS-CAL - DOSED IN MG OF ELEMENTAL CALCIUM) 1250 (500 Ca) MG tablet Take 1 tablet by mouth.    [provider]  levothyroxine (SYNTHROID) 88 MCG tablet Take 1 tablet (88 mcg total) by mouth daily. 06/09/21   Lesleigh Noe, MD  potassium citrate (UROCIT-K) 10 MEQ (1080 MG) SR tablet Take 1 tablet (10 mEq total) by mouth daily. 05/28/21   Lesleigh Noe, MD    Family History Family History  Problem Relation Age of Onset   Healthy Mother    Healthy Father    Cancer Neg Hx    Heart attack Neg Hx    Stroke Neg Hx    Colon cancer Neg Hx    Esophageal cancer Neg Hx    Rectal cancer Neg Hx    Stomach cancer Neg Hx     Social History Social History   Tobacco Use   Smoking status: Never   Smokeless tobacco: Never  Vaping Use  Vaping Use: Never used  Substance Use Topics   Alcohol use: No    Alcohol/week: 0.0 standard drinks of alcohol   Drug use: No     Allergies   Patient has no known allergies.   Review of Systems Review of Systems  Constitutional:  Positive for activity change. Negative for appetite change, fatigue and fever.  Musculoskeletal:  Positive for arthralgias and gait problem. Negative for joint swelling and myalgias.  Skin:  Negative for color change and wound.  Neurological:  Negative for dizziness, weakness, light-headedness, numbness and headaches.     Physical Exam Triage Vital Signs ED Triage Vitals  Enc Vitals Group     BP 09/10/21 1205 (!) 158/71     Pulse Rate 09/10/21 1203 62     Resp 09/10/21 1203 17     Temp 09/10/21 1203 97.6 F  (36.4 C)     Temp src --      SpO2 09/10/21 1203 97 %     Weight --      Height --      Head Circumference --      Peak Flow --      Pain Score 09/10/21 1203 10     Pain Loc --      Pain Edu? --      Excl. in [place]? --    No data found.  Updated Vital Signs BP (!) 158/71   Pulse 62   Temp 97.6 F (36.4 C)   Resp 17   SpO2 97%   Visual Acuity Right Eye Distance:   Left Eye Distance:   Bilateral Distance:    Right Eye Near:   Left Eye Near:    Bilateral Near:     Physical Exam Vitals reviewed.  Constitutional:      General: She is awake. She is not in acute distress.    Appearance: Normal appearance. She is well-developed. She is not ill-appearing.     Comments: Very pleasant female appears stated age in no acute distress sitting comfortably in exam room  HENT:     Head: Normocephalic and atraumatic.  Cardiovascular:     Rate and Rhythm: Normal rate and regular rhythm.     Pulses:          Posterior tibial pulses are 2+ on the right side and 2+ on the left side.     Heart sounds: Normal heart sounds, S1 normal and S2 normal. No murmur heard. Pulmonary:     Effort: Pulmonary effort is normal.     Breath sounds: Normal breath sounds. No wheezing, rhonchi or rales.     Comments: Clear to auscultation bilaterally Musculoskeletal:     Right knee: No swelling. Decreased range of motion. Tenderness present over the medial joint line and lateral joint line. No LCL laxity, MCL laxity, ACL laxity or PCL laxity.     Comments: Right knee: Decreased range of motion with flexion secondary to pain.  Tenderness palpation over inferior joint line without deformity.  No ligamentous laxity on exam.  Antalgic gait.  Negative Homans' sign.  No significant swelling of lower leg.  Psychiatric:        Behavior: Behavior is cooperative.      UC Treatments / Results  Labs (all labs ordered are listed, but only abnormal results are displayed) Labs Reviewed - No data to  display  EKG   Radiology DG Knee Complete 4 Views Right  Result Date: 09/10/2021 CLINICAL DATA:  Right knee pain EXAM: RIGHT  KNEE - COMPLETE 4+ VIEW COMPARISON:  None Available. FINDINGS: No evidence of fracture, dislocation, or joint effusion. No evidence of significant arthropathy or other focal bone abnormality. Atherosclerotic vascular calcifications. No soft tissue swelling. IMPRESSION: Negative. Electronically Signed   By: Davina Poke D.O.   On: 09/10/2021 12:43    Procedures Procedures (including critical care time)  Medications Ordered in UC Medications - No data to display  Initial Impression / Assessment and Plan / UC Course  I have reviewed the triage vital signs and the nursing notes.  Pertinent labs & imaging results that were available during my care of the patient were reviewed by me and considered in my medical decision making (see chart for details).     X-ray obtained showed no acute osseous abnormality.  Patient is unable to take NSAIDs due to history of nephrectomy and chronic kidney disease.  She was encouraged to continue Tylenol for pain relief.  Recommended conservative treatment measures including RICE protocol.  She was placed in a brace for comfort and support.  If her symptoms do not improving quickly she is to follow-up with orthopedics.  Recommended she call local provider if her symptoms do not improve to schedule an appointment.  She was provided their contact information during visit today.  If she has any worsening symptoms including increased pain, swelling, weakness, numbness, paresthesias she needs to be seen immediately to which she expressed understanding.  Final Clinical Impressions(s) / UC Diagnoses   Final diagnoses:  Acute pain of right knee     Discharge Instructions      Your x-ray was normal.  Continue Tylenol for pain.  We have given you a brace for comfort and support.  Keep your knee elevated and use ice for symptom relief.  If  your symptoms are not improving quickly please follow-up with orthopedics.  Call them to schedule an appointment.  If you have any worsening symptoms need to be seen immediately.     ED Prescriptions   None    PDMP not reviewed this encounter.   Terrilee Croak, PA-C 09/10/21 1318

## 2021-09-10 NOTE — ED Triage Notes (Signed)
Pt is present today with right knee pain. Pt states she noticed the pain after doing some yard work.Pt denies any injury

## 2021-09-10 NOTE — Discharge Instructions (Addendum)
Your x-ray was normal.  Continue Tylenol for pain.  We have given you a brace for comfort and support.  Keep your knee elevated and use ice for symptom relief.  If your symptoms are not improving quickly please follow-up with orthopedics.  Call them to schedule an appointment.  If you have any worsening symptoms need to be seen immediately.

## 2021-11-25 ENCOUNTER — Other Ambulatory Visit: Payer: Self-pay

## 2021-11-25 DIAGNOSIS — E890 Postprocedural hypothyroidism: Secondary | ICD-10-CM

## 2021-11-25 MED ORDER — LEVOTHYROXINE SODIUM 88 MCG PO TABS: 88.0000 ug | ORAL_TABLET | Freq: Every day | ORAL | 1 refills | Status: DC

## 2021-11-25 NOTE — Telephone Encounter (Signed)
Name of Medication: levothyroxine 88 mcg Name of Pharmacy: [facility] Last Fill or Written Date and Quantity:#90 x 1 on 06/09/21.   Last Office Visit and Type: medicare wellness 07/09/21 with Dr Einar Pheasant Next Office Visit and Type: no TOC scheduled  Sending request to Dutch Quint FNP.

## 2022-03-09 IMAGING — DX DG CHEST 2V
2 series · 2 of 2 positions shown · non-contrast
Comparison: January 05, 2017.

CLINICAL DATA: 75-year-old female presents with cough for few
weeks.

EXAM:
CHEST - 2 VIEW

[chest pa]
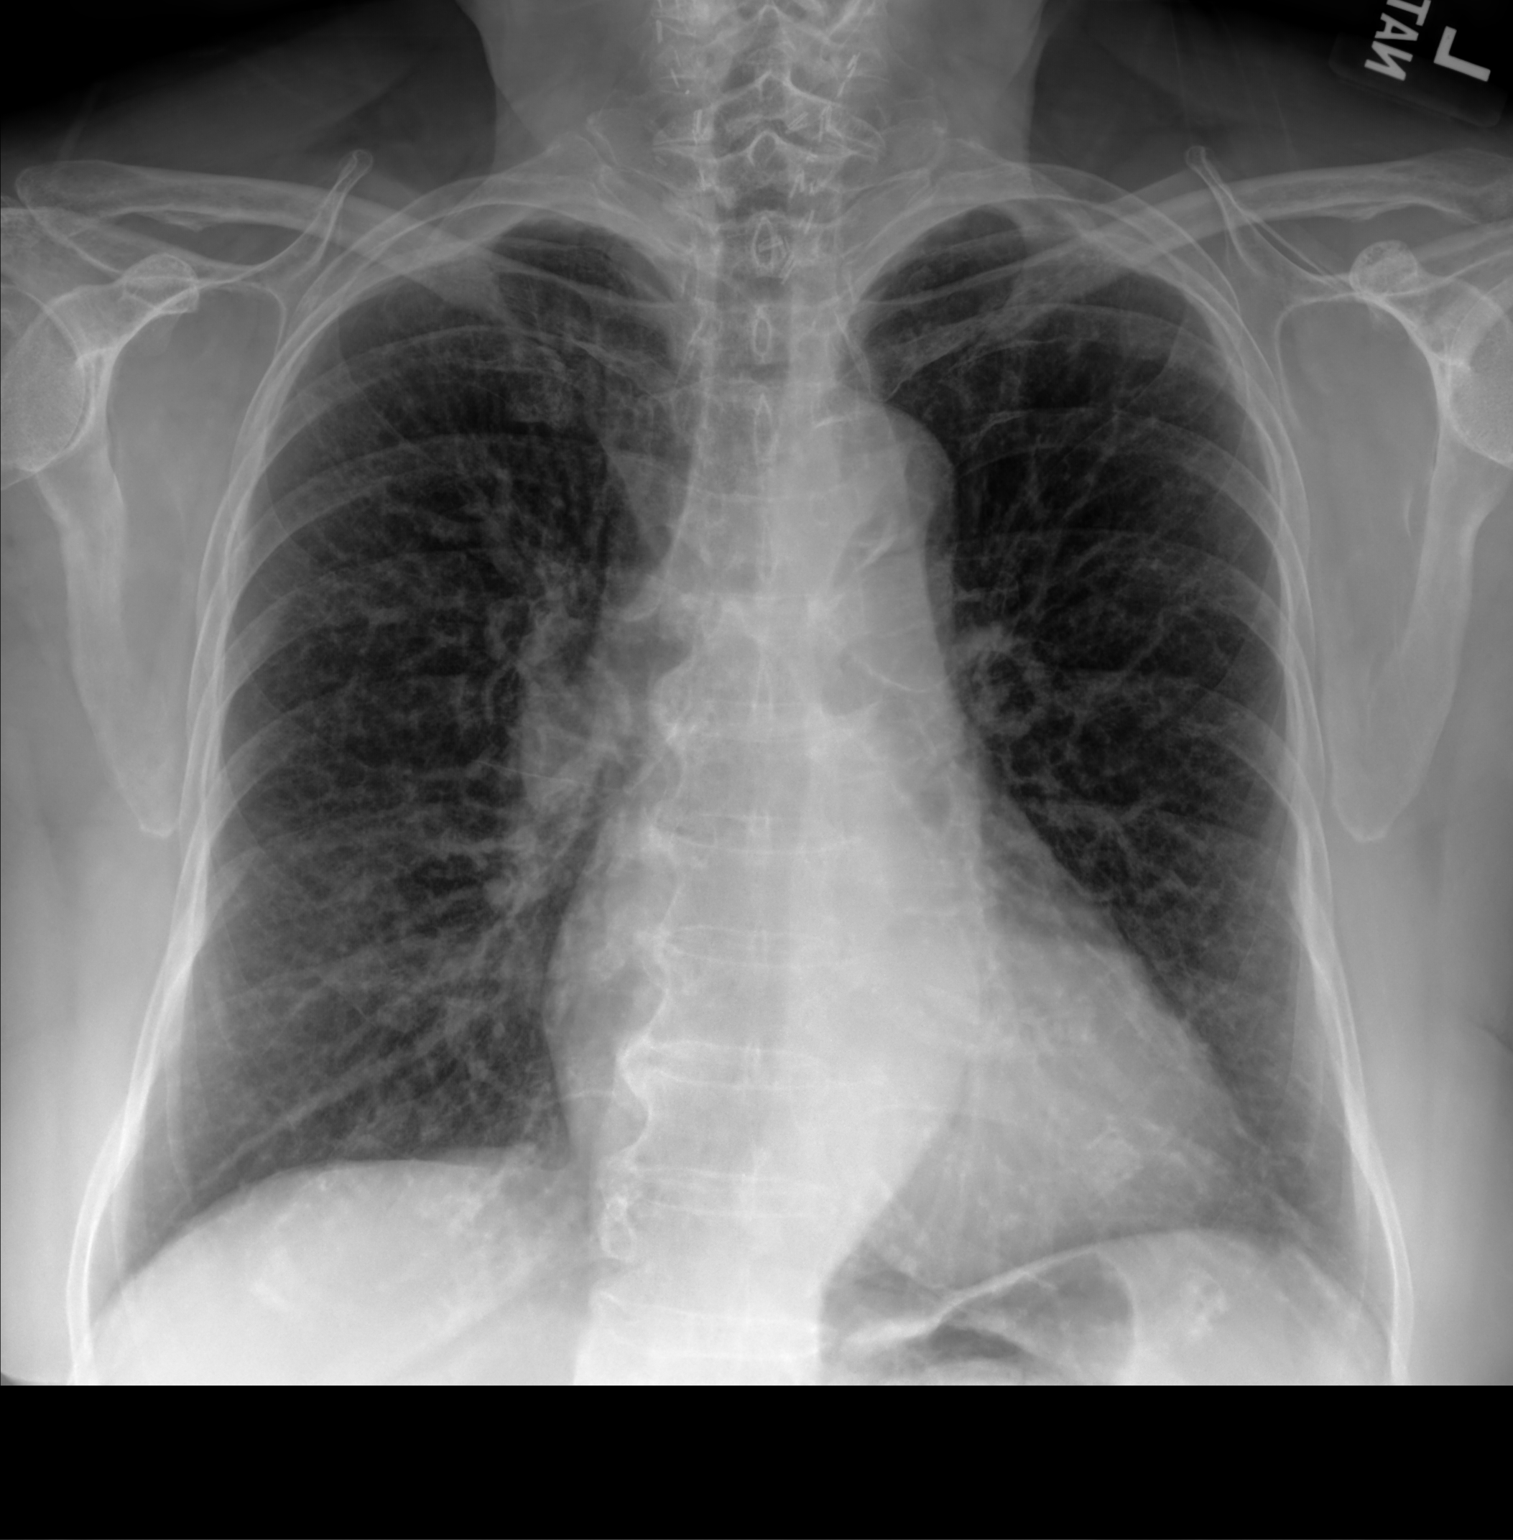

[chest lat]
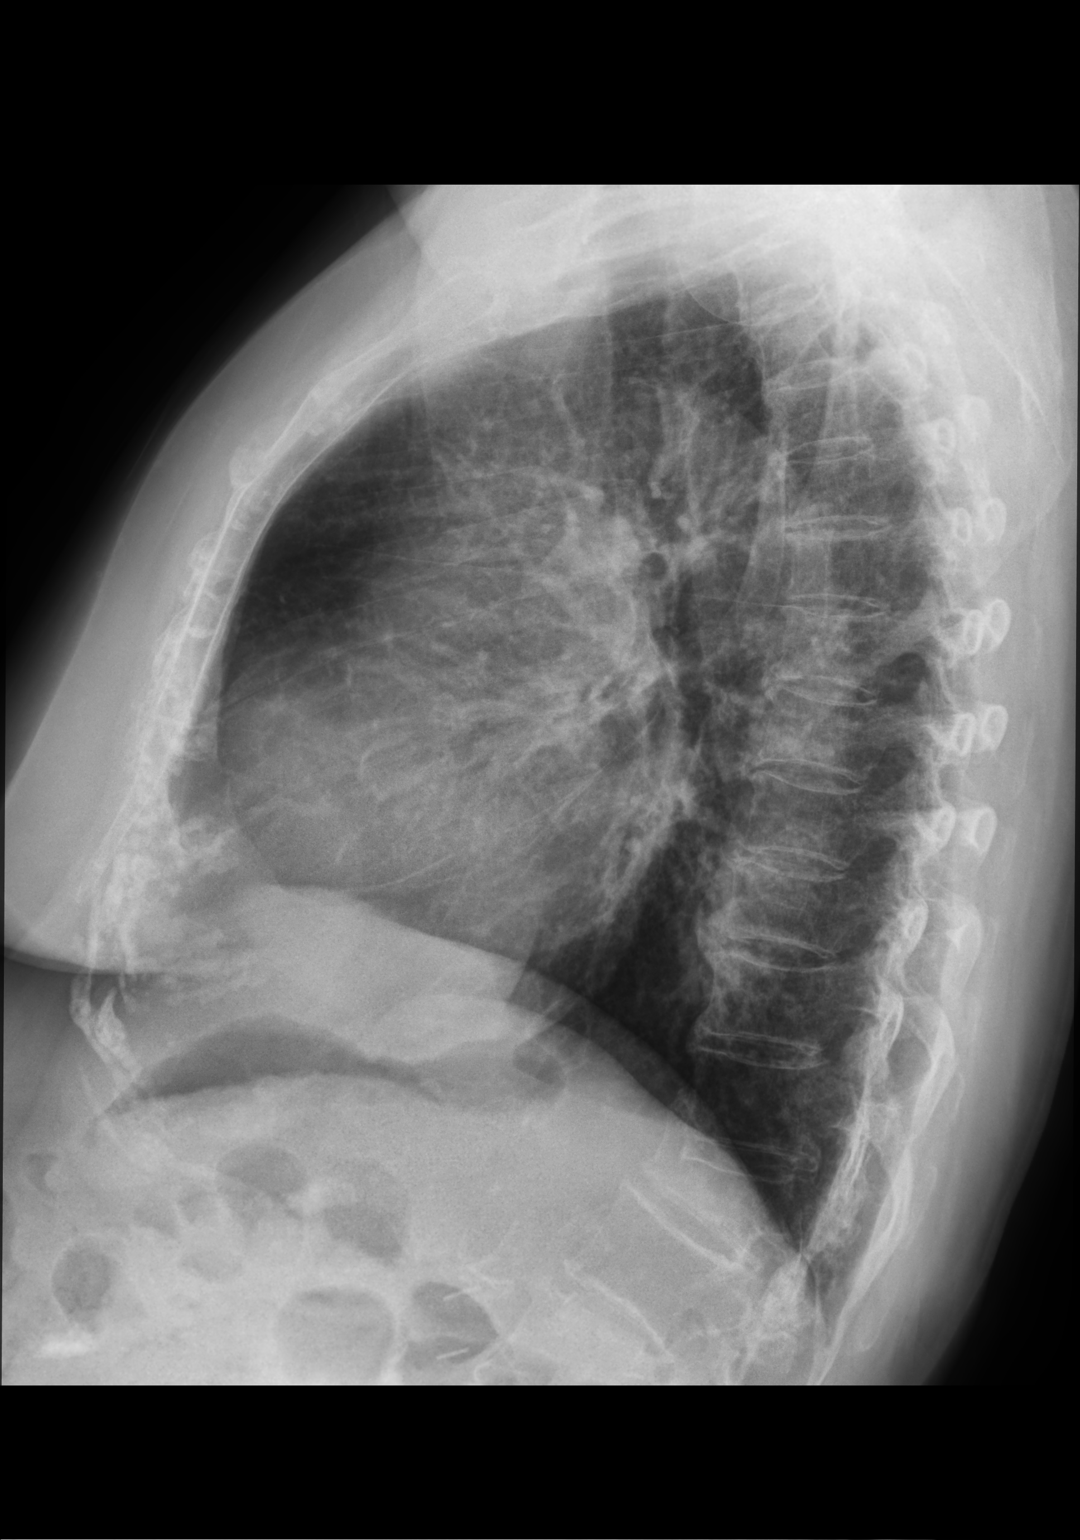

[2 of 2 positions shown; findings below may reference images not displayed]

FINDINGS: The heart size and mediastinal contours are within normal limits.
Both lungs are clear. The visualized skeletal structures are
unremarkable aside from an unchanged appearing lower thoracic
compression fracture with mild kyphosis.
IMPRESSION: No active cardiopulmonary disease.

## 2022-03-15 DIAGNOSIS — H524 Presbyopia: Secondary | ICD-10-CM | POA: Diagnosis not present

## 2022-03-15 DIAGNOSIS — H5203 Hypermetropia, bilateral: Secondary | ICD-10-CM | POA: Diagnosis not present

## 2022-06-09 ENCOUNTER — Other Ambulatory Visit: Payer: Self-pay | Admitting: Family

## 2022-06-09 DIAGNOSIS — E890 Postprocedural hypothyroidism: Secondary | ICD-10-CM

## 2022-06-14 ENCOUNTER — Telehealth: Payer: Self-pay | Admitting: Family Medicine

## 2022-06-14 ENCOUNTER — Other Ambulatory Visit: Payer: Self-pay | Admitting: Nurse Practitioner

## 2022-06-14 DIAGNOSIS — I10 Essential (primary) hypertension: Secondary | ICD-10-CM

## 2022-06-14 DIAGNOSIS — E876 Hypokalemia: Secondary | ICD-10-CM

## 2022-06-14 MED ORDER — POTASSIUM CITRATE ER 10 MEQ (1080 MG) PO TBCR: 10.0000 meq | EXTENDED_RELEASE_TABLET | Freq: Every day | ORAL | 0 refills | Status: DC

## 2022-06-14 NOTE — Telephone Encounter (Signed)
Prescription Request  06/14/2022  LOV: 07/09/2021  What is the name of the medication or equipment? potassium citrate (UROCIT-K) 10 MEQ (1080 MG) SR tablet   Have you contacted your pharmacy to request a refill? Yes   Which pharmacy would you like this sent to?  [facility] [address] ([address]),  - [address] [place]) [place] [address] Phone: [phone] Fax: [phone]    Patient notified that their request is being sent to the clinical staff for review and that they should receive a response within 2 business days.   Please advise at Mobile [phone] (daughter's mobile)  Patient has been out of this medication since last week, needs a refill whenever possible. I have set PT up for [facility] appointment

## 2022-06-16 ENCOUNTER — Telehealth: Payer: Self-pay | Admitting: Family Medicine

## 2022-06-16 DIAGNOSIS — E890 Postprocedural hypothyroidism: Secondary | ICD-10-CM

## 2022-06-16 DIAGNOSIS — E782 Mixed hyperlipidemia: Secondary | ICD-10-CM

## 2022-06-16 MED ORDER — LEVOTHYROXINE SODIUM 88 MCG PO TABS: 88.0000 ug | ORAL_TABLET | Freq: Every day | ORAL | 0 refills | Status: DC

## 2022-06-16 MED ORDER — ATORVASTATIN CALCIUM 10 MG PO TABS: 10.0000 mg | ORAL_TABLET | Freq: Every day | ORAL | 0 refills | Status: DC

## 2022-06-16 NOTE — Telephone Encounter (Signed)
30 supply of both medications sent in

## 2022-06-16 NOTE — Telephone Encounter (Signed)
Prescription Request  06/16/2022  LOV: 07/09/2021  What is the name of the medication or equipment? atorvastatin (LIPITOR) 10 MG tablet & levothyroxine (SYNTHROID) 88 MCG tablet    Have you contacted your pharmacy to request a refill? Yes  [name], Ivor Messier, states Jordan Hawks has sent multiple refill request to our office for meds. Pt is now out of meds.   Which pharmacy would you like this sent to?  [facility] [address] ([address]), [place] - [address] [place]) [place] [address] Phone: [phone] Fax: [phone]    Patient notified that their request is being sent to the clinical staff for review and that they should receive a response within 2 business days.   Please advise at Mobile [phone] (mobile)

## 2022-06-16 NOTE — Addendum Note (Signed)
Addended by: Eden Emms on: 06/16/2022 04:33 PM   Modules accepted: Orders

## 2022-06-16 NOTE — Telephone Encounter (Signed)
RX sent to provider

## 2022-06-20 ENCOUNTER — Ambulatory Visit (INDEPENDENT_AMBULATORY_CARE_PROVIDER_SITE_OTHER): Payer: Medicare HMO | Admitting: Nurse Practitioner

## 2022-06-20 ENCOUNTER — Encounter: Payer: Self-pay | Admitting: Nurse Practitioner

## 2022-06-20 VITALS — BP 130/60 | HR 50 | Temp 97.6°F | Resp 16 | Ht 60.2 in | Wt 160.5 lb

## 2022-06-20 DIAGNOSIS — I10 Essential (primary) hypertension: Secondary | ICD-10-CM | POA: Diagnosis not present

## 2022-06-20 DIAGNOSIS — E669 Obesity, unspecified: Secondary | ICD-10-CM

## 2022-06-20 DIAGNOSIS — Z905 Acquired absence of kidney: Secondary | ICD-10-CM

## 2022-06-20 DIAGNOSIS — E890 Postprocedural hypothyroidism: Secondary | ICD-10-CM

## 2022-06-20 LAB — CBC
HCT: 42.4 % (ref 36.0–46.0)
Hemoglobin: 14.1 g/dL (ref 12.0–15.0)
MCHC: 33.4 g/dL (ref 30.0–36.0)
MCV: 93.3 fl (ref 78.0–100.0)
Platelets: 179 10*3/uL (ref 150.0–400.0)
RBC: 4.54 Mil/uL (ref 3.87–5.11)
RDW: 14.7 % (ref 11.5–15.5)
WBC: 6.7 10*3/uL (ref 4.0–10.5)

## 2022-06-20 LAB — COMPREHENSIVE METABOLIC PANEL
ALT: 14 U/L (ref 0–35)
AST: 21 U/L (ref 0–37)
Albumin: 4 g/dL (ref 3.5–5.2)
Alkaline Phosphatase: 70 U/L (ref 39–117)
BUN: 22 mg/dL (ref 6–23)
CO2: 31 mEq/L (ref 19–32)
Calcium: 8.7 mg/dL (ref 8.4–10.5)
Chloride: 99 mEq/L (ref 96–112)
Creatinine, Ser: 1.06 mg/dL (ref 0.40–1.20)
GFR: 51.38 mL/min — ABNORMAL LOW (ref >60.00)
Glucose, Bld: 77 mg/dL (ref 70–99)
Potassium: 3.8 mEq/L (ref 3.5–5.1)
Sodium: 139 mEq/L (ref 135–145)
Total Bilirubin: 1.9 mg/dL — ABNORMAL HIGH (ref 0.2–1.2)
Total Protein: 7.2 g/dL (ref 6.0–8.3)

## 2022-06-20 LAB — LIPID PANEL
Cholesterol: 122 mg/dL (ref 0–200)
HDL: 42.5 mg/dL (ref >39.00)
LDL Cholesterol: 56 mg/dL (ref 0–99)
NonHDL: 79.24
Total CHOL/HDL Ratio: 3
Triglycerides: 114 mg/dL (ref 0.0–149.0)
VLDL: 22.8 mg/dL (ref 0.0–40.0)

## 2022-06-20 LAB — TSH: TSH: 2.59 u[IU]/mL (ref 0.35–5.50)

## 2022-06-20 MED ORDER — ATENOLOL-CHLORTHALIDONE 50-25 MG PO TABS: 1.0000 | ORAL_TABLET | Freq: Every day | ORAL | 0 refills | Status: DC

## 2022-06-20 NOTE — Patient Instructions (Signed)
Nice to see you today I will be in touch with the labs once I have reviewed them Follow up with me as scheduled in August

## 2022-06-20 NOTE — Assessment & Plan Note (Signed)
Patient currently maintained on atenolol-chlorthalidone 50-25 mg.  Patient is also on potassium replacement.  Pending labs continue medication as prescribed patient was noticed to be bradycardic but asymptomatic we will continue to monitor

## 2022-06-20 NOTE — Progress Notes (Signed)
Acute Office Visit  Subjective:     Patient ID: Rachel Quinn, female    DOB: 05/28/1945, 77 y.o.   MRN: [id number]  Chief Complaint  Patient presents with   Medication Refill     Patient is in today for medication refill  Patient was seen by previous provider and has not transitioned care over yet. She is in need of refills of her medications  HTN: does not check her blood pressure at home. States that before her granduagher lived with her and did. States that she does not have any lightheadedness or dizziness and tolerates the medication well   Thyroid: states that he has a hx of thyroidectomy and is on levothryoxine . Takes it by itself in the am and tolerates the medication well    Review of Systems  Constitutional:  Negative for chills and fever.  Respiratory:  Negative for shortness of breath.   Cardiovascular:  Negative for chest pain.  Gastrointestinal:  Negative for abdominal pain, constipation, diarrhea, nausea and vomiting.       BM dialy   Neurological:  Negative for headaches.  Psychiatric/Behavioral:  Negative for hallucinations and suicidal ideas.         Objective:    BP 130/60   Pulse (!) 50   Temp 97.6 F (36.4 C)   Resp 16   Ht 5' 0.2" (1.529 m)   Wt 160 lb 8 oz (72.8 kg)   SpO2 98%   BMI 31.14 kg/m    Physical Exam Vitals and nursing note reviewed.  Constitutional:      Appearance: Normal appearance.  Cardiovascular:     Rate and Rhythm: Normal rate and regular rhythm.     Pulses:          Dorsalis pedis pulses are 2+ on the right side and 2+ on the left side.     Heart sounds: Normal heart sounds.  Pulmonary:     Effort: Pulmonary effort is normal.     Breath sounds: Normal breath sounds.  Musculoskeletal:     Right lower leg: No edema.     Left lower leg: No edema.  Lymphadenopathy:     Cervical: No cervical adenopathy.  Neurological:     Mental Status: She is alert.     No results found for any visits on  06/20/22.      Assessment & Plan:   Problem List Items Addressed This Visit       Cardiovascular and Mediastinum   Hypertension    Patient currently maintained on atenolol-chlorthalidone 50-25 mg.  Patient is also on potassium replacement.  Pending labs continue medication as prescribed patient was noticed to be bradycardic but asymptomatic we will continue to monitor      Relevant Medications   atenolol-chlorthalidone (TENORETIC) 50-25 MG tablet   Other Relevant Orders   Lipid panel     Endocrine   Hypothyroidism, postsurgical    History of postsurgical hypothyroidism patient currently maintained on levothyroxine 88 mcg.  Refill medication pending TSH today      Relevant Orders   TSH     Other   S/p nephrectomy   Relevant Orders   Comprehensive metabolic panel   Other Visit Diagnoses     Essential hypertension    -  Primary   Relevant Medications   atenolol-chlorthalidone (TENORETIC) 50-25 MG tablet   Other Relevant Orders   CBC   Comprehensive metabolic panel   TSH   Obesity (BMI 30-39.9)  Relevant Orders   Lipid panel       Meds ordered this encounter  Medications   atenolol-chlorthalidone (TENORETIC) 50-25 MG tablet    Sig: Take 1 tablet by mouth daily.    Dispense:  90 tablet    Refill:  0    Add to refills    Return if symptoms worsen or fail to improve, for As scheduled .  [name], NP

## 2022-06-20 NOTE — Assessment & Plan Note (Signed)
History of postsurgical hypothyroidism patient currently maintained on levothyroxine 88 mcg.  Refill medication pending TSH today

## 2022-07-13 ENCOUNTER — Other Ambulatory Visit: Payer: Self-pay | Admitting: Family Medicine

## 2022-07-13 ENCOUNTER — Other Ambulatory Visit: Payer: Self-pay | Admitting: Nurse Practitioner

## 2022-07-13 DIAGNOSIS — E890 Postprocedural hypothyroidism: Secondary | ICD-10-CM

## 2022-07-13 DIAGNOSIS — E782 Mixed hyperlipidemia: Secondary | ICD-10-CM

## 2022-07-13 MED ORDER — ATORVASTATIN CALCIUM 10 MG PO TABS: 10.0000 mg | ORAL_TABLET | Freq: Every day | ORAL | 1 refills | Status: DC

## 2022-07-13 NOTE — Telephone Encounter (Signed)
Patient has TOC with you in August ok to send as pended.

## 2022-07-13 NOTE — Telephone Encounter (Signed)
Prescription Request  07/13/2022  LOV: Visit date not found  What is the name of the medication or equipment? atorvastatin (LIPITOR) 10 MG tablet levothyroxine (SYNTHROID) 88 MCG tablet   Have you contacted your pharmacy to request a refill? Yes   Which pharmacy would you like this sent to?  [facility] [address] ([address]), [place] - [address] [place]) [place] [address] Phone: [phone] Fax: [phone]    Patient notified that their request is being sent to the clinical staff for review and that they should receive a response within 2 business days.   Please advise at Mobile [phone] (mobile)

## 2022-08-17 ENCOUNTER — Encounter (INDEPENDENT_AMBULATORY_CARE_PROVIDER_SITE_OTHER): Payer: Self-pay

## 2022-08-18 ENCOUNTER — Encounter: Payer: Self-pay | Admitting: Emergency Medicine

## 2022-08-18 ENCOUNTER — Ambulatory Visit
Admission: EM | Admit: 2022-08-18 | Discharge: 2022-08-18 | Disposition: A | Discharge status: Home / Self Care | Payer: Medicare HMO | Attending: Family Medicine | Admitting: Family Medicine

## 2022-08-18 DIAGNOSIS — Z23 Encounter for immunization: Secondary | ICD-10-CM | POA: Diagnosis not present

## 2022-08-18 DIAGNOSIS — W5503XA Scratched by cat, initial encounter: Secondary | ICD-10-CM | POA: Diagnosis not present

## 2022-08-18 DIAGNOSIS — S91312A Laceration without foreign body, left foot, initial encounter: Secondary | ICD-10-CM

## 2022-08-18 DIAGNOSIS — S91011A Laceration without foreign body, right ankle, initial encounter: Secondary | ICD-10-CM

## 2022-08-18 MED ORDER — TETANUS-DIPHTH-ACELL PERTUSSIS 5-2.5-18.5 LF-MCG/0.5 IM SUSY
0.5000 mL | PREFILLED_SYRINGE | Freq: Once | INTRAMUSCULAR | Status: AC
  Administered 2022-08-18: 0.5 mL via INTRAMUSCULAR

## 2022-08-18 NOTE — Discharge Instructions (Signed)
You have been given a Tdap vaccination to boost your tetanus immunity  Our staff have filed an animal control report for you.  They should be contacting you about the cats that injured you.  Keep those cats sequestered and animal control can observe them for any signs of illness.  If those animals do not get sick in the next 10 days, you do not need any treatment or prevention for rabies.

## 2022-08-18 NOTE — ED Provider Notes (Addendum)
[facility]    CSN: [id number] Arrival date & time: 08/18/22  0941      History   Chief Complaint Chief Complaint  Patient presents with   Animal Bite    HPI Rachel Quinn is a 77 y.o. female.    Animal Bite Here for concern about exposure to rabies.  About 4 days ago when she was moving groceries in her house, she spooked some cats that have been living with her and being fed by her and they attacked her.  She is not sure herself if she was bitten or scratched.  On close questioning the patient states that all these cats are still confined in her house.  She has not taken these cats for vaccinations.    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      Home Medications    Prior to Admission medications   Medication Sig Start Date End Date Taking? Authorizing Provider  albuterol (VENTOLIN HFA) 108 (90 Base) MCG/ACT inhaler Inhale 2 puffs into the lungs every 6 (six) hours as needed for wheezing or shortness of breath (Cough). Patient not taking: Reported on 06/20/2022 02/11/21   Theadora Rama Scales, PA-C  atenolol-chlorthalidone (TENORETIC) 50-25 MG tablet Take 1 tablet by mouth daily. 06/20/22   Eden Emms, NP  atorvastatin (LIPITOR) 10 MG tablet Take 1 tablet (10 mg total) by  mouth daily. 07/13/22   Eden Emms, NP  calcium carbonate (OS-CAL - DOSED IN MG OF ELEMENTAL CALCIUM) 1250 (500 Ca) MG tablet Take 1 tablet by mouth.    [provider]  levothyroxine (SYNTHROID) 88 MCG tablet Take 1 tablet by mouth once daily 07/13/22   Eden Emms, NP  potassium citrate (UROCIT-K) 10 MEQ (1080 MG) SR tablet Take 1 tablet (10 mEq total) by mouth daily. 06/14/22   Eden Emms, NP    Family History Family History  Problem Relation Age of Onset   Healthy Mother    Healthy Father    Cancer Neg Hx    Heart attack Neg Hx    Stroke Neg Hx    Colon cancer Neg Hx    Esophageal cancer Neg Hx    Rectal cancer Neg Hx    Stomach cancer Neg Hx     Social History Social History   Tobacco Use   Smoking status: Never   Smokeless tobacco: Never  Vaping Use   Vaping status: Never Used  Substance Use Topics   Alcohol use: No    Alcohol/week: 0.0 standard drinks of alcohol   Drug use: No     Allergies   Patient has no known allergies.   Review of Systems Review of Systems   Physical Exam Triage  Vital Signs ED Triage Vitals  Encounter Vitals Group     BP 08/18/22 1128 124/80     Systolic BP Percentile --      Diastolic BP Percentile --      Pulse Rate 08/18/22 1128 62     Resp 08/18/22 1128 18     Temp 08/18/22 1128 98 F (36.7 C)     Temp Source 08/18/22 1128 Oral     SpO2 08/18/22 1128 96 %     Weight --      Height --      Head Circumference --      Peak Flow --      Pain Score 08/18/22 1130 0     Pain Loc --      Pain Education --      Exclude from Growth Chart --    No data found.  Updated Vital Signs BP 124/80 (BP Location: Right Arm)   Pulse 62   Temp 98 F (36.7 C) (Oral)   Resp 18   SpO2 96%   Visual Acuity Right Eye Distance:   Left Eye Distance:   Bilateral Distance:    Right Eye Near:   Left Eye Near:    Bilateral Near:     Physical Exam Vitals reviewed.  Constitutional:      General: She is not in acute  distress.    Appearance: She is not ill-appearing, toxic-appearing or diaphoretic.  Skin:    Coloration: Skin is not jaundiced or pale.     Comments: On her left foot on the dorsum there are 2 shallow linear abrasions about 1.5 cm in length.  On the posterior right ankle there is a linear abrasion that is about 8 cm in length.  I do not see any evidence of puncture wounds.  Neurological:     Mental Status: She is alert and oriented to person, place, and time.  Psychiatric:        Behavior: Behavior normal.      UC Treatments / Results  Labs (all labs ordered are listed, but only abnormal results are displayed) Labs Reviewed - No data to display  EKG   Radiology No results found.  Procedures Procedures (including critical care time)  Medications Ordered in UC Medications  Tdap (BOOSTRIX) injection 0.5 mL (0.5 mLs Intramuscular Given 08/18/22 1149)    Initial Impression / Assessment and Plan / UC Course  I have reviewed the triage vital signs and the nursing notes.  Pertinent labs & imaging results that were available during my care of the patient were reviewed by me and considered in my medical decision making (see chart for details).     Of note, on exam everything appears to be scratches/abrasion and not bite/puncture wounds. This did happen 3 days ago, though.   I have asked repeatedly, and the patient states that all the cats that might of attacked her are in her house.  I discussed with her that she can observe them for any signs of illness, and she should return here for further treatment if any of the animals gets sick.  I have discussed with her that animal control should contact her and it would probably be best for them to observe all the cats in their facilities.    The patient repeatedly states she just wants a rabies vaccine.  I have discussed with her that we only need to give rabies vaccines and Kedrab if she actually needs it.  I have offered getting  an  interpreter to make sure that all is being understood, but she emphatically declines.  It has been sometime since she had a tetanus vaccine, so that is updated today and she is agreeable to receiving that vaccine.   If the animals do show signs of illness/rabies, I do think it would be best to consider this a possible bite and treat accordingly. Final diagnoses:  Cat scratch     Discharge Instructions      You have been given a Tdap vaccination to boost your tetanus immunity  Our staff have filed an animal control report for you.  They should be contacting you about the cats that injured you.  Keep those cats sequestered and animal control can observe them for any signs of illness.  If those animals do not get sick in the next 10 days, you do not need any treatment or prevention for rabies.     ED Prescriptions   None    PDMP not reviewed this encounter.   Zenia Resides, MD 08/18/22 1145    Zenia Resides, MD 08/18/22 [phone]

## 2022-08-18 NOTE — ED Triage Notes (Signed)
Pt reports getting bit/scratched by a cat in the neighborhood on both feet x 4 days ago. Pt requesting rabies injection.

## 2022-08-31 ENCOUNTER — Encounter: Payer: Self-pay | Admitting: Nurse Practitioner

## 2022-08-31 ENCOUNTER — Ambulatory Visit (INDEPENDENT_AMBULATORY_CARE_PROVIDER_SITE_OTHER): Payer: Medicare HMO | Admitting: Nurse Practitioner

## 2022-08-31 VITALS — BP 138/80 | HR 55 | Temp 97.6°F | Ht 61.0 in | Wt 166.0 lb

## 2022-08-31 DIAGNOSIS — Z905 Acquired absence of kidney: Secondary | ICD-10-CM | POA: Diagnosis not present

## 2022-08-31 DIAGNOSIS — I10 Essential (primary) hypertension: Secondary | ICD-10-CM | POA: Diagnosis not present

## 2022-08-31 DIAGNOSIS — E782 Mixed hyperlipidemia: Secondary | ICD-10-CM | POA: Diagnosis not present

## 2022-08-31 DIAGNOSIS — I493 Ventricular premature depolarization: Secondary | ICD-10-CM

## 2022-08-31 DIAGNOSIS — Z0000 Encounter for general adult medical examination without abnormal findings: Secondary | ICD-10-CM

## 2022-08-31 DIAGNOSIS — I499 Cardiac arrhythmia, unspecified: Secondary | ICD-10-CM | POA: Insufficient documentation

## 2022-08-31 DIAGNOSIS — E890 Postprocedural hypothyroidism: Secondary | ICD-10-CM | POA: Diagnosis not present

## 2022-08-31 NOTE — Assessment & Plan Note (Signed)
Discussed age-appropriate immunizations and screening exams.  Patient is up-to-date on all age-appropriate immunizations she would like.  Did encourage patient to get the shingles vaccine.  Patient is up-to-date on CRC screening.  She declines breast cancer screening.  Patient is aged out of cervical cancer screening.  Patient was given information at discharge about preventative healthcare maintenance with anticipatory guidance

## 2022-08-31 NOTE — Assessment & Plan Note (Signed)
Patient currently maintained on levothyroxine 88 mcg daily.  Tolerating medication well.  Last TSH within normal limits

## 2022-08-31 NOTE — Assessment & Plan Note (Signed)
Patient currently maintained on atorvastatin.  Last lipid panel reviewed continue medication as prescribed

## 2022-08-31 NOTE — Progress Notes (Signed)
Established Patient Office Visit  Subjective   Patient ID: Nakai Ruffin Frederick, female    DOB: [birth year]  Age: 77 y.o. MRN: [id number]  Chief Complaint  Patient presents with   Transitions Of Care    HPI  Transfer of care: patient las saw by me on 06/20/2022 Last seen by Gweneth Dimitri on 07/09/2021 for a CPE  for complete physical and follow up of chronic conditions.  HTN: Patient currently maintained on atenolol-chlorthalidone 50-25mg   Hypothyroidism: Patient currently maintained on levothyroxine 88 mcg daily.  HLD: Patient currently maintained on atorvastatin 10 mg daily  Immunizations: -Tetanus: Completed in 2024 -Influenza: out of season  -Shingles: Discussed in office information given at discharge -Pneumonia: Completed prevnar 13  Diet: Fair diet. States that she will have an ensure and 2 slice of bread.  States she will have lunch and dinner. Coffee water and ensure  Exercise: No regular exercise. Exercises everyday can be walking  Eye exam: Completes annually. Wears glasses  Dental exam: Dentures, as needed  Colonoscopy: Completed in 11/2020, repeat in 5 years 2027 Lung Cancer Screening: N/A  Pap smear: Aged out  Dexa: 02/11/2021  Mammogram: refused   Sleep: states that she goes to bed around 2am and 9am. Feels rested does not snore        Review of Systems  Constitutional:  Negative for chills and fever.  Respiratory:  Negative for shortness of breath.   Cardiovascular:  Negative for chest pain and leg swelling.  Gastrointestinal:  Negative for abdominal pain, blood in stool, constipation, diarrhea, nausea and vomiting.       BM daily   Genitourinary:  Negative for dysuria and hematuria.  Neurological:  Negative for dizziness, tingling and headaches.  Psychiatric/Behavioral:  Negative for hallucinations and suicidal ideas.       Objective:     BP 138/80   Pulse (!) 55   Temp 97.6 F (36.4 C) (Temporal)   Ht 5\' 1"  (1.549 m)   Wt 166  lb (75.3 kg)   SpO2 98%   BMI 31.37 kg/m    Physical Exam Vitals and nursing note reviewed.  Constitutional:      Appearance: Normal appearance.  HENT:     Right Ear: Tympanic membrane, ear canal and external ear normal.     Left Ear: Tympanic membrane, ear canal and external ear normal.     Mouth/Throat:     Mouth: Mucous membranes are moist.     Pharynx: Oropharynx is clear.  Eyes:     Extraocular Movements: Extraocular movements intact.     Pupils: Pupils are equal, round, and reactive to light.  Cardiovascular:     Rate and Rhythm: Normal rate. Rhythm irregular.     Pulses: Normal pulses.     Heart sounds: Normal heart sounds.  Pulmonary:     Effort: Pulmonary effort is normal.     Breath sounds: Normal breath sounds.  Abdominal:     General: Bowel sounds are normal. There is no distension.     Palpations: There is no mass.     Tenderness: There is no abdominal tenderness.     Hernia: No hernia is present.  Musculoskeletal:     Right lower leg: No edema.     Left lower leg: No edema.  Lymphadenopathy:     Cervical: No cervical adenopathy.  Skin:    General: Skin is warm.  Neurological:     General: No focal deficit present.     Mental Status: She  is alert.     Deep Tendon Reflexes:     Reflex Scores:      Bicep reflexes are 2+ on the right side and 2+ on the left side.      Patellar reflexes are 2+ on the right side and 2+ on the left side.    Comments: Bilateral upper and lower extremity strength 5/5  Psychiatric:        Mood and Affect: Mood normal.        Behavior: Behavior normal.        Thought Content: Thought content normal.        Judgment: Judgment normal.      No results found for any visits on 08/31/22.    The ASCVD Risk score (Arnett DK, et al., 2019) failed to calculate for the following reasons:   The valid total cholesterol range is 130 to 320 mg/dL    Assessment & Plan:   Problem List Items Addressed This Visit        Cardiovascular and Mediastinum   Hypertension    Patient currently maintained on atenolol-chlorthalidone 50-25 mg.  Patient tolerates medication well.  Blood pressure under good control.  Continue medication as prescribed        Endocrine   Hypothyroidism, postsurgical    Patient currently maintained on levothyroxine 88 mcg daily.  Tolerating medication well.  Last TSH within normal limits        Other   S/p nephrectomy   Mixed hyperlipidemia    Patient currently maintained on atorvastatin.  Last lipid panel reviewed continue medication as prescribed      Preventative health care - Primary    Discussed age-appropriate immunizations and screening exams.  Patient is up-to-date on all age-appropriate immunizations she would like.  Did encourage patient to get the shingles vaccine.  Patient is up-to-date on CRC screening.  She declines breast cancer screening.  Patient is aged out of cervical cancer screening.  Patient was given information at discharge about preventative healthcare maintenance with anticipatory guidance      Irregular heart beat    Incidental finding on auscultation with exam.  EKG in office      Relevant Orders   EKG 12-Lead (Completed)   Other Visit Diagnoses     PVC (premature ventricular contraction)           Return in about 6 months (around 03/03/2023) for BP recheck/ lab recheck .    [name], NP

## 2022-08-31 NOTE — Assessment & Plan Note (Signed)
Incidental finding on auscultation with exam.  EKG in office

## 2022-08-31 NOTE — Patient Instructions (Signed)
Nice to see you today Consider getting the Shingles vaccine (shingrix) at your local pharmacy  Follow up with me in 6 months, sooner if you need me

## 2022-08-31 NOTE — Assessment & Plan Note (Signed)
Patient currently maintained on atenolol-chlorthalidone 50-25 mg.  Patient tolerates medication well.  Blood pressure under good control.  Continue medication as prescribed

## 2022-09-01 ENCOUNTER — Encounter (INDEPENDENT_AMBULATORY_CARE_PROVIDER_SITE_OTHER): Payer: Self-pay

## 2022-09-10 ENCOUNTER — Other Ambulatory Visit: Payer: Self-pay | Admitting: Nurse Practitioner

## 2022-09-10 DIAGNOSIS — I10 Essential (primary) hypertension: Secondary | ICD-10-CM

## 2022-09-10 DIAGNOSIS — E876 Hypokalemia: Secondary | ICD-10-CM

## 2022-09-12 ENCOUNTER — Other Ambulatory Visit: Payer: Self-pay | Admitting: Family Medicine

## 2022-09-12 DIAGNOSIS — E876 Hypokalemia: Secondary | ICD-10-CM

## 2022-09-12 DIAGNOSIS — I10 Essential (primary) hypertension: Secondary | ICD-10-CM

## 2022-09-12 NOTE — Telephone Encounter (Signed)
Prescription Request  09/12/2022  LOV: Visit date not found  What is the name of the medication or equipment? potassium citrate (UROCIT-K) 10 MEQ (1080 MG) SR tablet   atenolol-chlorthalidone (TENORETIC) 50-25 MG tablet    Have you contacted your pharmacy to request a refill? Yes   Which pharmacy would you like this sent to?  [facility] [address] ([address]), [place] - [address] [place]) [place] [address] Phone: [phone] Fax: [phone]    Patient notified that their request is being sent to the clinical staff for review and that they should receive a response within 2 business days.   Please advise at Mobile [phone] (mobile)

## 2022-09-13 MED ORDER — ATENOLOL-CHLORTHALIDONE 50-25 MG PO TABS: 1.0000 | ORAL_TABLET | Freq: Every day | ORAL | 1 refills | Status: DC

## 2022-09-13 MED ORDER — POTASSIUM CITRATE ER 10 MEQ (1080 MG) PO TBCR: 10.0000 meq | EXTENDED_RELEASE_TABLET | Freq: Every day | ORAL | 1 refills | Status: DC

## 2022-09-21 ENCOUNTER — Ambulatory Visit (INDEPENDENT_AMBULATORY_CARE_PROVIDER_SITE_OTHER): Payer: Medicare HMO

## 2022-09-21 VITALS — BP 136/78 | HR 58 | Ht 61.0 in | Wt 164.6 lb

## 2022-09-21 DIAGNOSIS — Z0000 Encounter for general adult medical examination without abnormal findings: Secondary | ICD-10-CM

## 2022-09-21 NOTE — Progress Notes (Signed)
Subjective:   Rachel Quinn is a 77 y.o. female who presents for Medicare Annual (Subsequent) preventive examination.  Visit Complete: In person   Review of Systems      Cardiac Risk Factors include: advanced age (>39men, >27 women);hypertension;dyslipidemia     Objective:    Today's Vitals   09/21/22 1323  BP: 136/78  Pulse: (!) 58  SpO2: 98%  Weight: 164 lb 9.6 oz (74.7 kg)  Height: 5\' 1"  (1.549 m)   Body mass index is 31.1 kg/m.     09/21/2022    1:36 PM 06/01/2021   10:34 AM 03/31/2020    3:37 PM  Advanced Directives  Does Patient Have a Medical Advance Directive? No No No  Would patient like information on creating a medical advance directive? No - Patient declined No - Patient declined Yes (MAU/Ambulatory/Procedural Areas - Information given)    Current Medications (verified) Outpatient Encounter Medications as of 09/21/2022  Medication Sig   atenolol-chlorthalidone (TENORETIC) 50-25 MG tablet Take 1 tablet by mouth daily.   atorvastatin (LIPITOR) 10 MG tablet Take 1 tablet (10 mg total) by mouth daily.   calcium carbonate (OS-CAL - DOSED IN MG OF ELEMENTAL CALCIUM) 1250 (500 Ca) MG tablet Take 1 tablet by mouth.   levothyroxine (SYNTHROID) 88 MCG tablet Take 1 tablet by mouth once daily   potassium citrate (UROCIT-K) 10 MEQ (1080 MG) SR tablet Take 1 tablet (10 mEq total) by mouth daily.   albuterol (VENTOLIN HFA) 108 (90 Base) MCG/ACT inhaler Inhale 2 puffs into the lungs every 6 (six) hours as needed for wheezing or shortness of breath (Cough). (Patient not taking: Reported on 06/20/2022)   No facility-administered encounter medications on file as of 09/21/2022.    Allergies (verified) Patient has no known allergies.   History: Past Medical History:  Diagnosis Date   Cataract    Chronic kidney disease    Heart murmur    Hypertension    Kidney stone    Thyroid disease    Past Surgical History:  Procedure Laterality Date   CATARACT EXTRACTION  Bilateral 01/2020   KIDNEY SURGERY  2000   NEPHRECTOMY Left 01/18/1999   2/2 nephrolithiasis   THYROIDECTOMY  01/17/2002   TUBAL LIGATION     Family History  Problem Relation Age of Onset   Healthy Mother    Healthy Father    Cancer Neg Hx    Heart attack Neg Hx    Stroke Neg Hx    Colon cancer Neg Hx    Esophageal cancer Neg Hx    Rectal cancer Neg Hx    Stomach cancer Neg Hx    Social History   Socioeconomic History   Marital status: Married    Spouse name: Not on file   Number of children: 9   Years of education: college   Highest education level: Not on file  Occupational History   Not on file  Tobacco Use   Smoking status: Never   Smokeless tobacco: Never  Vaping Use   Vaping status: Never Used  Substance and Sexual Activity   Alcohol use: No    Alcohol/week: 0.0 standard drinks of alcohol   Drug use: No   Sexual activity: Not Currently  Other Topics Concern   Not on file  Social History Narrative   Lives with son [name]) and his family   [place] moved to the [place] 20 years ago   Enjoys: spending time with family, church Sunday, helping with the  family business   Exercise: taking care of her granddaughter (10 years old), walking daily   Diet: rice, fish, chicken, veggies, fruit   Social Determinants of Health   Financial Resource Strain: Low Risk  (09/21/2022)   Overall Financial Resource Strain (CARDIA)    Difficulty of Paying Living Expenses: Not hard at all  Food Insecurity: No Food Insecurity (09/21/2022)   Hunger Vital Sign    Worried About Running Out of Food in the Last Year: Never true    Ran Out of Food in the Last Year: Never true  Transportation Needs: No Transportation Needs (09/21/2022)   PRAPARE - Administrator, Civil Service (Medical): No    Lack of Transportation (Non-Medical): No  Physical Activity: Sufficiently Active (09/21/2022)   Exercise Vital Sign    Days of Exercise per Week: 6 days    Minutes of Exercise per  Session: 30 min  Stress: No Stress Concern Present (09/21/2022)   [employer] of Occupational Health - Occupational Stress Questionnaire    Feeling of Stress : Not at all  Social Connections: Moderately Integrated (09/21/2022)   Social Connection and Isolation Panel [NHANES]    Frequency of Communication with Friends and Family: More than three times a week    Frequency of Social Gatherings with Friends and Family: More than three times a week    Attends Religious Services: More than 4 times per year    Active Member of [employer] or Organizations: No    Attends Engineer, structural: Never    Marital Status: Married    Tobacco Counseling Counseling given: Not Answered   Clinical Intake:  Pre-visit preparation completed: Yes  Pain : No/denies pain     BMI - recorded: 31.1 Nutritional Status: BMI > 30  Obese Nutritional Risks: None Diabetes: No  How often do you need to have someone help you when you read instructions, pamphlets, or other written materials from your doctor or pharmacy?: 1 - Never  Interpreter Needed?: No  Information entered by :: C.Kandas Oliveto LPN   Activities of Daily Living    09/21/2022    1:37 PM  In your present state of health, do you have any difficulty performing the following activities:  Hearing? 0  Vision? 0  Difficulty concentrating or making decisions? 0  Walking or climbing stairs? 0  Dressing or bathing? 0  Doing errands, shopping? 0  Preparing Food and eating ? N  Using the Toilet? N  In the past six months, have you accidently leaked urine? N  Do you have problems with loss of bowel control? N  Managing your Medications? N  Managing your Finances? N  Housekeeping or managing your Housekeeping? N    Patient Care Team: Eden Emms, NP as PCP - General (Nurse Practitioner) Mia Creek, MD as Consulting Physician (Ophthalmology)  Indicate any recent Medical Services you may have received from other than [facility] providers  in the past year (date may be approximate).     Assessment:   This is a routine wellness examination for Rachel Quinn.  Hearing/Vision screen Hearing Screening - Comments:: Denies hearing difficulties   Vision Screening - Comments:: Glasses - [facility] - UTD on eye exam  Dietary issues and exercise activities discussed:     Goals Addressed             This Visit's Progress    Patient Stated       Maintain current health status       Depression Screen  09/21/2022    1:30 PM 08/31/2022    2:05 PM 06/20/2022   10:42 AM 06/01/2021   10:42 AM 03/31/2020    3:39 PM 06/13/2017    4:04 PM 04/10/2017   11:02 AM  PHQ 2/9 Scores  PHQ - 2 Score 0 0 0 0 0 0 0  PHQ- 9 Score 0 0 0        Fall Risk    09/21/2022    1:37 PM 08/31/2022    2:06 PM 06/20/2022   10:42 AM 06/01/2021   10:36 AM 03/31/2020    3:39 PM  Fall Risk   Falls in the past year? 0 0 0 0 0  Number falls in past yr: 0 0 0 0 0  Injury with Fall? 0 0 0 0 0  Risk for fall due to : No Fall Risks No Fall Risks No Fall Risks  No Fall Risks  Follow up Falls prevention discussed;Falls evaluation completed Falls evaluation completed Falls evaluation completed Falls evaluation completed;Education provided;Falls prevention discussed Falls evaluation completed    MEDICARE RISK AT HOME: Medicare Risk at Home Any stairs in or around the home?: Yes If so, are there any without handrails?: No Home free of loose throw rugs in walkways, pet beds, electrical cords, etc?: Yes Adequate lighting in your home to reduce risk of falls?: Yes Life alert?: No Use of a cane, walker or w/c?: No Grab bars in the bathroom?: Yes Shower chair or bench in shower?: Yes Elevated toilet seat or a handicapped toilet?: No  TIMED UP AND GO:  Was the test performed?  Yes  Length of time to ambulate 10 feet: 11 sec Gait steady and fast without use of assistive device    Cognitive Function:        09/21/2022    1:39 PM 06/01/2021   10:37 AM  6CIT  Screen  What Year? 0 points 0 points  What month? 0 points 0 points  What time? 0 points 0 points  Count back from 20 0 points 0 points  Months in reverse 4 points 0 points  Repeat phrase 6 points 6 points  Total Score 10 points 6 points    Immunizations Immunization History  Administered Date(s) Administered   Fluad Quad(high Dose 65+) 12/22/2019   Influenza Split 11/09/2016   Influenza, High Dose Seasonal PF 11/11/2018   Influenza,inj,Quad PF,6+ Mos 09/15/2017   Pneumococcal Conjugate-13 09/15/2017   Tdap 08/18/2022    TDAP status: Up to date  Flu Vaccine status: Declined, Education has been provided regarding the importance of this vaccine but patient still declined. Advised may receive this vaccine at local pharmacy or Health Dept. Aware to provide a copy of the vaccination record if obtained from local pharmacy or Health Dept. Verbalized acceptance and understanding.  Pneumococcal vaccine status: Declined,  Education has been provided regarding the importance of this vaccine but patient still declined. Advised may receive this vaccine at local pharmacy or Health Dept. Aware to provide a copy of the vaccination record if obtained from local pharmacy or Health Dept. Verbalized acceptance and understanding.   Covid-19 vaccine status: Declined, Education has been provided regarding the importance of this vaccine but patient still declined. Advised may receive this vaccine at local pharmacy or Health Dept.or vaccine clinic. Aware to provide a copy of the vaccination record if obtained from local pharmacy or Health Dept. Verbalized acceptance and understanding.  Qualifies for Shingles Vaccine? Yes   Zostavax completed Yes   Shingrix Completed?: No.  Education has been provided regarding the importance of this vaccine. Patient has been advised to call insurance company to determine out of pocket expense if they have not yet received this vaccine. Advised may also receive vaccine at  local pharmacy or Health Dept. Verbalized acceptance and understanding.  Screening Tests Health Maintenance  Topic Date Due   Zoster Vaccines- Shingrix (1 of 2) Never done   Pneumonia Vaccine 75+ Years old (2 of 2 - PPSV23 or PCV20) 09/16/2018   COVID-19 Vaccine (1 - 2023-24 season) Never done   INFLUENZA VACCINE  10/30/2022 (Originally 08/18/2022)   Medicare Annual Wellness (AWV)  09/21/2023   Colonoscopy  11/18/2025   DTaP/Tdap/Td (2 - Td or Tdap) 08/17/2032   DEXA SCAN  Completed   Hepatitis C Screening  Completed   HPV VACCINES  Aged Out    Health Maintenance  Health Maintenance Due  Topic Date Due   Zoster Vaccines- Shingrix (1 of 2) Never done   Pneumonia Vaccine 109+ Years old (2 of 2 - PPSV23 or PCV20) 09/16/2018   COVID-19 Vaccine (1 - 2023-24 season) Never done    Colorectal cancer screening: No longer required.   Mammogram status: No longer required due to patient declined.  Bone Density status: Completed 02/11/21. Results reflect: Bone density results: OSTEOPENIA. Repeat every 2 years.  Lung Cancer Screening: (Low Dose CT Chest recommended if Age 58-80 years, 20 pack-year currently smoking OR have quit w/in 15years.) does not qualify.   Lung Cancer Screening Referral:    Additional Screening:  Hepatitis C Screening: does qualify; Completed 06/13/17  Vision Screening: Recommended annual ophthalmology exams for early detection of glaucoma and other disorders of the eye. Is the patient up to date with their annual eye exam?  Yes  Who is the provider or what is the name of the office in which the patient attends annual eye exams? [facility] If pt is not established with a provider, would they like to be referred to a provider to establish care? Yes .   Dental Screening: Recommended annual dental exams for proper oral hygiene  Diabetic Foot Exam:   Community Resource Referral / Chronic Care Management: CRR required this visit?  No   CCM required this visit?   No     Plan:     I have personally reviewed and noted the following in the patient's chart:   Medical and social history Use of alcohol, tobacco or illicit drugs  Current medications and supplements including opioid prescriptions. Patient is not currently taking opioid prescriptions. Functional ability and status Nutritional status Physical activity Advanced directives List of other physicians Hospitalizations, surgeries, and ER visits in previous 12 months Vitals Screenings to include cognitive, depression, and falls Referrals and appointments  In addition, I have reviewed and discussed with patient certain preventive protocols, quality metrics, and best practice recommendations. A written personalized care plan for preventive services as well as general preventive health recommendations were provided to patient.     Maryan Puls, LPN   06/21/7844   After Visit Summary: declined, avaiable in MyChart  Nurse Notes: Vaccinations: declines all Influenza vaccine: recommend every Fall Pneumococcal vaccine: recommend once per lifetime Prevnar-20 Shingles vaccine: recommend Shingrix which is 2 doses 2-6 months apart and over 90% effective     Covid-19: recommend 2 doses one month apart with a booster 6 months later

## 2022-09-21 NOTE — Patient Instructions (Signed)
Ms. Brock Ra , Thank you for taking time to come for your Medicare Wellness Visit. I appreciate your ongoing commitment to your health goals. Please review the following plan we discussed and let me know if I can assist you in the future.   Referrals/Orders/Follow-Ups/Clinician Recommendations: Aim for 30 minutes of exercise or brisk walking, 6-8 glasses of water, and 5 servings of fruits and vegetables each day.   This is a list of the screening recommended for you and due dates:  Health Maintenance  Topic Date Due   Zoster (Shingles) Vaccine (1 of 2) Never done   Pneumonia Vaccine (2 of 2 - PPSV23 or PCV20) 09/16/2018   Medicare Annual Wellness Visit  06/02/2022   COVID-19 Vaccine (1 - 2023-24 season) Never done   Flu Shot  10/30/2022*   Colon Cancer Screening  11/18/2025   DTaP/Tdap/Td vaccine (2 - Td or Tdap) 08/17/2032   DEXA scan (bone density measurement)  Completed   Hepatitis C Screening  Completed   HPV Vaccine  Aged Out  *Topic was postponed. The date shown is not the original due date.    Advanced directives: (Declined) Advance directive discussed with you today. Even though you declined this today, please call our office should you change your mind, and we can give you the proper paperwork for you to fill out.  Next Medicare Annual Wellness Visit scheduled for next year: Yes

## 2022-10-28 DIAGNOSIS — S199XXA Unspecified injury of neck, initial encounter: Secondary | ICD-10-CM | POA: Diagnosis not present

## 2022-10-28 DIAGNOSIS — S20219A Contusion of unspecified front wall of thorax, initial encounter: Secondary | ICD-10-CM | POA: Diagnosis not present

## 2022-10-28 DIAGNOSIS — S6992XA Unspecified injury of left wrist, hand and finger(s), initial encounter: Secondary | ICD-10-CM | POA: Diagnosis not present

## 2022-10-28 DIAGNOSIS — E7800 Pure hypercholesterolemia, unspecified: Secondary | ICD-10-CM | POA: Diagnosis not present

## 2022-10-28 DIAGNOSIS — S60222A Contusion of left hand, initial encounter: Secondary | ICD-10-CM | POA: Diagnosis not present

## 2022-10-28 DIAGNOSIS — S299XXA Unspecified injury of thorax, initial encounter: Secondary | ICD-10-CM | POA: Diagnosis not present

## 2022-10-28 DIAGNOSIS — G8911 Acute pain due to trauma: Secondary | ICD-10-CM | POA: Diagnosis not present

## 2022-10-28 DIAGNOSIS — M5459 Other low back pain: Secondary | ICD-10-CM | POA: Diagnosis not present

## 2022-10-28 DIAGNOSIS — S2220XA Unspecified fracture of sternum, initial encounter for closed fracture: Secondary | ICD-10-CM | POA: Diagnosis not present

## 2022-10-28 DIAGNOSIS — R1013 Epigastric pain: Secondary | ICD-10-CM | POA: Diagnosis not present

## 2022-10-28 DIAGNOSIS — S79922A Unspecified injury of left thigh, initial encounter: Secondary | ICD-10-CM | POA: Diagnosis not present

## 2022-10-28 DIAGNOSIS — S3993XA Unspecified injury of pelvis, initial encounter: Secondary | ICD-10-CM | POA: Diagnosis not present

## 2022-10-28 DIAGNOSIS — E876 Hypokalemia: Secondary | ICD-10-CM | POA: Diagnosis not present

## 2022-10-28 DIAGNOSIS — R0602 Shortness of breath: Secondary | ICD-10-CM | POA: Diagnosis not present

## 2022-10-28 DIAGNOSIS — R9431 Abnormal electrocardiogram [ECG] [EKG]: Secondary | ICD-10-CM | POA: Diagnosis not present

## 2022-10-28 DIAGNOSIS — I672 Cerebral atherosclerosis: Secondary | ICD-10-CM | POA: Diagnosis not present

## 2022-10-28 DIAGNOSIS — Z905 Acquired absence of kidney: Secondary | ICD-10-CM | POA: Insufficient documentation

## 2022-10-28 DIAGNOSIS — E039 Hypothyroidism, unspecified: Secondary | ICD-10-CM | POA: Diagnosis not present

## 2022-10-28 DIAGNOSIS — R079 Chest pain, unspecified: Secondary | ICD-10-CM | POA: Diagnosis not present

## 2022-10-28 DIAGNOSIS — R42 Dizziness and giddiness: Secondary | ICD-10-CM | POA: Insufficient documentation

## 2022-10-28 DIAGNOSIS — I10 Essential (primary) hypertension: Secondary | ICD-10-CM | POA: Diagnosis not present

## 2022-10-28 DIAGNOSIS — S0990XA Unspecified injury of head, initial encounter: Secondary | ICD-10-CM | POA: Diagnosis not present

## 2022-10-28 DIAGNOSIS — R0789 Other chest pain: Secondary | ICD-10-CM | POA: Diagnosis not present

## 2022-10-28 DIAGNOSIS — S2242XA Multiple fractures of ribs, left side, initial encounter for closed fracture: Secondary | ICD-10-CM | POA: Diagnosis not present

## 2022-10-28 DIAGNOSIS — R001 Bradycardia, unspecified: Secondary | ICD-10-CM | POA: Diagnosis not present

## 2022-10-28 DIAGNOSIS — S3991XA Unspecified injury of abdomen, initial encounter: Secondary | ICD-10-CM | POA: Diagnosis not present

## 2022-10-29 DIAGNOSIS — G8911 Acute pain due to trauma: Secondary | ICD-10-CM | POA: Diagnosis not present

## 2022-10-29 DIAGNOSIS — S2220XA Unspecified fracture of sternum, initial encounter for closed fracture: Secondary | ICD-10-CM | POA: Diagnosis not present

## 2022-10-29 DIAGNOSIS — S2242XA Multiple fractures of ribs, left side, initial encounter for closed fracture: Secondary | ICD-10-CM | POA: Diagnosis not present

## 2022-10-31 DIAGNOSIS — S2220XA Unspecified fracture of sternum, initial encounter for closed fracture: Secondary | ICD-10-CM | POA: Diagnosis not present

## 2022-10-31 DIAGNOSIS — E876 Hypokalemia: Secondary | ICD-10-CM | POA: Diagnosis not present

## 2022-10-31 DIAGNOSIS — G8911 Acute pain due to trauma: Secondary | ICD-10-CM | POA: Diagnosis not present

## 2022-10-31 DIAGNOSIS — S2242XA Multiple fractures of ribs, left side, initial encounter for closed fracture: Secondary | ICD-10-CM | POA: Diagnosis not present

## 2022-11-01 DIAGNOSIS — R001 Bradycardia, unspecified: Secondary | ICD-10-CM | POA: Diagnosis not present

## 2022-11-01 DIAGNOSIS — G8911 Acute pain due to trauma: Secondary | ICD-10-CM | POA: Diagnosis not present

## 2022-11-01 DIAGNOSIS — R079 Chest pain, unspecified: Secondary | ICD-10-CM | POA: Diagnosis not present

## 2022-11-01 DIAGNOSIS — S2220XA Unspecified fracture of sternum, initial encounter for closed fracture: Secondary | ICD-10-CM | POA: Diagnosis not present

## 2022-11-01 DIAGNOSIS — S2242XA Multiple fractures of ribs, left side, initial encounter for closed fracture: Secondary | ICD-10-CM | POA: Diagnosis not present

## 2022-11-01 DIAGNOSIS — R9431 Abnormal electrocardiogram [ECG] [EKG]: Secondary | ICD-10-CM | POA: Diagnosis not present

## 2022-11-02 DIAGNOSIS — S2220XA Unspecified fracture of sternum, initial encounter for closed fracture: Secondary | ICD-10-CM | POA: Diagnosis not present

## 2022-11-02 DIAGNOSIS — G8911 Acute pain due to trauma: Secondary | ICD-10-CM | POA: Diagnosis not present

## 2022-11-02 DIAGNOSIS — S2242XA Multiple fractures of ribs, left side, initial encounter for closed fracture: Secondary | ICD-10-CM | POA: Diagnosis not present

## 2022-11-10 ENCOUNTER — Encounter: Payer: Self-pay | Admitting: Nurse Practitioner

## 2022-11-10 ENCOUNTER — Ambulatory Visit (INDEPENDENT_AMBULATORY_CARE_PROVIDER_SITE_OTHER): Payer: Medicare HMO | Admitting: Nurse Practitioner

## 2022-11-10 VITALS — BP 138/80 | HR 55 | Temp 97.7°F | Ht 61.0 in | Wt 167.2 lb

## 2022-11-10 DIAGNOSIS — S2220XA Unspecified fracture of sternum, initial encounter for closed fracture: Secondary | ICD-10-CM | POA: Diagnosis not present

## 2022-11-10 DIAGNOSIS — S2242XA Multiple fractures of ribs, left side, initial encounter for closed fracture: Secondary | ICD-10-CM | POA: Insufficient documentation

## 2022-11-10 DIAGNOSIS — S2242XD Multiple fractures of ribs, left side, subsequent encounter for fracture with routine healing: Secondary | ICD-10-CM

## 2022-11-10 MED ORDER — TRAMADOL HCL 50 MG PO TABS: 50.0000 mg | ORAL_TABLET | Freq: Three times a day (TID) | ORAL | 0 refills | Status: AC | PRN

## 2022-11-10 NOTE — Patient Instructions (Signed)
Nice to see you today I have referred you to the bone specialist Follow up with me in February as scheduled, sooner if you need me
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
## 2022-11-14 ENCOUNTER — Telehealth: Payer: Self-pay | Admitting: Nurse Practitioner

## 2022-11-14 DIAGNOSIS — S2242XS Multiple fractures of ribs, left side, sequela: Secondary | ICD-10-CM

## 2022-11-14 DIAGNOSIS — S2220XA Unspecified fracture of sternum, initial encounter for closed fracture: Secondary | ICD-10-CM

## 2022-11-14 DIAGNOSIS — E890 Postprocedural hypothyroidism: Secondary | ICD-10-CM

## 2022-11-14 MED ORDER — ALBUTEROL SULFATE HFA 108 (90 BASE) MCG/ACT IN AERS: 2.0000 | INHALATION_SPRAY | Freq: Four times a day (QID) | RESPIRATORY_TRACT | 0 refills | Status: DC | PRN

## 2022-11-14 NOTE — Telephone Encounter (Signed)
Prescription Request  11/14/2022  LOV: 11/10/2022  What is the name of the medication or equipment? albuterol (VENTOLIN HFA) 108 (90 Base) MCG/ACT inhaler   Have you contacted your pharmacy to request a refill? No   Which pharmacy would you like this sent to?  [facility] [address] ([address]), [place] - [address] [place]) [place] [address] Phone: [phone] Fax: [phone]    Patient notified that their request is being sent to the clinical staff for review and that they should receive a response within 2 business days.   Please advise at [facility] [phone]
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
## 2022-11-14 NOTE — Telephone Encounter (Signed)
Can we call and get clarification about not seeing the patient please

## 2022-11-14 NOTE — Telephone Encounter (Signed)
Contacted pt to verify. [name] on DPR Rachel Quinn) stated that pt has begun coughing and would need either cough medication or inhaler.  Advised Rachel Quinn that inhaler script was sent in today. Pt understood/verbalized. No questions or concerns at this time.
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
## 2022-11-15 ENCOUNTER — Ambulatory Visit: Payer: Medicare HMO | Admitting: Nurse Practitioner
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
## 2022-11-16 NOTE — Addendum Note (Signed)
Addended by: Eden Emms on: 11/16/2022 01:55 PM   Modules accepted: Orders

## 2022-11-29 ENCOUNTER — Ambulatory Visit: Payer: Medicare HMO | Admitting: Orthopaedic Surgery

## 2022-11-29 ENCOUNTER — Encounter: Payer: Self-pay | Admitting: Orthopaedic Surgery

## 2022-11-29 ENCOUNTER — Other Ambulatory Visit (INDEPENDENT_AMBULATORY_CARE_PROVIDER_SITE_OTHER): Payer: Self-pay

## 2022-11-29 VITALS — BP 164/79 | HR 65 | Ht 61.0 in | Wt 167.0 lb

## 2022-11-29 DIAGNOSIS — R0781 Pleurodynia: Secondary | ICD-10-CM

## 2022-11-29 DIAGNOSIS — R0789 Other chest pain: Secondary | ICD-10-CM

## 2022-11-29 DIAGNOSIS — S2242XA Multiple fractures of ribs, left side, initial encounter for closed fracture: Secondary | ICD-10-CM

## 2022-11-29 DIAGNOSIS — S2249XA Multiple fractures of ribs, unspecified side, initial encounter for closed fracture: Secondary | ICD-10-CM | POA: Insufficient documentation

## 2022-11-29 DIAGNOSIS — S2222XA Fracture of body of sternum, initial encounter for closed fracture: Secondary | ICD-10-CM | POA: Diagnosis not present
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
## 2022-12-17 ENCOUNTER — Ambulatory Visit
Admission: EM | Admit: 2022-12-17 | Discharge: 2022-12-17 | Disposition: A | Discharge status: Home / Self Care | Payer: Medicare HMO | Attending: Physician Assistant | Admitting: Physician Assistant

## 2022-12-17 ENCOUNTER — Ambulatory Visit (INDEPENDENT_AMBULATORY_CARE_PROVIDER_SITE_OTHER): Payer: Medicare HMO

## 2022-12-17 ENCOUNTER — Other Ambulatory Visit: Payer: Self-pay

## 2022-12-17 ENCOUNTER — Encounter: Payer: Self-pay | Admitting: Emergency Medicine

## 2022-12-17 DIAGNOSIS — R059 Cough, unspecified: Secondary | ICD-10-CM | POA: Diagnosis not present

## 2022-12-17 DIAGNOSIS — J209 Acute bronchitis, unspecified: Secondary | ICD-10-CM

## 2022-12-17 DIAGNOSIS — R062 Wheezing: Secondary | ICD-10-CM | POA: Diagnosis not present

## 2022-12-17 DIAGNOSIS — I517 Cardiomegaly: Secondary | ICD-10-CM | POA: Diagnosis not present

## 2022-12-17 DIAGNOSIS — R918 Other nonspecific abnormal finding of lung field: Secondary | ICD-10-CM | POA: Diagnosis not present

## 2022-12-17 MED ORDER — ALBUTEROL SULFATE HFA 108 (90 BASE) MCG/ACT IN AERS: 2.0000 | INHALATION_SPRAY | Freq: Four times a day (QID) | RESPIRATORY_TRACT | 0 refills | Status: DC | PRN

## 2022-12-17 MED ORDER — PREDNISONE 20 MG PO TABS: 40.0000 mg | ORAL_TABLET | Freq: Every day | ORAL | 0 refills | Status: AC
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
## 2022-12-17 NOTE — Discharge Instructions (Signed)
Please followup with your primary care provider next week.

## 2022-12-17 NOTE — ED Triage Notes (Signed)
Pt here for cough worsened 2 days ago; pt noted to have some wheezing

## 2022-12-19 DIAGNOSIS — R059 Cough, unspecified: Secondary | ICD-10-CM | POA: Diagnosis not present

## 2022-12-19 DIAGNOSIS — I10 Essential (primary) hypertension: Secondary | ICD-10-CM | POA: Diagnosis not present

## 2022-12-19 DIAGNOSIS — J208 Acute bronchitis due to other specified organisms: Secondary | ICD-10-CM | POA: Diagnosis not present

## 2022-12-19 DIAGNOSIS — E6609 Other obesity due to excess calories: Secondary | ICD-10-CM | POA: Diagnosis not present

## 2022-12-27 ENCOUNTER — Encounter: Payer: Self-pay | Admitting: Orthopaedic Surgery

## 2022-12-27 ENCOUNTER — Ambulatory Visit: Payer: Medicare HMO | Admitting: Orthopaedic Surgery

## 2022-12-27 VITALS — BP 154/80 | HR 55 | Ht 61.0 in | Wt 167.0 lb

## 2022-12-27 DIAGNOSIS — S2242XD Multiple fractures of ribs, left side, subsequent encounter for fracture with routine healing: Secondary | ICD-10-CM

## 2022-12-27 DIAGNOSIS — S2222XD Fracture of body of sternum, subsequent encounter for fracture with routine healing: Secondary | ICD-10-CM | POA: Diagnosis not present
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
## 2022-12-29 ENCOUNTER — Ambulatory Visit (INDEPENDENT_AMBULATORY_CARE_PROVIDER_SITE_OTHER): Payer: Medicare HMO | Admitting: Nurse Practitioner

## 2022-12-29 VITALS — BP 132/78 | HR 55 | Temp 98.2°F | Ht 61.0 in | Wt 165.2 lb

## 2022-12-29 DIAGNOSIS — J40 Bronchitis, not specified as acute or chronic: Secondary | ICD-10-CM | POA: Diagnosis not present

## 2022-12-29 DIAGNOSIS — R0609 Other forms of dyspnea: Secondary | ICD-10-CM | POA: Diagnosis not present

## 2022-12-29 DIAGNOSIS — R051 Acute cough: Secondary | ICD-10-CM | POA: Diagnosis not present

## 2022-12-29 MED ORDER — BENZONATATE 100 MG PO CAPS: 100.0000 mg | ORAL_CAPSULE | Freq: Two times a day (BID) | ORAL | 0 refills | Status: DC | PRN

## 2022-12-29 MED ORDER — PREDNISONE 20 MG PO TABS: ORAL_TABLET | ORAL | 0 refills | Status: AC

## 2022-12-29 MED ORDER — DOXYCYCLINE HYCLATE 100 MG PO TABS: 100.0000 mg | ORAL_TABLET | Freq: Two times a day (BID) | ORAL | 0 refills | Status: AC

## 2022-12-29 NOTE — Progress Notes (Signed)
Acute Office Visit  Subjective:     Patient ID: Rachel Quinn, female    DOB: 10-26-1945, 77 y.o.   MRN: [id number]  Chief Complaint  Patient presents with   Cough    Pt complains of cough for two weeks. Pt states she was given medication at urgent care but the cough is still persistent. Pt has started wheezing. Only has chest pain when coughing. States mucinex has helped a little.      Patient is in today for cough with history of cataracts, heart murmur, hypertension, hypothyroidism, nephrectomy.  Patient was seen at the urgent care on 12/17/2022 diagnosed with acute bronchitis patient was given prednisone a chest x-ray was performed that showed possible COPD.  Symptoms started approx 2 weeks. States that she went to the UC and was given prednisone. State that she wehtn to her husbands clinic and they gave her two injection and nebluizers. States that it did help. States that she si having clear and yellow phelgm  States that cough is worse at night time  Statse that she did try mucinex and albuterol   Review of Systems  Constitutional:  Negative for chills and fever.  HENT:  Positive for sore throat (with couhg). Negative for ear discharge, ear pain and sinus pain.   Respiratory:  Positive for cough, sputum production and shortness of breath (doe).   Cardiovascular:  Positive for chest pain (with cough).  Gastrointestinal:  Negative for abdominal pain, diarrhea, nausea and vomiting.  Musculoskeletal:  Negative for myalgias.  Neurological:  Negative for headaches.        Objective:    BP 132/78   Pulse (!) 55   Temp 98.2 F (36.8 C) (Oral)   Ht 5\' 1"  (1.549 m)   Wt 165 lb 3.2 oz (74.9 kg)   SpO2 98%   BMI 31.21 kg/m    Physical Exam Vitals and nursing note reviewed.  Constitutional:      Appearance: Normal appearance.  HENT:     Right Ear: Tympanic membrane, ear canal and external ear normal.     Left Ear: Tympanic membrane, ear canal and external  ear normal.     Nose:     Right Sinus: No maxillary sinus tenderness or frontal sinus tenderness.     Left Sinus: No maxillary sinus tenderness or frontal sinus tenderness.     Mouth/Throat:     Mouth: Mucous membranes are moist.     Pharynx: Oropharynx is clear.  Cardiovascular:     Rate and Rhythm: Normal rate and regular rhythm.     Heart sounds: Normal heart sounds.  Pulmonary:     Effort: Pulmonary effort is normal.     Breath sounds: Normal breath sounds.  Lymphadenopathy:     Cervical: No cervical adenopathy.  Neurological:     Mental Status: She is alert.     No results found for any visits on 12/29/22.      Assessment & Plan:   Problem List Items Addressed This Visit       Respiratory   Bronchitis - Primary   Patient was treated with prednisone and had albuterol at home.  Will retreat with prednisone and doxycycline 100 mg twice daily for 7 days.  Follow-up if no improvement      Relevant Medications   predniSONE (DELTASONE) 20 MG tablet   doxycycline (VIBRA-TABS) 100 MG tablet     Other   Acute cough   Tessalon Perles 100 mg 3 times daily as  needed.  Did review chest x-ray that was done at urgent care      Relevant Medications   benzonatate (TESSALON) 100 MG capsule   DOE (dyspnea on exertion)   Lungs clear to auscultation today.  Prednisone 20 mg taper as directed avoid NSAIDs take with food.      Relevant Medications   predniSONE (DELTASONE) 20 MG tablet    Meds ordered this encounter  Medications   predniSONE (DELTASONE) 20 MG tablet    Sig: Take 2 tablets (40 mg total) by mouth daily with breakfast for 3 days, THEN 1 tablet (20 mg total) daily with breakfast for 3 days. Avoid NSAIDs.    Dispense:  9 tablet    Refill:  0    Supervising Provider:   Roxy Manns A [[id number]]   doxycycline (VIBRA-TABS) 100 MG tablet    Sig: Take 1 tablet (100 mg total) by mouth 2 (two) times daily for 7 days.    Dispense:  14 tablet    Refill:  0    Supervising  Provider:   Roxy Manns A [[id number]]   benzonatate (TESSALON) 100 MG capsule    Sig: Take 1 capsule (100 mg total) by mouth 2 (two) times daily as needed for cough.    Dispense:  20 capsule    Refill:  0    Supervising Provider:   Roxy Manns A [[id number]]    Return if symptoms worsen or fail to improve, for keep appt as scheduled .  [name], NP

## 2022-12-29 NOTE — Patient Instructions (Signed)
Nice to see you today  I have sent medications to the pharmacy  Follow up if you do not start improving or get worse

## 2022-12-29 NOTE — Assessment & Plan Note (Signed)
Patient was treated with prednisone and had albuterol at home.  Will retreat with prednisone and doxycycline 100 mg twice daily for 7 days.  Follow-up if no improvement

## 2022-12-29 NOTE — Assessment & Plan Note (Signed)
Lungs clear to auscultation today.  Prednisone 20 mg taper as directed avoid NSAIDs take with food.

## 2022-12-29 NOTE — Assessment & Plan Note (Signed)
Tessalon Perles 100 mg 3 times daily as needed.  Did review chest x-ray that was done at urgent care

## 2023-01-21 ENCOUNTER — Other Ambulatory Visit: Payer: Self-pay | Admitting: Nurse Practitioner

## 2023-01-21 DIAGNOSIS — E890 Postprocedural hypothyroidism: Secondary | ICD-10-CM

## 2023-01-23 ENCOUNTER — Other Ambulatory Visit: Payer: Self-pay | Admitting: Nurse Practitioner

## 2023-01-23 DIAGNOSIS — E890 Postprocedural hypothyroidism: Secondary | ICD-10-CM

## 2023-01-23 MED ORDER — LEVOTHYROXINE SODIUM 88 MCG PO TABS: 88.0000 ug | ORAL_TABLET | Freq: Every day | ORAL | 1 refills | Status: DC

## 2023-01-23 NOTE — Telephone Encounter (Signed)
 Copied from CRM [id number]. Topic: Clinical - Medication Refill >> Jan 23, 2023  3:37 PM Isabell A wrote: Most Recent Primary Care Visit:  Provider: WENDEE LYNWOOD HERO  Department: [facility]  Visit Type: OFFICE VISIT  Date: 12/29/2022  Medication: levothyroxine  (SYNTHROID ) 88 MCG tablet  Has the patient contacted their pharmacy? Yes (Agent: If no, request that the patient contact the pharmacy for the refill. If patient does not wish to contact the pharmacy document the reason why and proceed with request.) (Agent: If yes, when and what did the pharmacy advise?) Pharmacy states they have not heard from the provider.   Is this the correct pharmacy for this prescription? Yes If no, delete pharmacy and type the correct one.  This is the patient's preferred pharmacy:  [facility] [address] ([address]), [place] - [address] [place] ([place]) [place] [address] Phone: [phone] Fax: [phone]   Has the prescription been filled recently? Yes  Is the patient out of the medication? Yes  Has the patient been seen for an appointment in the last year OR does the patient have an upcoming appointment? Yes  Can we respond through MyChart? No  Agent: Please be advised that Rx refills may take up to 3 business days. We ask that you follow-up with your pharmacy.

## 2023-01-24 ENCOUNTER — Ambulatory Visit (INDEPENDENT_AMBULATORY_CARE_PROVIDER_SITE_OTHER): Payer: Medicare HMO | Admitting: Family Medicine

## 2023-01-24 ENCOUNTER — Encounter: Payer: Self-pay | Admitting: Family Medicine

## 2023-01-24 VITALS — BP 130/82 | HR 68 | Temp 98.9°F | Ht 61.0 in | Wt 171.2 lb

## 2023-01-24 DIAGNOSIS — U071 COVID-19: Secondary | ICD-10-CM | POA: Insufficient documentation

## 2023-01-24 DIAGNOSIS — R051 Acute cough: Secondary | ICD-10-CM

## 2023-01-24 LAB — POCT INFLUENZA A/B
Influenza A, POC: NEGATIVE
Influenza B, POC: NEGATIVE

## 2023-01-24 LAB — POC COVID19 BINAXNOW: SARS Coronavirus 2 Ag: POSITIVE — AB

## 2023-01-24 MED ORDER — PREDNISONE 20 MG PO TABS: 20.0000 mg | ORAL_TABLET | Freq: Every day | ORAL | 0 refills | Status: DC

## 2023-01-24 MED ORDER — NIRMATRELVIR/RITONAVIR (PAXLOVID) TABLET (RENAL DOSING): 2.0000 | ORAL_TABLET | Freq: Two times a day (BID) | ORAL | 0 refills | Status: AC

## 2023-01-24 MED ORDER — ALBUTEROL SULFATE HFA 108 (90 BASE) MCG/ACT IN AERS: 2.0000 | INHALATION_SPRAY | Freq: Four times a day (QID) | RESPIRATORY_TRACT | 0 refills | Status: DC | PRN

## 2023-01-24 NOTE — Assessment & Plan Note (Signed)
 Mild to moderate symptoms in pt with history of reactive airways  Reassuring exam No fever today   In light of reactive airways- refilled albuterol  and prescribed prednisone  20 mg daily for 5 d with close follow up  Paxlovid  prescribed (renal dose) in light of age and risk factors, pt will get this if covered by her ins and affordable Given info on covid and paxlovid  and discussed possible side effects See AVS for additional symptom care recommendations   Reviewed isolation and masking recommendations and handout given   Update if not starting to improve in a week or if worsening  Call back and Er precautions noted in detail today  (any severe symptoms or shortness of breath will go to ER)

## 2023-01-24 NOTE — Progress Notes (Signed)
 Subjective:    Patient ID: Rachel Quinn, female    DOB: [birth year], 78 y.o.   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 presents with fever/ cough and fatigue   Covid positive today   Symptoms started yesterday  Had a fever-unsure how high  Body aches Very tired  Chills   Some cough  Mucous- clear  Some wheezing  No shortness of breath   Ears ok  Throat is ok    No nasal symptoms   Over the counter  Tylenol   Mucinex  dm -does help some    Results for orders placed or performed in visit on 01/24/23  POCT Influenza A/B   Collection Time: 01/24/23  3:11 PM  Result Value Ref Range   Influenza A, POC Negative Negative   Influenza B, POC Negative Negative  POC COVID-19   Collection Time: 01/24/23  3:11 PM  Result Value Ref Range   SARS Coronavirus 2 Ag Positive (A) Negative     Lab Results  Component Value Date   NA 139 06/20/2022   K 3.8 06/20/2022   CO2 31 06/20/2022   GLUCOSE 77 06/20/2022   BUN 22 06/20/2022   CREATININE 1.06 06/20/2022   CALCIUM  8.7 06/20/2022   GFR 51.38 (L) 06/20/2022   GFRNONAA 68 06/13/2017     Patient Active Problem List   Diagnosis Date Noted   COVID-19 01/24/2023   Bronchitis 12/29/2022   Acute cough 12/29/2022   DOE (dyspnea on exertion) 12/29/2022   Multiple rib fractures 11/29/2022   Closed fracture of sternum 11/10/2022   Multiple closed fractures of ribs of left side 11/10/2022   Preventative health care 08/31/2022   Irregular heart beat 08/31/2022   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   Mixed hyperlipidemia 02/07/2019   Bilateral foot pain 02/07/2019   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   Hypothyroidism, 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    Hypertension 04/30/2012   Past Medical History:  Diagnosis Date   Cataract    Chronic kidney disease    Heart murmur    Hypertension    Kidney stone    Thyroid  disease    Past Surgical History:  Procedure Laterality Date   CATARACT EXTRACTION Bilateral 01/2020   KIDNEY SURGERY  2000   NEPHRECTOMY Left 01/18/1999   2/2 nephrolithiasis   THYROIDECTOMY  01/17/2002   TUBAL LIGATION     Social History   Tobacco Use   Smoking status: Never   Smokeless tobacco: Never  Vaping Use   Vaping status: Never Used  Substance Use Topics   Alcohol use: No    Alcohol/week: 0.0 standard drinks of alcohol   Drug use: No   Family History  Problem Relation Age of Onset   Healthy Mother    Healthy Father    Cancer Neg Hx    Heart attack Neg Hx    Stroke Neg Hx    Colon cancer Neg Hx    Esophageal cancer Neg Hx    Rectal cancer Neg Hx    Stomach cancer Neg Hx    No Known Allergies Current Outpatient Medications on File Prior to Visit  Medication  Sig Dispense Refill   atenolol -chlorthalidone  (TENORETIC ) 50-25 MG tablet Take 1 tablet by mouth daily. 90 tablet 1   atorvastatin  (LIPITOR) 10 MG tablet Take 1 tablet (10 mg total) by mouth daily. 90 tablet 1   calcium  carbonate (OS-CAL - DOSED IN MG OF ELEMENTAL CALCIUM ) 1250 (500 Ca) MG tablet Take 1 tablet by mouth.     levothyroxine  (SYNTHROID ) 88 MCG tablet Take 1 tablet (88 mcg total) by mouth daily. 90 tablet 1   lidocaine  4 % Place onto the skin.     methocarbamol (ROBAXIN) 750 MG tablet Take 1 tablet by mouth 3 (three) times daily.     potassium citrate  (UROCIT-K ) 10 MEQ (1080 MG) SR tablet Take 1 tablet (10 mEq total) by mouth daily. 90 tablet 1   No current facility-administered medications on file prior to visit.    Review of Systems  Constitutional:  Positive for fatigue and fever. Negative for appetite change.  HENT:  Positive for postnasal drip. Negative for congestion, ear pain, rhinorrhea, sinus pressure, sneezing and  sore throat.   Eyes:  Negative for pain and discharge.  Respiratory:  Positive for cough and wheezing. Negative for shortness of breath and stridor.   Cardiovascular:  Negative for chest pain.  Gastrointestinal:  Negative for diarrhea, nausea and vomiting.  Genitourinary:  Negative for frequency, hematuria and urgency.  Musculoskeletal:  Negative for arthralgias and myalgias.  Skin:  Negative for rash.  Neurological:  Positive for headaches. Negative for dizziness, weakness and light-headedness.  Psychiatric/Behavioral:  Negative for confusion and dysphoric mood.        Objective:   Physical Exam Constitutional:      General: She is not in acute distress.    Appearance: Normal appearance. She is well-developed. She is obese. She is not ill-appearing, toxic-appearing or diaphoretic.  HENT:     Head: Normocephalic and atraumatic.     Comments: Nares are injected and congested      Right Ear: Tympanic membrane, ear canal and external ear normal.     Left Ear: Tympanic membrane, ear canal and external ear normal.     Nose: No congestion.     Comments: Nares are boggy but clear     Mouth/Throat:     Mouth: Mucous membranes are moist.     Pharynx: Oropharynx is clear. No oropharyngeal exudate or posterior oropharyngeal erythema.     Comments: Clear pnd  Eyes:     General:        Right eye: No discharge.        Left eye: No discharge.     Conjunctiva/sclera: Conjunctivae normal.     Pupils: Pupils are equal, round, and reactive to light.  Cardiovascular:     Rate and Rhythm: Normal rate.     Heart sounds: Normal heart sounds.  Pulmonary:     Effort: Pulmonary effort is normal. No respiratory distress.     Breath sounds: Normal breath sounds. No stridor. No wheezing, rhonchi or rales.     Comments:  No wheeze even on forced expiration  Few scattered rhonchi  Some upper airway sounds cleared by cough  No prolonged exp phase  No rales    Chest:     Chest wall: No tenderness.   Musculoskeletal:     Cervical back: Normal range of motion and neck supple.  Lymphadenopathy:     Cervical: No cervical adenopathy.  Skin:    General: Skin is warm and dry.     Capillary Refill: Capillary  refill takes less than 2 seconds.     Findings: No rash.  Neurological:     Mental Status: She is alert.     Cranial Nerves: No cranial nerve deficit.  Psychiatric:        Mood and Affect: Mood normal.           Assessment & Plan:   Problem List Items Addressed This Visit       Other   COVID-19 - Primary   Mild to moderate symptoms in pt with history of reactive airways  Reassuring exam No fever today   In light of reactive airways- refilled albuterol  and prescribed prednisone  20 mg daily for 5 d with close follow up  Paxlovid  prescribed (renal dose) in light of age and risk factors, pt will get this if covered by her ins and affordable Given info on covid and paxlovid  and discussed possible side effects See AVS for additional symptom care recommendations   Reviewed isolation and masking recommendations and handout given   Update if not starting to improve in a week or if worsening  Call back and Er precautions noted in detail today  (any severe symptoms or shortness of breath will go to ER)       Relevant Medications   nirmatrelvir /ritonavir , renal dosing, (PAXLOVID ) 10 x 150 MG & 10 x 100MG  TABS   Acute cough   Neg flu tests Positive covid test See a/p for covid 19       Relevant Orders   POCT Influenza A/B (Completed)   POC COVID-19 (Completed)

## 2023-01-24 NOTE — Assessment & Plan Note (Signed)
 Neg flu tests Positive covid test See a/p for covid 19

## 2023-01-24 NOTE — Patient Instructions (Addendum)
 Rest  Isolate yourself until you feel better  When better - keep wearing mask for another 10 days  Drink fluids and rest  mucinex  DM is good for cough and congestion  Nasal saline for congestion as needed  Tylenol  for fever or pain or headache  Please alert us  if symptoms worsen (if severe or short of breath please go to the ER)    Keep using your inhaler  Take prednisone  as directed for wheezing   Update if not starting to improve in a week or if worsening    See if the paxlovid  is covered  If affordable-go ahead and start it

## 2023-03-02 ENCOUNTER — Ambulatory Visit (INDEPENDENT_AMBULATORY_CARE_PROVIDER_SITE_OTHER): Payer: Medicare HMO | Admitting: Nurse Practitioner

## 2023-03-02 VITALS — BP 120/72 | HR 69 | Temp 98.1°F | Ht 61.0 in | Wt 169.6 lb

## 2023-03-02 DIAGNOSIS — R10A2 Flank pain, left side: Secondary | ICD-10-CM | POA: Insufficient documentation

## 2023-03-02 DIAGNOSIS — R109 Unspecified abdominal pain: Secondary | ICD-10-CM | POA: Diagnosis not present

## 2023-03-02 DIAGNOSIS — I10 Essential (primary) hypertension: Secondary | ICD-10-CM

## 2023-03-02 NOTE — Progress Notes (Signed)
Established Patient Office Visit  Subjective   Patient ID: Rachel Quinn, female    DOB: Oct 05, 1945  Age: 78 y.o. MRN: [id number]  Chief Complaint  Patient presents with   Blood Pressure Follow-up    HPI  HTN: patient is currently maintained atenolol-chlorthalidone along with of potassium. She is not having musch dizziness. If she leans forwards and then stands up she will feel dizzy.  This is infrequent  Left side pain: state that she is working around [employer] or cleaning. She will get an ahce where her kidney. States that sheshe did have a kidney ache on the left and had a nephrectomy    Review of Systems  Constitutional:  Negative for chills and fever.  Respiratory:  Negative for shortness of breath.   Cardiovascular:  Negative for chest pain.  Gastrointestinal:        Bm daily   Neurological:  Negative for dizziness and headaches.      Objective:     BP 120/72   Pulse 69   Temp 98.1 F (36.7 C) (Oral)   Ht 5\' 1"  (1.549 m)   Wt 169 lb 9.6 oz (76.9 kg)   SpO2 98%   BMI 32.05 kg/m  BP Readings from Last 3 Encounters:  03/02/23 120/72  01/24/23 130/82  12/29/22 132/78   Wt Readings from Last 3 Encounters:  03/02/23 169 lb 9.6 oz (76.9 kg)  01/24/23 171 lb 4 oz (77.7 kg)  12/29/22 165 lb 3.2 oz (74.9 kg)   SpO2 Readings from Last 3 Encounters:  03/02/23 98%  01/24/23 97%  12/29/22 98%      Physical Exam Vitals and nursing note reviewed. Exam conducted with a chaperone present.  Constitutional:      Appearance: Normal appearance.  Cardiovascular:     Rate and Rhythm: Normal rate and regular rhythm.     Heart sounds: Normal heart sounds.  Pulmonary:     Effort: Pulmonary effort is normal.     Breath sounds: Normal breath sounds.  Abdominal:     Tenderness: There is no left CVA tenderness.  Musculoskeletal:        General: No tenderness.     Lumbar back: No tenderness or bony tenderness. Negative right straight leg raise  test and negative left straight leg raise test.     Comments: Lumbar ROM with out pain or limitation   Neurological:     Mental Status: She is alert.     Deep Tendon Reflexes:     Reflex Scores:      Patellar reflexes are 2+ on the right side and 2+ on the left side.    Comments: Bilateral lower extremity strength 5/5      No results found for any visits on 03/02/23.    The ASCVD Risk score (Arnett DK, et al., 2019) failed to calculate for the following reasons:   The valid total cholesterol range is 130 to 320 mg/dL    Assessment & Plan:   Problem List Items Addressed This Visit       Cardiovascular and Mediastinum   Hypertension - Primary   Patient currently maintained on atenolol-chlorthalidone.  Along with potassium replacement pending BMP today.  Continue medication as prescribed.  Patient's blood pressure under good control      Relevant Orders   Basic metabolic panel     Other   Left flank pain   Ambiguous in nature.  Worse with activity.  Do think this is musculoskeletal  in nature.  If it persists and continues continue ultrasound of the renal structures as patient is worried she may need a another kidney surgery as she had a "kidney ache" with her left one       Return in about 6 months (around 08/30/2023) for CPE and Labs.    [name], NP

## 2023-03-02 NOTE — Assessment & Plan Note (Signed)
Patient currently maintained on atenolol-chlorthalidone.  Along with potassium replacement pending BMP today.  Continue medication as prescribed.  Patient's blood pressure under good control

## 2023-03-02 NOTE — Patient Instructions (Signed)
Nice to see you today I will be in touch with the labs once I have it Follow up with me I 6 months for your physical and labs If you back does not improve let me know and we will get an ultrasound of the kidney

## 2023-03-02 NOTE — Assessment & Plan Note (Signed)
Ambiguous in nature.  Worse with activity.  Do think this is musculoskeletal in nature.  If it persists and continues continue ultrasound of the renal structures as patient is worried she may need a another kidney surgery as she had a "kidney ache" with her left one

## 2023-03-03 LAB — BASIC METABOLIC PANEL
BUN: 16 mg/dL (ref 6–23)
CO2: 31 meq/L (ref 19–32)
Calcium: 8.5 mg/dL (ref 8.4–10.5)
Chloride: 97 meq/L (ref 96–112)
Creatinine, Ser: 0.84 mg/dL (ref 0.40–1.20)
GFR: 67.59 mL/min (ref >60.00)
Glucose, Bld: 94 mg/dL (ref 70–99)
Potassium: 3.1 meq/L — ABNORMAL LOW (ref 3.5–5.1)
Sodium: 136 meq/L (ref 135–145)

## 2023-03-07 ENCOUNTER — Other Ambulatory Visit: Payer: Self-pay | Admitting: Nurse Practitioner

## 2023-03-07 ENCOUNTER — Encounter: Payer: Self-pay | Admitting: Nurse Practitioner

## 2023-03-07 DIAGNOSIS — E876 Hypokalemia: Secondary | ICD-10-CM

## 2023-03-07 MED ORDER — POTASSIUM CITRATE ER 10 MEQ (1080 MG) PO TBCR: 10.0000 meq | EXTENDED_RELEASE_TABLET | Freq: Every day | ORAL | 0 refills | Status: AC

## 2023-03-29 ENCOUNTER — Other Ambulatory Visit: Payer: Self-pay | Admitting: Nurse Practitioner

## 2023-03-29 DIAGNOSIS — E782 Mixed hyperlipidemia: Secondary | ICD-10-CM

## 2023-04-03 ENCOUNTER — Other Ambulatory Visit: Payer: Self-pay | Admitting: Nurse Practitioner

## 2023-04-03 DIAGNOSIS — I10 Essential (primary) hypertension: Secondary | ICD-10-CM

## 2023-04-03 DIAGNOSIS — E876 Hypokalemia: Secondary | ICD-10-CM

## 2023-04-03 MED ORDER — POTASSIUM CITRATE ER 10 MEQ (1080 MG) PO TBCR: 10.0000 meq | EXTENDED_RELEASE_TABLET | Freq: Every day | ORAL | 1 refills | Status: DC

## 2023-04-03 NOTE — Telephone Encounter (Signed)
 Reason for CRM: Patient daughter called regarding missed call from [place]. Called [facility] and advised she is busy and to send a crm. - Patient daughter Ivor Messier said she will be free at 4pm.

## 2023-04-03 NOTE — Telephone Encounter (Signed)
 Copied from CRM [id number]. Topic: Clinical - Medication Question >> Apr 03, 2023  8:30 AM Gurney Maxin H wrote: Reason for CRM: Patients daughter was inquiring why was only 3 pill of the potassium citrate (UROCIT-K) 10 MEQ (1080 MG) SR tablet was sent to the pharmacy instead of a full supply, agent advised daughter that refill request can be submitted to the provider which was done but daughter wants to know why the 3 pills were sent instead of a 90 day supply originally.  Ivor Messier [phone]

## 2023-04-03 NOTE — Telephone Encounter (Signed)
 Copied from CRM [id number]. Topic: Clinical - Medication Refill >> Apr 03, 2023  8:18 AM Marica Otter wrote: Most Recent Primary Care Visit:  Provider: Eden Emms  Department: [facility]  Visit Type: OFFICE VISIT  Date: 03/02/2023  Medication: potassium citrate (UROCIT-K) 10 MEQ (1080 MG) SR tablet  Has the patient contacted their pharmacy? Yes, 3 pills were called in (Agent: If no, request that the patient contact the pharmacy for the refill. If patient does not wish to contact the pharmacy document the reason why and proceed with request.) (Agent: If yes, when and what did the pharmacy advise?)  Is this the correct pharmacy for this prescription? Yes If no, delete pharmacy and type the correct one.  This is the patient's preferred pharmacy:  [facility] [address] ([address]), [place] - [address] [place] ([place]) [place] [address] Phone: [phone] Fax: [phone]   Has the prescription been filled recently? No  Is the patient out of the medication? Yes  Has the patient been seen for an appointment in the last year OR does the patient have an upcoming appointment? Yes  Can we respond through MyChart? Yes  Agent: Please be advised that Rx refills may take up to 3 business days. We ask that you follow-up with your pharmacy.

## 2023-04-03 NOTE — Telephone Encounter (Signed)
 Unable to reach patient. Left voicemail to return call to our office.

## 2023-04-03 NOTE — Telephone Encounter (Signed)
 Completed.

## 2023-04-03 NOTE — Telephone Encounter (Signed)
 The 3 pill script was an add on to the daily ones that she takes because when I checked blood it was low   Potassium was refilled

## 2023-04-06 ENCOUNTER — Other Ambulatory Visit: Payer: Self-pay | Admitting: Nurse Practitioner

## 2023-04-06 DIAGNOSIS — I10 Essential (primary) hypertension: Secondary | ICD-10-CM

## 2023-07-10 ENCOUNTER — Ambulatory Visit
Admission: EM | Admit: 2023-07-10 | Discharge: 2023-07-10 | Disposition: A | Discharge status: Home / Self Care | Attending: Family Medicine | Admitting: Family Medicine

## 2023-07-10 ENCOUNTER — Other Ambulatory Visit: Payer: Self-pay | Admitting: Nurse Practitioner

## 2023-07-10 DIAGNOSIS — L239 Allergic contact dermatitis, unspecified cause: Secondary | ICD-10-CM | POA: Diagnosis not present

## 2023-07-10 DIAGNOSIS — E782 Mixed hyperlipidemia: Secondary | ICD-10-CM

## 2023-07-10 DIAGNOSIS — L237 Allergic contact dermatitis due to plants, except food: Secondary | ICD-10-CM

## 2023-07-10 DIAGNOSIS — E890 Postprocedural hypothyroidism: Secondary | ICD-10-CM

## 2023-07-10 MED ORDER — TRIAMCINOLONE ACETONIDE 40 MG/ML IJ SUSP
40.0000 mg | Freq: Once | INTRAMUSCULAR | Status: AC
  Administered 2023-07-10: 40 mg via INTRAMUSCULAR

## 2023-07-10 MED ORDER — PREDNISONE 20 MG PO TABS: 40.0000 mg | ORAL_TABLET | Freq: Every day | ORAL | 0 refills | Status: AC

## 2023-07-10 NOTE — Telephone Encounter (Signed)
 Can we get patient scheduled for CPE for 09/01/2023 or a little after
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
## 2023-07-10 NOTE — ED Triage Notes (Signed)
 Pt reports she has a rash all over her body after doing yard work x 1 week   Applied cream but no relief

## 2023-07-10 NOTE — Discharge Instructions (Signed)
 We have given you an injection of triamcinolone  40 mg  Take prednisone  20 mg--2 daily for 5 days  Please take Claritin/loratadine or Allegra/fexofenadine daily as needed for itching.  These are nonsedating antihistamines.

## 2023-08-04 ENCOUNTER — Encounter: Payer: Self-pay | Admitting: Advanced Practice Midwife

## 2023-09-01 ENCOUNTER — Ambulatory Visit (INDEPENDENT_AMBULATORY_CARE_PROVIDER_SITE_OTHER): Admitting: Nurse Practitioner

## 2023-09-01 ENCOUNTER — Encounter: Payer: Self-pay | Admitting: Nurse Practitioner

## 2023-09-01 ENCOUNTER — Other Ambulatory Visit: Payer: Self-pay | Admitting: Family

## 2023-09-01 ENCOUNTER — Telehealth: Payer: Self-pay | Admitting: *Deleted

## 2023-09-01 ENCOUNTER — Ambulatory Visit: Payer: Self-pay | Admitting: Family

## 2023-09-01 VITALS — BP 122/70 | HR 55 | Temp 97.8°F | Ht 60.0 in | Wt 160.2 lb

## 2023-09-01 DIAGNOSIS — I10 Essential (primary) hypertension: Secondary | ICD-10-CM | POA: Diagnosis not present

## 2023-09-01 DIAGNOSIS — Z0000 Encounter for general adult medical examination without abnormal findings: Secondary | ICD-10-CM

## 2023-09-01 DIAGNOSIS — Z23 Encounter for immunization: Secondary | ICD-10-CM

## 2023-09-01 DIAGNOSIS — E890 Postprocedural hypothyroidism: Secondary | ICD-10-CM | POA: Diagnosis not present

## 2023-09-01 DIAGNOSIS — E782 Mixed hyperlipidemia: Secondary | ICD-10-CM

## 2023-09-01 DIAGNOSIS — M8580 Other specified disorders of bone density and structure, unspecified site: Secondary | ICD-10-CM | POA: Insufficient documentation

## 2023-09-01 DIAGNOSIS — Z905 Acquired absence of kidney: Secondary | ICD-10-CM | POA: Diagnosis not present

## 2023-09-01 LAB — COMPREHENSIVE METABOLIC PANEL WITH GFR
ALT: 24 U/L (ref 0–35)
AST: 22 U/L (ref 0–37)
Albumin: 4.1 g/dL (ref 3.5–5.2)
Alkaline Phosphatase: 67 U/L (ref 39–117)
BUN: 20 mg/dL (ref 6–23)
CO2: 33 meq/L — ABNORMAL HIGH (ref 19–32)
Calcium: 8.9 mg/dL (ref 8.4–10.5)
Chloride: 99 meq/L (ref 96–112)
Creatinine, Ser: 0.8 mg/dL (ref 0.40–1.20)
GFR: 71.41 mL/min (ref >60.00)
Glucose, Bld: 90 mg/dL (ref 70–99)
Potassium: 3.9 meq/L (ref 3.5–5.1)
Sodium: 140 meq/L (ref 135–145)
Total Bilirubin: 1.6 mg/dL — ABNORMAL HIGH (ref 0.2–1.2)
Total Protein: 7.1 g/dL (ref 6.0–8.3)

## 2023-09-01 LAB — CBC WITH DIFFERENTIAL/PLATELET
Basophils Absolute: 0 K/uL (ref 0.0–0.1)
Basophils Relative: 0.6 % (ref 0.0–3.0)
Eosinophils Absolute: 0.1 K/uL (ref 0.0–0.7)
Eosinophils Relative: 1.2 % (ref 0.0–5.0)
HCT: 41.4 % (ref 36.0–46.0)
Hemoglobin: 13.9 g/dL (ref 12.0–15.0)
Lymphocytes Relative: 21.6 % (ref 12.0–46.0)
Lymphs Abs: 1.6 K/uL (ref 0.7–4.0)
MCHC: 33.5 g/dL (ref 30.0–36.0)
MCV: 92 fl (ref 78.0–100.0)
Monocytes Absolute: 0.6 K/uL (ref 0.1–1.0)
Monocytes Relative: 7.5 % (ref 3.0–12.0)
Neutro Abs: 5.1 K/uL (ref 1.4–7.7)
Neutrophils Relative %: 69.1 % (ref 43.0–77.0)
Platelets: 170 K/uL (ref 150.0–400.0)
RBC: 4.5 Mil/uL (ref 3.87–5.11)
RDW: 14.5 % (ref 11.5–15.5)
WBC: 7.4 K/uL (ref 4.0–10.5)

## 2023-09-01 LAB — LIPID PANEL
Cholesterol: 137 mg/dL (ref 0–200)
HDL: 54.8 mg/dL (ref >39.00)
LDL Cholesterol: 66 mg/dL (ref 0–99)
NonHDL: 82.23
Total CHOL/HDL Ratio: 3
Triglycerides: 79 mg/dL (ref 0.0–149.0)
VLDL: 15.8 mg/dL (ref 0.0–40.0)

## 2023-09-01 LAB — VITAMIN D 25 HYDROXY (VIT D DEFICIENCY, FRACTURES): VITD: 102.38 ng/mL (ref 30.00–100.00)

## 2023-09-01 LAB — TSH: TSH: 0.26 u[IU]/mL — ABNORMAL LOW (ref 0.35–5.50)

## 2023-09-01 MED ORDER — LEVOTHYROXINE SODIUM 75 MCG PO TABS: 75.0000 ug | ORAL_TABLET | Freq: Every day | ORAL | 3 refills | Status: DC

## 2023-09-01 NOTE — Assessment & Plan Note (Signed)
 History of same patient declines DEXA scan

## 2023-09-01 NOTE — Telephone Encounter (Signed)
 Called pt and spoke with Ulla, pts daughter [place].  Gave advice per pcp. Ulla said that she will let the patient know and call back on Monday with the strength and confirm if she is taking vitamin d  or not. Ulla says she thinks her mother is but will call on Monday.

## 2023-09-01 NOTE — Assessment & Plan Note (Signed)
 Currently supplemented on levothyroxine  88 mcg daily.  Pending TSH

## 2023-09-01 NOTE — Assessment & Plan Note (Signed)
 Patient currently maintained on atorvastatin  10 mg daily.  Pending lipid panel today

## 2023-09-01 NOTE — Assessment & Plan Note (Signed)
 Patient renal function today

## 2023-09-01 NOTE — Assessment & Plan Note (Signed)
 Discussed age-appropriate immunizations and screening exams.  Did review patient's personal, surgical, social, family histories.  Patient is up-to-date with all age-appropriate vaccinations she would like.  Administer Prevnar 20 today.  Patient get shingles vaccine at local pharmacy.  Patient declines CRC screening, breast cancer screening and has aged out of cervical cancer screening.  Patient was given information at discharge about preventative healthcare maintenance with anticipatory guidance.

## 2023-09-01 NOTE — Telephone Encounter (Signed)
 Hope with [employer] with Critical labs.  Pt's Vitamin D  is critically high at 102.38  Results given to PCP

## 2023-09-01 NOTE — Assessment & Plan Note (Signed)
 Patient currently maintained on atenolol -chlorthalidone  50-25 mg daily.  Blood pressure controlled.  Patient tolerating medication well.  Continue medication as prescribed

## 2023-09-01 NOTE — Progress Notes (Signed)
 Established Patient Office Visit  Subjective   Patient ID: Rachel Quinn, female    DOB: Feb 28, 1945  Age: 78 y.o. MRN: [id number]  Chief Complaint  Patient presents with   Annual Exam    HPI  Hypothyroidism: Patient currently maintained on levothyroxine  88 mcg daily  HTN: Patient currently maintained on atenolol -chlorthalidone  50-25 mg daily.  Patient is also maintained on potassium citrate  10 mEq 1 daily for diuretic induced hypokalemia. She does not check it at home. States no light headed or dizzines   HLD: Patient currently maintained on atorvastatin  10 mg daily  for complete physical and follow up of chronic conditions.  Immunizations: -Tetanus: Completed in 2024 -Influenza: Out of season -Shingles: Did not local pharmacy -Pneumonia: Needs Prevnar 20.  Update today  Diet: Fair diet. She will eat 3 meals a day. She will have a snack. She does coffee and ensure. She drinks lots of water  Exercise: No regular exercise. 10 mins in the yard most days. House work   Eye exam: PRN glasses when she goes   Dental exam: dentures   Colonoscopy: Completed in 11/18/2020, repeat 5 years patient will be due in 2027 Lung Cancer Screening: N/A  Pap smear: Aged out  Mammogram:refused   DEXA:02/11/2021 with osteopenia.  Patient is due refuses   Sleep: does to bed around 12-1 and she gets up at 8. States that she feels rested       Review of Systems  Constitutional:  Negative for chills and fever.  Respiratory:  Negative for shortness of breath.   Cardiovascular:  Negative for chest pain and leg swelling.  Gastrointestinal:  Negative for abdominal pain, blood in stool, constipation, diarrhea, nausea and vomiting.       BM dialy to every other day   Genitourinary:  Negative for dysuria and hematuria.  Neurological:  Negative for tingling and headaches.  Psychiatric/Behavioral:  Negative for hallucinations and suicidal ideas.       Objective:     BP 122/70    Pulse (!) 55   Temp 97.8 F (36.6 C) (Oral)   Ht 5' (1.524 m)   Wt 160 lb 3.2 oz (72.7 kg)   SpO2 98%   BMI 31.29 kg/m  BP Readings from Last 3 Encounters:  09/01/23 122/70  07/10/23 (!) 152/87  03/02/23 120/72   Wt Readings from Last 3 Encounters:  09/01/23 160 lb 3.2 oz (72.7 kg)  03/02/23 169 lb 9.6 oz (76.9 kg)  01/24/23 171 lb 4 oz (77.7 kg)   SpO2 Readings from Last 3 Encounters:  09/01/23 98%  07/10/23 97%  03/02/23 98%      Physical Exam Vitals and nursing note reviewed.  Constitutional:      Appearance: Normal appearance.  HENT:     Right Ear: Tympanic membrane, ear canal and external ear normal.     Left Ear: Tympanic membrane, ear canal and external ear normal.     Mouth/Throat:     Mouth: Mucous membranes are moist.     Pharynx: Oropharynx is clear.  Eyes:     Extraocular Movements: Extraocular movements intact.     Pupils: Pupils are equal, round, and reactive to light.     Comments: Arcus senilis   Cardiovascular:     Rate and Rhythm: Normal rate and regular rhythm.     Pulses: Normal pulses.     Heart sounds: Normal heart sounds.  Pulmonary:     Effort: Pulmonary effort is normal.     Breath  sounds: Normal breath sounds.  Abdominal:     General: Bowel sounds are normal. There is no distension.     Palpations: There is no mass.     Tenderness: There is no abdominal tenderness.     Hernia: No hernia is present.  Musculoskeletal:     Right lower leg: No edema.     Left lower leg: No edema.  Lymphadenopathy:     Cervical: No cervical adenopathy.  Skin:    General: Skin is warm.  Neurological:     General: No focal deficit present.     Deep Tendon Reflexes:     Reflex Scores:      Bicep reflexes are 2+ on the right side and 2+ on the left side.      Patellar reflexes are 2+ on the right side and 2+ on the left side.    Comments: Bilateral upper and lower extremity strength 5/5  Psychiatric:        Mood and Affect: Mood normal.         Behavior: Behavior normal.        Thought Content: Thought content normal.        Judgment: Judgment normal.      No results found for any visits on 09/01/23.    The ASCVD Risk score (Arnett DK, et al., 2019) failed to calculate for the following reasons:   The valid total cholesterol range is 130 to 320 mg/dL    Assessment & Plan:   Problem List Items Addressed This Visit       Cardiovascular and Mediastinum   Hypertension   Patient currently maintained on atenolol -chlorthalidone  50-25 mg daily.  Blood pressure controlled.  Patient tolerating medication well.  Continue medication as prescribed      Relevant Orders   CBC with Differential/Platelet   Comprehensive metabolic panel with GFR   TSH     Endocrine   Hypothyroidism, postsurgical   Currently supplemented on levothyroxine  88 mcg daily.  Pending TSH      Relevant Orders   TSH     Musculoskeletal and Integument   Osteopenia   History of same patient declines DEXA scan      Relevant Orders   VITAMIN D  25 Hydroxy (Vit-D Deficiency, Fractures)     Other   S/p nephrectomy   Patient renal function today      Mixed hyperlipidemia   Patient currently maintained on atorvastatin  10 mg daily.  Pending lipid panel today      Relevant Orders   Lipid panel   Preventative health care - Primary   Discussed age-appropriate immunizations and screening exams.  Did review patient's personal, surgical, social, family histories.  Patient is up-to-date with all age-appropriate vaccinations she would like.  Administer Prevnar 20 today.  Patient get shingles vaccine at local pharmacy.  Patient declines CRC screening, breast cancer screening and has aged out of cervical cancer screening.  Patient was given information at discharge about preventative healthcare maintenance with anticipatory guidance.      Other Visit Diagnoses       Need for pneumococcal 20-valent conjugate vaccination       Relevant Orders   Pneumococcal  conjugate vaccine 20-valent (Prevnar 20) (Completed)       Return in about 6 months (around 03/03/2024) for BP recheck.    Adina Crandall, NP

## 2023-09-01 NOTE — Telephone Encounter (Signed)
 Can we call and see if the patient is taking any over the counter vitmain D supplement. If she is tell her to stop and let me know the strength and I will give recommendations.

## 2023-09-01 NOTE — Patient Instructions (Signed)
 Nice to see you today  I will be in touch with the labs once I have them  Follow up with me in 6 months, sooner if you need me  You can take the levothyroxine  first thing in the morning with only water. Wait then take your atenolol -chlorthalidone .  You can take all the other medicatons at bed time

## 2023-09-04 ENCOUNTER — Telehealth: Payer: Self-pay

## 2023-09-04 ENCOUNTER — Ambulatory Visit: Payer: Self-pay

## 2023-09-04 ENCOUNTER — Encounter: Payer: Self-pay | Admitting: Internal Medicine

## 2023-09-04 ENCOUNTER — Ambulatory Visit: Admitting: Internal Medicine

## 2023-09-04 VITALS — BP 122/70 | HR 60 | Temp 97.9°F | Ht 60.0 in | Wt 161.2 lb

## 2023-09-04 DIAGNOSIS — T50Z95A Adverse effect of other vaccines and biological substances, initial encounter: Secondary | ICD-10-CM | POA: Diagnosis not present

## 2023-09-04 DIAGNOSIS — T50A95A Adverse effect of other bacterial vaccines, initial encounter: Secondary | ICD-10-CM

## 2023-09-04 NOTE — Assessment & Plan Note (Signed)
 No infection Discussed using cool compresses, tylenol , loratadine for the itching Reassured--this should be self resolving

## 2023-09-04 NOTE — Telephone Encounter (Signed)
 Called and spoke to pt's daughter. They will come today. There should be someone available to check her in if I am not back in time.

## 2023-09-04 NOTE — Progress Notes (Signed)
 Subjective:    Patient ID: Rachel Quinn, female    DOB: July 25, 1945, 78 y.o.   MRN: [id number]  HPI Here due to a vaccine reaction Got prevnar 20 three days ago  That night it got painful and red Now is improving or stable for the past 2 days Still painful  Hasn't tried any medication No ice or heat  Some itching also  Current Outpatient Medications on File Prior to Visit  Medication Sig Dispense Refill   albuterol  (VENTOLIN  HFA) 108 (90 Base) MCG/ACT inhaler Inhale 2 puffs into the lungs every 6 (six) hours as needed for wheezing or shortness of breath (Cough). 18 g 0   atenolol -chlorthalidone  (TENORETIC ) 50-25 MG tablet Take 1 tablet by mouth once daily 90 tablet 1   atorvastatin  (LIPITOR) 10 MG tablet Take 1 tablet by mouth once daily 90 tablet 0   calcium  carbonate (OS-CAL - DOSED IN MG OF ELEMENTAL CALCIUM ) 1250 (500 Ca) MG tablet Take 1 tablet by mouth.     levothyroxine  (SYNTHROID ) 75 MCG tablet Take 1 tablet (75 mcg total) by mouth daily. 30 tablet 3   potassium citrate  (UROCIT-K ) 10 MEQ (1080 MG) SR tablet Take 1 tablet (10 mEq total) by mouth daily. 90 tablet 1   No current facility-administered medications on file prior to visit.    No Known Allergies  Past Medical History:  Diagnosis Date   Cataract    Chronic kidney disease    Heart murmur    Hypertension    Kidney stone    Thyroid  disease     Past Surgical History:  Procedure Laterality Date   CATARACT EXTRACTION Bilateral 01/2020   KIDNEY SURGERY  2000   NEPHRECTOMY Left 01/18/1999   2/2 nephrolithiasis   THYROIDECTOMY  01/17/2002   TUBAL LIGATION      Family History  Problem Relation Age of Onset   Healthy Mother    Healthy Father    Cancer Neg Hx    Heart attack Neg Hx    Stroke Neg Hx    Colon cancer Neg Hx    Esophageal cancer Neg Hx    Rectal cancer Neg Hx    Stomach cancer Neg Hx     Social History   Socioeconomic History   Marital status: Married    Spouse name: Not  on file   Number of children: 9   Years of education: college   Highest education level: Not on file  Occupational History   Not on file  Tobacco Use   Smoking status: Never   Smokeless tobacco: Never  Vaping Use   Vaping status: Never Used  Substance and Sexual Activity   Alcohol use: No    Alcohol/week: 0.0 standard drinks of alcohol   Drug use: No   Sexual activity: Not Currently  Other Topics Concern   Not on file  Social History Narrative   Lives with son Elene) and his family   From [place] ago   Enjoys: spending time with family, church Sunday, 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   Exercise: taking care of her granddaughter (73 years old), walking daily   Diet: rice, fish, chicken, veggies, fruit   Social Drivers of Health   Financial Resource Strain: Low Risk  (09/21/2022)   Overall Financial Resource Strain (CARDIA)    Difficulty of Paying Living Expenses: Not hard at all  Food Insecurity: No Food Insecurity (09/21/2022)   Hunger Vital Sign    Worried  About Running Out of Food in the Last Year: Never true    Ran Out of Food in the Last Year: Never true  Transportation Needs: No Transportation Needs (09/21/2022)   PRAPARE - Administrator, Civil Service (Medical): No    Lack of Transportation (Non-Medical): No  Physical Activity: Sufficiently Active (09/21/2022)   Exercise Vital Sign    Days of Exercise per Week: 6 days    Minutes of Exercise per Session: 30 min  Stress: No Stress Concern Present (09/21/2022)   [employer] of Occupational Health - Occupational Stress Questionnaire    Feeling of Stress : Not at all  Social Connections: Moderately Integrated (09/21/2022)   Social Connection and Isolation Panel    Frequency of Communication with Friends and Family: More than three times a week    Frequency of Social Gatherings with Friends and Family: More than three times a week    Attends Religious Services: More than 4 times  per year    Active Member of [employer] or Organizations: No    Attends Banker Meetings: Never    Marital Status: Married  Catering manager Violence: Not At Risk (09/21/2022)   Humiliation, Afraid, Rape, and Kick questionnaire    Fear of Current or Ex-Partner: No    Emotionally Abused: No    Physically Abused: No    Sexually Abused: No   Review of Systems Fever at first--but not now No other rash No N/V    Objective:   Physical Exam Skin:    Comments: ~ 8 x 8 cm red area along left deltoid around over biceps area No fluctuance or induration Mild tenderness            Assessment & Plan:

## 2023-09-04 NOTE — Telephone Encounter (Signed)
 FYI Only or Action Required?: FYI only for provider.  Patient was last seen in primary care on 09/01/2023 by Wendee Lynwood HERO, NP.  Called Nurse Triage reporting Immunizations, Fever, and Fatigue.  Symptoms began several days ago.  Interventions attempted: Nothing.  Symptoms are: injection site tenderness, redness, warmth, swelling; feverish; fatigue unchanged.  Triage Disposition: See PCP When Office is Open (Within 3 Days)  Patient/caregiver understands and will follow disposition?: Yes          Copied from CRM #[id number]. Topic: Clinical - Red Word Triage >> Sep 04, 2023 10:10 AM Drema MATSU wrote: [facility] that prompted transfer to Nurse Triage: Patient had a pnemonia shot on Friday and where she had the shot it is red, whelping up (size of palm), fever, and very fatigued. Reason for Disposition  [1] Pain, tenderness, or swelling at the injection site AND [2] persists > 3 days  Answer Assessment - Initial Assessment Questions 1. SYMPTOMS: What is the main symptom? (e.g., pain, redness, or swelling at injection site; feeling tired, fever, muscle aches)      Redness, swelling and warmth at injection site (about the size of the palm of her hand; about half the size of her upper arm), fever, fatigue.  2. ONSET: When was the vaccine (shot) given? How much later did the symptoms begin? (e.g., hours, days ago)      Vaccine was given Friday 09/01/23, daughter states she thinks symptoms began that night.  3. SEVERITY: How bad is it?      Daughter is unsure she is not with the patient. So she does not know if it has gotten better, worse or same today. She states the fatigue was not severe, patient was able to get out of bed.  4. FEVER: Do you have a fever? If Yes, ask: What is your temperature, how was it measured, and when did it start?      Daughter states she did not have a thermometer so she was unable to check.  5. IMMUNIZATIONS GIVEN: What shots have you recently  received?     Prevnar 20.  6. PAST REACTIONS: Have you reacted to immunizations before? If Yes, ask: What happened?     Daughter states when the patient gets the flu shot she will feel somewhat fatigued but nothing like this.  7. OTHER SYMPTOMS: Do you have any other symptoms?     No. Denies any difficulty breathing  8. PREGNANCY: Is there any chance you are pregnant? When was your last menstrual period?     N/A.  Protocols used: Immunization Reactions-A-AH

## 2023-09-04 NOTE — Patient Instructions (Signed)
 Please try tylenol  2 tabs (either 325 or 500mg ) three times a day Use cool compresses on the area Try loratadine 10mg  once or twice a day for the itching

## 2023-09-04 NOTE — Telephone Encounter (Signed)
 Copied from CRM #[id number]. Topic: Clinical - Medication Question >> Sep 04, 2023 10:07 AM Drema MATSU wrote: Reason for CRM: Patient daughter state that mom is taking Vitamin D  125 MCG 5000IU once time a day.

## 2023-09-05 ENCOUNTER — Ambulatory Visit: Admitting: Internal Medicine

## 2023-09-09 ENCOUNTER — Emergency Department

## 2023-09-09 ENCOUNTER — Ambulatory Visit
Admission: EM | Admit: 2023-09-09 | Discharge: 2023-09-09 | Attending: Emergency Medicine | Admitting: Emergency Medicine

## 2023-09-09 ENCOUNTER — Other Ambulatory Visit: Payer: Self-pay

## 2023-09-09 ENCOUNTER — Inpatient Hospital Stay
Admission: EM | Admit: 2023-09-09 | Discharge: 2023-09-12 | DRG: 485 | Disposition: A | Discharge status: Home Health | Attending: Internal Medicine | Admitting: Internal Medicine

## 2023-09-09 DIAGNOSIS — M25662 Stiffness of left knee, not elsewhere classified: Secondary | ICD-10-CM | POA: Diagnosis not present

## 2023-09-09 DIAGNOSIS — M65962 Unspecified synovitis and tenosynovitis, left lower leg: Secondary | ICD-10-CM | POA: Diagnosis present

## 2023-09-09 DIAGNOSIS — R062 Wheezing: Secondary | ICD-10-CM | POA: Diagnosis not present

## 2023-09-09 DIAGNOSIS — E876 Hypokalemia: Secondary | ICD-10-CM | POA: Diagnosis not present

## 2023-09-09 DIAGNOSIS — E785 Hyperlipidemia, unspecified: Secondary | ICD-10-CM | POA: Insufficient documentation

## 2023-09-09 DIAGNOSIS — E878 Other disorders of electrolyte and fluid balance, not elsewhere classified: Secondary | ICD-10-CM | POA: Diagnosis present

## 2023-09-09 DIAGNOSIS — M25562 Pain in left knee: Principal | ICD-10-CM

## 2023-09-09 DIAGNOSIS — J9859 Other diseases of mediastinum, not elsewhere classified: Secondary | ICD-10-CM | POA: Diagnosis not present

## 2023-09-09 DIAGNOSIS — E871 Hypo-osmolality and hyponatremia: Secondary | ICD-10-CM | POA: Diagnosis not present

## 2023-09-09 DIAGNOSIS — Z79890 Hormone replacement therapy (postmenopausal): Secondary | ICD-10-CM | POA: Diagnosis not present

## 2023-09-09 DIAGNOSIS — M1712 Unilateral primary osteoarthritis, left knee: Secondary | ICD-10-CM | POA: Diagnosis present

## 2023-09-09 DIAGNOSIS — E890 Postprocedural hypothyroidism: Secondary | ICD-10-CM | POA: Diagnosis not present

## 2023-09-09 DIAGNOSIS — M25462 Effusion, left knee: Secondary | ICD-10-CM | POA: Diagnosis not present

## 2023-09-09 DIAGNOSIS — M00062 Staphylococcal arthritis, left knee: Secondary | ICD-10-CM | POA: Diagnosis not present

## 2023-09-09 DIAGNOSIS — M778 Other enthesopathies, not elsewhere classified: Secondary | ICD-10-CM | POA: Diagnosis not present

## 2023-09-09 DIAGNOSIS — I10 Essential (primary) hypertension: Secondary | ICD-10-CM | POA: Insufficient documentation

## 2023-09-09 DIAGNOSIS — D696 Thrombocytopenia, unspecified: Secondary | ICD-10-CM | POA: Diagnosis present

## 2023-09-09 DIAGNOSIS — Z905 Acquired absence of kidney: Secondary | ICD-10-CM | POA: Diagnosis not present

## 2023-09-09 DIAGNOSIS — E039 Hypothyroidism, unspecified: Secondary | ICD-10-CM | POA: Insufficient documentation

## 2023-09-09 DIAGNOSIS — M25532 Pain in left wrist: Secondary | ICD-10-CM | POA: Diagnosis not present

## 2023-09-09 DIAGNOSIS — U071 COVID-19: Secondary | ICD-10-CM

## 2023-09-09 DIAGNOSIS — I129 Hypertensive chronic kidney disease with stage 1 through stage 4 chronic kidney disease, or unspecified chronic kidney disease: Secondary | ICD-10-CM | POA: Diagnosis present

## 2023-09-09 DIAGNOSIS — M009 Pyogenic arthritis, unspecified: Principal | ICD-10-CM | POA: Diagnosis present

## 2023-09-09 DIAGNOSIS — M7989 Other specified soft tissue disorders: Secondary | ICD-10-CM | POA: Diagnosis not present

## 2023-09-09 DIAGNOSIS — M19032 Primary osteoarthritis, left wrist: Secondary | ICD-10-CM | POA: Diagnosis not present

## 2023-09-09 LAB — BASIC METABOLIC PANEL WITH GFR
Anion gap: 14 (ref 5–15)
BUN: 13 mg/dL (ref 8–23)
CO2: 22 mmol/L (ref 22–32)
Calcium: 7.9 mg/dL — ABNORMAL LOW (ref 8.9–10.3)
Chloride: 92 mmol/L — ABNORMAL LOW (ref 98–111)
Creatinine, Ser: 0.89 mg/dL (ref 0.44–1.00)
GFR, Estimated: >60 mL/min (ref >60)
Glucose, Bld: 124 mg/dL — ABNORMAL HIGH (ref 70–99)
Potassium: 3.1 mmol/L — ABNORMAL LOW (ref 3.5–5.1)
Sodium: 128 mmol/L — ABNORMAL LOW (ref 135–145)

## 2023-09-09 LAB — SYNOVIAL CELL COUNT + DIFF, W/ CRYSTALS
Crystals, Fluid: NONE SEEN
Eosinophils-Synovial: 0 %
Lymphocytes-Synovial Fld: 23 %
Monocyte-Macrophage-Synovial Fluid: 6 %
Neutrophil, Synovial: 71 %
WBC, Synovial: 75505 /mm3 — ABNORMAL HIGH (ref 0–200)

## 2023-09-09 LAB — CBC
HCT: 41.1 % (ref 36.0–46.0)
Hemoglobin: 13.5 g/dL (ref 12.0–15.0)
MCH: 30.7 pg (ref 26.0–34.0)
MCHC: 32.8 g/dL (ref 30.0–36.0)
MCV: 93.4 fL (ref 80.0–100.0)
Platelets: 104 K/uL — ABNORMAL LOW (ref 150–400)
RBC: 4.4 MIL/uL (ref 3.87–5.11)
RDW: 13.5 % (ref 11.5–15.5)
WBC: 11 K/uL — ABNORMAL HIGH (ref 4.0–10.5)
nRBC: 0 % (ref 0.0–0.2)

## 2023-09-09 LAB — URIC ACID: Uric Acid, Serum: 4.9 mg/dL (ref 2.5–7.1)

## 2023-09-09 LAB — RESP PANEL BY RT-PCR (RSV, FLU A&B, COVID)  RVPGX2
Influenza A by PCR: NEGATIVE
Influenza B by PCR: NEGATIVE
Resp Syncytial Virus by PCR: NEGATIVE
SARS Coronavirus 2 by RT PCR: POSITIVE — AB

## 2023-09-09 LAB — MAGNESIUM: Magnesium: 1.9 mg/dL (ref 1.7–2.4)

## 2023-09-09 MED ORDER — ATENOLOL-CHLORTHALIDONE 50-25 MG PO TABS: 1.0000 | ORAL_TABLET | Freq: Every day | ORAL | Status: DC

## 2023-09-09 MED ORDER — CHLORTHALIDONE 25 MG PO TABS
25.0000 mg | ORAL_TABLET | Freq: Every day | ORAL | Status: DC
  Administered 2023-09-10 – 2023-09-12 (×3): 25 mg via ORAL
  Filled 2023-09-09 (×3): qty 1

## 2023-09-09 MED ORDER — LEVOTHYROXINE SODIUM 50 MCG PO TABS
75.0000 ug | ORAL_TABLET | Freq: Every day | ORAL | Status: DC
  Administered 2023-09-10 – 2023-09-12 (×3): 75 ug via ORAL
  Filled 2023-09-09 (×2): qty 1
  Filled 2023-09-09: qty 2

## 2023-09-09 MED ORDER — ACETAMINOPHEN 650 MG RE SUPP
650.0000 mg | Freq: Four times a day (QID) | RECTAL | Status: DC | PRN
Start: 2023-09-09 — End: 2023-09-12

## 2023-09-09 MED ORDER — TRAZODONE HCL 50 MG PO TABS: 25.0000 mg | ORAL_TABLET | Freq: Every evening | ORAL | Status: DC | PRN

## 2023-09-09 MED ORDER — OXYCODONE-ACETAMINOPHEN 5-325 MG PO TABS
1.0000 | ORAL_TABLET | Freq: Once | ORAL | Status: AC
  Administered 2023-09-09: 1 via ORAL
  Filled 2023-09-09: qty 1

## 2023-09-09 MED ORDER — POTASSIUM CITRATE ER 10 MEQ (1080 MG) PO TBCR
10.0000 meq | EXTENDED_RELEASE_TABLET | Freq: Every day | ORAL | Status: DC
  Filled 2023-09-09: qty 1

## 2023-09-09 MED ORDER — MAGNESIUM HYDROXIDE 400 MG/5ML PO SUSP: 30.0000 mL | Freq: Every day | ORAL | Status: DC | PRN

## 2023-09-09 MED ORDER — ATORVASTATIN CALCIUM 10 MG PO TABS
10.0000 mg | ORAL_TABLET | Freq: Every day | ORAL | Status: DC
  Administered 2023-09-10 – 2023-09-12 (×3): 10 mg via ORAL
  Filled 2023-09-09 (×3): qty 1

## 2023-09-09 MED ORDER — IPRATROPIUM-ALBUTEROL 0.5-2.5 (3) MG/3ML IN SOLN
3.0000 mL | Freq: Once | RESPIRATORY_TRACT | Status: AC
  Administered 2023-09-09: 3 mL via RESPIRATORY_TRACT
  Filled 2023-09-09: qty 3

## 2023-09-09 MED ORDER — METHYLPREDNISOLONE SODIUM SUCC 125 MG IJ SOLR
60.0000 mg | Freq: Once | INTRAMUSCULAR | Status: AC
  Administered 2023-09-09: 60 mg via INTRAMUSCULAR

## 2023-09-09 MED ORDER — ONDANSETRON HCL 4 MG PO TABS: 4.0000 mg | ORAL_TABLET | Freq: Four times a day (QID) | ORAL | Status: DC | PRN

## 2023-09-09 MED ORDER — ATENOLOL 25 MG PO TABS
50.0000 mg | ORAL_TABLET | Freq: Every day | ORAL | Status: DC
  Administered 2023-09-10 – 2023-09-12 (×3): 50 mg via ORAL
  Filled 2023-09-09 (×3): qty 2

## 2023-09-09 MED ORDER — ONDANSETRON HCL 4 MG/2ML IJ SOLN
4.0000 mg | Freq: Four times a day (QID) | INTRAMUSCULAR | Status: DC | PRN
  Administered 2023-09-09: 4 mg via INTRAVENOUS
  Filled 2023-09-09: qty 2

## 2023-09-09 MED ORDER — CALCIUM CARBONATE 1250 (500 CA) MG PO TABS
1.0000 | ORAL_TABLET | Freq: Two times a day (BID) | ORAL | Status: DC
  Administered 2023-09-10 – 2023-09-12 (×5): 1250 mg via ORAL
  Filled 2023-09-09 (×7): qty 1

## 2023-09-09 MED ORDER — ENOXAPARIN SODIUM 40 MG/0.4ML IJ SOSY
40.0000 mg | PREFILLED_SYRINGE | INTRAMUSCULAR | Status: DC
  Administered 2023-09-09 – 2023-09-12 (×3): 40 mg via SUBCUTANEOUS
  Filled 2023-09-09 (×3): qty 0.4

## 2023-09-09 MED ORDER — LIDOCAINE HCL (PF) 1 % IJ SOLN
5.0000 mL | Freq: Once | INTRAMUSCULAR | Status: AC
  Administered 2023-09-09: 5 mL
  Filled 2023-09-09: qty 5

## 2023-09-09 MED ORDER — SODIUM CHLORIDE 0.9 % IV SOLN: INTRAVENOUS | Status: AC

## 2023-09-09 MED ORDER — SODIUM CHLORIDE 0.9 % IV BOLUS
1000.0000 mL | Freq: Once | INTRAVENOUS | Status: AC
  Administered 2023-09-09: 1000 mL via INTRAVENOUS

## 2023-09-09 MED ORDER — OXYCODONE HCL 5 MG PO TABS
5.0000 mg | ORAL_TABLET | Freq: Once | ORAL | Status: AC
  Administered 2023-09-09: 5 mg via ORAL
  Filled 2023-09-09: qty 1

## 2023-09-09 MED ORDER — ACETAMINOPHEN 325 MG PO TABS: 650.0000 mg | ORAL_TABLET | Freq: Four times a day (QID) | ORAL | Status: DC | PRN

## 2023-09-09 MED ORDER — POTASSIUM CHLORIDE CRYS ER 20 MEQ PO TBCR
40.0000 meq | EXTENDED_RELEASE_TABLET | Freq: Once | ORAL | Status: AC
  Administered 2023-09-09: 40 meq via ORAL
  Filled 2023-09-09: qty 2

## 2023-09-09 MED ORDER — ALBUTEROL SULFATE (2.5 MG/3ML) 0.083% IN NEBU: 3.0000 mL | INHALATION_SOLUTION | Freq: Four times a day (QID) | RESPIRATORY_TRACT | Status: DC | PRN
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
## 2023-09-09 NOTE — ED Provider Notes (Signed)
-----------------------------------------   4:49 PM on 09/09/2023 ----------------------------------------- I have personally seen and evaluated the patient in conjunction with [facility].  Patient has a swollen painful left knee.  Obvious joint effusion palpated on exam.  Patient does have arthritis on her x-ray but no fracture.  We are able to numb the knee I was able to aspirate approximately 30 cc of yellow fluid.  Will send down for synovial fluid testing.  ARTHOCENTESIS Performed by: Franky Moores Consent: Verbal consent obtained. Risks and benefits: risks, benefits and alternatives were discussed Consent given by: patient Required items: required blood products, implants, devices, and special equipment available Patient identity confirmed: verbally with patient Time out: Immediately prior to procedure a time out was called to verify the correct patient, procedure, equipment, support staff and site/side marked as required. Indications: Evaluate for septic joint Joint: Left knee Local anesthesia used: 1% Xylocaine  Preparation: Patient was prepped and draped in the usual sterile fashion. Aspirate appearance: Yellow Aspirate amount: 30 ml Patient tolerance: Patient tolerated the procedure well with no immediate complications.     Moores Franky, MD 09/09/23 1651

## 2023-09-09 NOTE — ED Triage Notes (Signed)
 Pt to ED for left knee swelling x3 days and left wrist swelling. Also reports pain. Denies injuries. Reports sent from UC for shots. Difficulty walking

## 2023-09-09 NOTE — ED Provider Notes (Signed)
 ----------------------------------------- 7:35 PM on 09/09/2023 -----------------------------------------  Blood pressure 132/64, pulse 69, temperature 97.8 F (36.6 C), temperature source Oral, resp. rate 17, height 5' (1.524 m), weight 73 kg, SpO2 100%.  Assuming care from [facility], PA-C/NP-C.  In short, Rachel Quinn is a 78 y.o. female with a chief complaint of Joint Swelling .  Refer to the original H&P for additional details.  The current plan of care is to await pending synovial cell count, and reevaluate the patient.  In addition to his acute knee pain patient is presenting with some decreased mobility.  Discussion was had 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 for possible admission for pain control and evaluation by PT/OT.  Family is agreeable to the plan at this time. ____________________________________________    ED Results / Procedures / Treatments   Labs (all labs ordered are listed, but only abnormal results are displayed) Labs Reviewed  RESP PANEL BY RT-PCR (RSV, FLU A&B, COVID)  RVPGX2 - Abnormal; Notable for the following components:      Result Value   SARS Coronavirus 2 by RT PCR POSITIVE (*)    All other components within normal limits  CBC - Abnormal; Notable for the following components:   WBC 11.0 (*)    Platelets 104 (*)    All other components within normal limits  BASIC METABOLIC PANEL WITH GFR - Abnormal; Notable for the following components:   Sodium 128 (*)    Potassium 3.1 (*)    Chloride 92 (*)    Glucose, Bld 124 (*)    Calcium  7.9 (*)    All other components within normal limits  SYNOVIAL CELL COUNT + DIFF, W/ CRYSTALS - Abnormal; Notable for the following components:   Color, Synovial YELLOW (*)    Appearance-Synovial TURBID (*)    WBC, Synovial 75,505 (*)    All other components within normal limits  BODY FLUID CULTURE W GRAM STAIN  URIC ACID  MAGNESIUM   URINALYSIS, ROUTINE W REFLEX MICROSCOPIC    EKG    RADIOLOGY  I personally viewed  and evaluated these images as part of my medical decision making, as well as reviewing the written report by the radiologist.  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 FINDINGS: Patient is rotated. Widened mediastinum. Otherwise the heart and mediastinal contours are within normal limits. No focal consolidation. No pulmonary edema. No pleural effusion. No pneumothorax. No acute osseous abnormality. IMPRESSION: Widened mediastinum which is likely due to AP portable technique and patient rotation. Recommend repeat PA and lateral view of the chest for further evaluation. Electronically Signed   By: Morgane  Naveau M.D.   On: 09/09/2023 20:13   DG Knee Complete 4 Views Left Result Date: 09/09/2023 EXAM: 4 VIEW(S) XRAY OF THE LEFT KNEE 09/09/2023 02:17:21 PM COMPARISON: None available. CLINICAL HISTORY: Pain, swelling. Pt to ED for left knee swelling x3 days and left wrist swelling. FINDINGS: BONES AND JOINTS: No acute fracture. No focal osseous lesion. No joint dislocation. Medial tibiofemoral joint space narrowing. Mild tricompartmental peripheral spurring. Small to moderate joint effusion. SOFT TISSUES: Suspected varicosities in the subcutaneous tissues medially. Mild soft tissue edema. IMPRESSION: 1. Small to moderate joint effusion. 2. Mild soft tissue edema and suspected varicosities in the subcutaneous tissues medially. 3. Tricompartmental osteoarthritis. Electronically signed by: Andrea Gasman MD 09/09/2023 02:56 PM EDT RP Workstation: [id number]   DG Wrist Complete Left  Result Date: 09/09/2023 EXAM: 3 or more VIEW(S) XRAY OF THE LEFT WRIST 09/09/2023 02:17:21 PM COMPARISON: None available. CLINICAL HISTORY: Pain, swelling. Pt to ED for left knee swelling x3 days and left wrist swelling. FINDINGS: BONES AND JOINTS: No acute fracture. No erosions or  bony destruction. Minimal radial styloid spurring. SOFT TISSUES: Slight soft tissue edema. No soft tissue gas or radiopaque foreign body. IMPRESSION: 1. No acute osseous abnormality. 2. Minimal degenerative change and soft tissue edema. Electronically signed by: Andrea Gasman MD 09/09/2023 02:55 PM EDT RP Workstation: [id number]     PROCEDURES:  Critical Care performed: No  Procedures   MEDICATIONS ORDERED IN ED: Medications  oxyCODONE -acetaminophen  (PERCOCET/ROXICET) 5-325 MG per tablet 1 tablet (1 tablet Oral Given 09/09/23 1512)  sodium chloride  0.9 % bolus 1,000 mL (1,000 mLs Intravenous New Bag/Given 09/09/23 1733)  lidocaine  (PF) (XYLOCAINE ) 1 % injection 5 mL (5 mLs Infiltration Given 09/09/23 1716)  potassium chloride  SA (KLOR-CON  M) CR tablet 40 mEq (40 mEq Oral Given 09/09/23 1734)  ipratropium-albuterol  (DUONEB) 0.5-2.5 (3) MG/3ML nebulizer solution 3 mL (3 mLs Nebulization Given 09/09/23 1926)  oxyCODONE  (Oxy IR/ROXICODONE ) immediate release tablet 5 mg (5 mg Oral Given 09/09/23 1926)     IMPRESSION / MDM / ASSESSMENT AND PLAN / ED COURSE  I reviewed the triage vital signs and the nursing notes.                              Differential diagnosis includes, but is not limited to, COVID, flu, RSV, viral URI, dehydration, electrolyte abnormality, UTI, sepsis, septic arthritis  Patient's presentation is most consistent with acute complicated illness / injury requiring diagnostic workup.  Patient's diagnosis is consistent with COVID infection and hyponatremia in the face of acute left knee pain and effusion.  Patient presents with sudden decreased mobility secondary to pain in the left knee.  Initial presentation was for an acute joint effusion with a joint aspiration showing turbid yellow synovial fluid.  The lab work came back showing an acute hyponatremia and a mild hypokalemia.  Patient also noted to have a mildly elevated WBC.  Magnesium  is normal as was uric acid.  Her COVID  panel did confirm a PCR.  At this time the synovial fluid sample comes back showing greater than 70 5K WBCs with no crystal formation.  Gram staining fluid culture is pending at this time.  Given the geriatric patient's presentation with generalized weakness and acute hyponatremia, and family was advised and offered admission.  They are agreeable to the plan, and the hospitalist has been contacted.  UA is pending, collection at this time.  Patient will be admitted to the hospital service by Dr. Madison Peaches as discussed.     Clinical Course as of 09/09/23 2207  Sat Sep 09, 2023  1739 Basic metabolic panel(!) Hyponatremia; hypokalemia (patient takes potassium pills but has not taken any of her pills today) will replete  Calcium  7.9 [MH]  1741 CBC(!) Very mild leukocytosis [MH]  1741 Uric acid 4.9 [MH]  1741 DG Wrist Complete Left [MH]  1742 IMPRESSION: 1. No acute osseous abnormality. 2. Minimal degenerative change and soft tissue edema.  Will place in wrist splint.  Positive Finkelstein test. [MH]  1743 DG Knee Complete 4 Views Left IMPRESSION: 1. Small to moderate joint effusion. 2. Mild soft tissue edema and suspected varicosities in the subcutaneous tissues medially. 3. Tricompartmental osteoarthritis.   [MH]  1912 Magnesium  wnl [MH]  Clinical Course User Index [MH] Margrette, Myah A, PA-C    FINAL CLINICAL IMPRESSION(S) / ED DIAGNOSES   Final diagnoses:  Acute pain of left knee  Effusion of left knee  Hyponatremia  COVID     Rx / DC Orders   ED Discharge Orders     None        Note:  This document was prepared using Dragon voice recognition software and may include unintentional dictation errors.    Loyd Candida LULLA Aldona, PA-C 09/09/23 2207    Jacolyn Pae, MD 09/09/23 [id number]

## 2023-09-09 NOTE — ED Triage Notes (Signed)
 Patient states that she's having left knee-leg and left wrist pain x 3 days. Patient states that the knee swelling is getting worse causing weakness in her knee. Patient states that she can't walk on the leg. Patient states that she's taking tylenol  with no relief. Patient states that she hasn't fell or injured herself.

## 2023-09-09 NOTE — ED Provider Notes (Signed)
 [facility] Emergency Department Provider Note     Event Date/Time   First MD Initiated Contact with Patient 09/09/23 1350     (approximate)   History   Joint Swelling   HPI  Rachel Quinn is a 78 y.o. female with a PMHx of HTN and CKD presents to the ED for evaluation of left knee swelling and pain x 3 days ago.Pain has worsened today and patient states unable to walk or put weight on the leg. She denies any injuries or falls. No known history of gout. Endorses subjective fevers at home. Has taken tylenol  with no relief.   Also notes left wrist pain x 1 week. No known injuries. Localized to radial aspect. 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    Chart reviewed- Patient sent from UC today for further evaluation of left knee pain and warmth.   Office visit - 08/18 for vaccine reaction from pneumococcal (Prevnar 20)    Physical Exam   Triage Vital Signs: ED Triage Vitals  Encounter Vitals Group     BP 09/09/23 1307 137/81     Girls Systolic BP Percentile --      Girls Diastolic BP Percentile --      Boys Systolic BP Percentile --      Boys Diastolic BP Percentile --      Pulse Rate 09/09/23 1307 73     Resp 09/09/23 1307 18     Temp 09/09/23 1307 99.2 F (37.3 C)     Temp src --      SpO2 09/09/23 1307 98 %     Weight 09/09/23 1310 160 lb 15 oz (73 kg)     Height 09/09/23 1310 5' (1.524 m)     Head Circumference --      Peak Flow --      Pain Score 09/09/23 1309 10     Pain Loc --      Pain Education --      Exclude from Growth Chart --     Most recent vital signs: Vitals:   09/09/23 1307 09/09/23 1844  BP: 137/81 132/64  Pulse: 73 69  Resp: 18 17  Temp: 99.2 F (37.3 C) 97.8 F (36.6 C)  SpO2: 98% 100%    General Awake, no distress.  HEENT NCAT.  CV:  Good peripheral perfusion. RRR RESP:  Normal effort. LCTAB ABD:  No distention.  Other:  Left knee reveals  moderate amount of edema with severe tenderness to palpation.  No redness, however is warm to touch.  Limited range of motion secondary to pain with extension and flexion.  Pedal pulses palpated and are equal bilaterally.  Negative Homans' sign.  Left wrist reveals no deformity.  Tenderness to radial styloid.  Positive Finkelstein test.   ED Results / Procedures / Treatments   Labs (all labs ordered are listed, but only abnormal results are displayed) Labs Reviewed  CBC - Abnormal; Notable for the following components:      Result Value   WBC 11.0 (*)    Platelets 104 (*)    All other components within normal limits  BASIC METABOLIC PANEL WITH GFR - Abnormal; Notable for the following components:   Sodium 128 (*)    Potassium 3.1 (*)    Chloride 92 (*)    Glucose, Bld 124 (*)    Calcium  7.9 (*)    All other components within normal limits  BODY FLUID CULTURE W GRAM STAIN  RESP PANEL BY RT-PCR (RSV, FLU A&B, COVID)  RVPGX2  URIC ACID  MAGNESIUM   SYNOVIAL CELL COUNT + DIFF, W/ CRYSTALS   RADIOLOGY  I personally viewed and evaluated these images as part of my medical decision making, as well as reviewing the written report by the radiologist.  ED Provider Interpretation: Left knee x-ray reveals soft tissue edema.  Tricompartmental osteoarthritis.  Small to moderate joint effusion.  DG Knee Complete 4 Views Left Result Date: 09/09/2023 EXAM: 4 VIEW(S) XRAY OF THE LEFT KNEE 09/09/2023 02:17:21 PM COMPARISON: None available. CLINICAL HISTORY: Pain, swelling. Pt to ED for left knee swelling x3 days and left wrist swelling. FINDINGS: BONES AND JOINTS: No acute fracture. No focal osseous lesion. No joint dislocation. Medial tibiofemoral joint space narrowing. Mild tricompartmental peripheral spurring. Small to moderate joint effusion. SOFT TISSUES: Suspected varicosities in the subcutaneous tissues medially. Mild soft tissue edema. IMPRESSION: 1. Small to moderate joint effusion. 2. Mild  soft tissue edema and suspected varicosities in the subcutaneous tissues medially. 3. Tricompartmental osteoarthritis. Electronically signed by: Andrea Gasman MD 09/09/2023 02:56 PM EDT RP Workstation: [id number]   DG Wrist Complete Left Result Date: 09/09/2023 EXAM: 3 or more VIEW(S) XRAY OF THE LEFT WRIST 09/09/2023 02:17:21 PM COMPARISON: None available. CLINICAL HISTORY: Pain, swelling. Pt to ED for left knee swelling x3 days and left wrist swelling. FINDINGS: BONES AND JOINTS: No acute fracture. No erosions or bony destruction. Minimal radial styloid spurring. SOFT TISSUES: Slight soft tissue edema. No soft tissue gas or radiopaque foreign body. IMPRESSION: 1. No acute osseous abnormality. 2. Minimal degenerative change and soft tissue edema. Electronically signed by: Andrea Gasman MD 09/09/2023 02:55 PM EDT RP Workstation: [id number]    PROCEDURES:  Critical Care performed: No  Procedures  MEDICATIONS ORDERED IN ED: Medications  ipratropium-albuterol  (DUONEB) 0.5-2.5 (3) MG/3ML nebulizer solution 3 mL (has no administration in time range)  oxyCODONE -acetaminophen  (PERCOCET/ROXICET) 5-325 MG per tablet 1 tablet (1 tablet Oral Given 09/09/23 1512)  sodium chloride  0.9 % bolus 1,000 mL (1,000 mLs Intravenous New Bag/Given 09/09/23 1733)  lidocaine  (PF) (XYLOCAINE ) 1 % injection 5 mL (5 mLs Infiltration Given 09/09/23 1716)  potassium chloride  SA (KLOR-CON  M) CR tablet 40 mEq (40 mEq Oral Given 09/09/23 1734)   IMPRESSION / MDM / ASSESSMENT AND PLAN / ED COURSE  I reviewed the triage vital signs and the nursing notes.                              Clinical Course as of 09/09/23 1914  Sat Sep 09, 2023  1739 Basic metabolic panel(!) Hyponatremia; hypokalemia (patient takes potassium pills but has not taken any of her pills today) will replete  Calcium  7.9 [MH]  1741 CBC(!) Very mild leukocytosis [MH]  1741 Uric acid 4.9 [MH]  1741 DG Wrist Complete Left [MH]  1742 IMPRESSION: 1.  No acute osseous abnormality. 2. Minimal degenerative change and soft tissue edema.  Will place in wrist splint.  Positive Finkelstein test. [MH]  1743 DG Knee Complete 4 Views Left IMPRESSION: 1. Small to moderate joint effusion. 2. Mild soft tissue edema and suspected varicosities in the subcutaneous tissues medially. 3. Tricompartmental osteoarthritis.   [MH]  1912 Magnesium  wnl [MH]    Clinical Course User Index [MH] Margrette Rebbeca LABOR, PA-C    78 y.o. female presents to the emergency department for evaluation and treatment of severe acute left knee pain and  swelling and left wrist pain. See HPI for further details.   Differential diagnosis includes, but is not limited to   Wrist pain -fracture, sprain, tendinitis X-ray is reassuring.  Given positive Finkelstein test tendinitis is high on my differential, will place in splint.  Education on RICE therapy and splint provided.  Left knee pain -joint effusion, septic joint, gout  Patient's presentation is most consistent with acute complicated illness / injury requiring diagnostic workup.  Patient is alert and oriented.  She is hemodynamically stable.  Low-grade fever of 99.2. on initial assessment patient struggles to ambulate or bear weight on left leg secondary to pain. Physical exam findings are pertinent for moderate edema and obvious joint effusion to the left knee with warmth to touch.  X-rays reveal joint effusion and soft tissue edema with tricompartmental osteoarthritis.  Joint aspiration was performed by supervising physician Dr. Dorothyann.  Please see procedure note. Synovial fluid sent to lab. pending.   ----------------------------------------- 7:10 PM on 09/09/2023 -----------------------------------------  On reassessment patient is able to bear weight on left leg following joint aspiration however she is unable to ambulate.  During this reassessment patient is beginning to audibly wheeze.  On auscultation there  are no wheezing on lung fields.  Will further workup with chest x-ray, respiratory panel and administer DuoNeb.   Transferring care to [facility] who will follow up on synovial cell count and make appropriate disposition.  Discussed admission with patient for overnight pain control and observation as she is unable to ambulate.  She is agreement with this plan, however will reassess following pain medication and when labs are resulted.  FINAL CLINICAL IMPRESSION(S) / ED DIAGNOSES   Final diagnoses:  Acute pain of left knee   Rx / DC Orders   ED Discharge Orders     None        Note:  This document was prepared using Dragon voice recognition software and may include unintentional dictation errors.    Margrette, [place] Hair A, PA-C 09/09/23 1914    Dorothyann Drivers, MD 09/10/23 2255

## 2023-09-10 ENCOUNTER — Encounter: Payer: Self-pay | Admitting: Family Medicine

## 2023-09-10 ENCOUNTER — Encounter
Admission: EM | Disposition: A | Discharge status: Home Health | Payer: Self-pay | Source: Home / Self Care | Attending: Internal Medicine

## 2023-09-10 ENCOUNTER — Other Ambulatory Visit: Payer: Self-pay

## 2023-09-10 ENCOUNTER — Inpatient Hospital Stay: Admitting: Anesthesiology

## 2023-09-10 ENCOUNTER — Ambulatory Visit: Payer: Self-pay | Admitting: Emergency Medicine

## 2023-09-10 DIAGNOSIS — E039 Hypothyroidism, unspecified: Secondary | ICD-10-CM | POA: Insufficient documentation

## 2023-09-10 DIAGNOSIS — E876 Hypokalemia: Secondary | ICD-10-CM

## 2023-09-10 DIAGNOSIS — M009 Pyogenic arthritis, unspecified: Secondary | ICD-10-CM

## 2023-09-10 DIAGNOSIS — I10 Essential (primary) hypertension: Secondary | ICD-10-CM | POA: Insufficient documentation

## 2023-09-10 DIAGNOSIS — U071 COVID-19: Secondary | ICD-10-CM

## 2023-09-10 DIAGNOSIS — E871 Hypo-osmolality and hyponatremia: Secondary | ICD-10-CM | POA: Diagnosis not present

## 2023-09-10 DIAGNOSIS — E785 Hyperlipidemia, unspecified: Secondary | ICD-10-CM | POA: Insufficient documentation

## 2023-09-10 DIAGNOSIS — M25562 Pain in left knee: Principal | ICD-10-CM

## 2023-09-10 HISTORY — PX: KNEE ARTHROSCOPY: SHX127

## 2023-09-10 LAB — BASIC METABOLIC PANEL WITH GFR
Anion gap: 7 (ref 5–15)
BUN: 15 mg/dL (ref 8–23)
CO2: 23 mmol/L (ref 22–32)
Calcium: 7 mg/dL — ABNORMAL LOW (ref 8.9–10.3)
Chloride: 101 mmol/L (ref 98–111)
Creatinine, Ser: 0.85 mg/dL (ref 0.44–1.00)
GFR, Estimated: >60 mL/min (ref >60)
Glucose, Bld: 139 mg/dL — ABNORMAL HIGH (ref 70–99)
Potassium: 4.2 mmol/L (ref 3.5–5.1)
Sodium: 131 mmol/L — ABNORMAL LOW (ref 135–145)

## 2023-09-10 LAB — CBC
HCT: 32.9 % — ABNORMAL LOW (ref 36.0–46.0)
Hemoglobin: 11.4 g/dL — ABNORMAL LOW (ref 12.0–15.0)
MCH: 30.5 pg (ref 26.0–34.0)
MCHC: 34.7 g/dL (ref 30.0–36.0)
MCV: 88 fL (ref 80.0–100.0)
Platelets: 175 K/uL (ref 150–400)
RBC: 3.74 MIL/uL — ABNORMAL LOW (ref 3.87–5.11)
RDW: 13.5 % (ref 11.5–15.5)
WBC: 11.7 K/uL — ABNORMAL HIGH (ref 4.0–10.5)
nRBC: 0 % (ref 0.0–0.2)

## 2023-09-10 LAB — PROCALCITONIN: Procalcitonin: 0.1 ng/mL

## 2023-09-10 SURGERY — ARTHROSCOPY, KNEE
Anesthesia: General | Site: Knee | Laterality: Left

## 2023-09-10 MED ORDER — LIDOCAINE HCL (CARDIAC) PF 100 MG/5ML IV SOSY
PREFILLED_SYRINGE | INTRAVENOUS | Status: DC | PRN
  Administered 2023-09-10: 40 mg via INTRAVENOUS

## 2023-09-10 MED ORDER — ONDANSETRON HCL 4 MG/2ML IJ SOLN
INTRAMUSCULAR | Status: DC | PRN
  Administered 2023-09-10: 4 mg via INTRAVENOUS

## 2023-09-10 MED ORDER — CEFAZOLIN SODIUM-DEXTROSE 1-4 GM/50ML-% IV SOLN
INTRAVENOUS | Status: DC | PRN
Start: 2023-09-10 — End: 2023-09-10
  Administered 2023-09-10: 2 g via INTRAVENOUS

## 2023-09-10 MED ORDER — KETOROLAC TROMETHAMINE 30 MG/ML IJ SOLN
INTRAMUSCULAR | Status: DC | PRN
  Administered 2023-09-10: 30 mg via INTRAVENOUS

## 2023-09-10 MED ORDER — EPHEDRINE SULFATE-NACL 50-0.9 MG/10ML-% IV SOSY
PREFILLED_SYRINGE | INTRAVENOUS | Status: DC | PRN
  Administered 2023-09-10: 10 mg via INTRAVENOUS

## 2023-09-10 MED ORDER — DEXAMETHASONE SODIUM PHOSPHATE 10 MG/ML IJ SOLN
INTRAMUSCULAR | Status: DC | PRN
Start: 2023-09-10 — End: 2023-09-10
  Administered 2023-09-10: 5 mg via INTRAVENOUS

## 2023-09-10 MED ORDER — MORPHINE SULFATE (PF) 2 MG/ML IV SOLN: 0.5000 mg | INTRAVENOUS | Status: DC | PRN

## 2023-09-10 MED ORDER — VANCOMYCIN HCL 1750 MG/350ML IV SOLN
1750.0000 mg | Freq: Once | INTRAVENOUS | Status: AC
  Administered 2023-09-10: 1750 mg via INTRAVENOUS
  Filled 2023-09-10: qty 350

## 2023-09-10 MED ORDER — VANCOMYCIN HCL 1000 MG IV SOLR
1000.0000 mg | INTRAVENOUS | Status: DC
  Administered 2023-09-11: 1000 mg via INTRAVENOUS
  Filled 2023-09-10 (×2): qty 20

## 2023-09-10 MED ORDER — VANCOMYCIN HCL 1000 MG IV SOLR
INTRAVENOUS | Status: DC | PRN
Start: 2023-09-10 — End: 2023-09-10
  Administered 2023-09-10: 3 g

## 2023-09-10 MED ORDER — ACETAMINOPHEN 10 MG/ML IV SOLN
INTRAVENOUS | Status: DC | PRN
  Administered 2023-09-10: 1000 mg via INTRAVENOUS

## 2023-09-10 MED ORDER — DOCUSATE SODIUM 100 MG PO CAPS
100.0000 mg | ORAL_CAPSULE | Freq: Two times a day (BID) | ORAL | Status: DC
  Administered 2023-09-10 – 2023-09-12 (×4): 100 mg via ORAL
  Filled 2023-09-10 (×5): qty 1

## 2023-09-10 MED ORDER — FENTANYL CITRATE (PF) 100 MCG/2ML IJ SOLN
INTRAMUSCULAR | Status: AC
Start: 2023-09-10 — End: 2023-09-10
  Filled 2023-09-10: qty 2

## 2023-09-10 MED ORDER — BUPIVACAINE HCL (PF) 0.25 % IJ SOLN
INTRAMUSCULAR | Status: AC
Start: 2023-09-10 — End: 2023-09-10
  Filled 2023-09-10: qty 30

## 2023-09-10 MED ORDER — LACTATED RINGERS IV SOLN: INTRAVENOUS | Status: DC | PRN

## 2023-09-10 MED ORDER — METOCLOPRAMIDE HCL 5 MG/ML IJ SOLN: 5.0000 mg | Freq: Three times a day (TID) | INTRAMUSCULAR | Status: DC | PRN

## 2023-09-10 MED ORDER — ALBUTEROL SULFATE HFA 108 (90 BASE) MCG/ACT IN AERS
INHALATION_SPRAY | RESPIRATORY_TRACT | Status: DC | PRN
Start: 2023-09-10 — End: 2023-09-10
  Administered 2023-09-10: 4 via RESPIRATORY_TRACT

## 2023-09-10 MED ORDER — CEFAZOLIN SODIUM-DEXTROSE 2-4 GM/100ML-% IV SOLN
2.0000 g | Freq: Four times a day (QID) | INTRAVENOUS | Status: AC
  Administered 2023-09-10 (×2): 2 g via INTRAVENOUS
  Filled 2023-09-10 (×2): qty 100

## 2023-09-10 MED ORDER — OXYCODONE HCL 5 MG PO TABS: 5.0000 mg | ORAL_TABLET | ORAL | Status: DC | PRN

## 2023-09-10 MED ORDER — ACETAMINOPHEN 10 MG/ML IV SOLN
INTRAVENOUS | Status: AC
  Filled 2023-09-10: qty 100

## 2023-09-10 MED ORDER — POTASSIUM CHLORIDE 20 MEQ PO PACK
40.0000 meq | PACK | Freq: Once | ORAL | Status: AC
  Administered 2023-09-10: 40 meq via ORAL
  Filled 2023-09-10: qty 2

## 2023-09-10 MED ORDER — PROPOFOL 10 MG/ML IV BOLUS
INTRAVENOUS | Status: DC | PRN
  Administered 2023-09-10: 100 mg via INTRAVENOUS
  Administered 2023-09-10: 20 mg via INTRAVENOUS

## 2023-09-10 MED ORDER — LIDOCAINE-EPINEPHRINE 1 %-1:100000 IJ SOLN
INTRAMUSCULAR | Status: AC
  Filled 2023-09-10: qty 1

## 2023-09-10 MED ORDER — METOCLOPRAMIDE HCL 5 MG PO TABS: 5.0000 mg | ORAL_TABLET | Freq: Three times a day (TID) | ORAL | Status: DC | PRN

## 2023-09-10 MED ORDER — ACETAMINOPHEN 500 MG PO TABS
500.0000 mg | ORAL_TABLET | Freq: Four times a day (QID) | ORAL | Status: DC
  Administered 2023-09-10 – 2023-09-11 (×3): 500 mg via ORAL
  Filled 2023-09-10 (×4): qty 1

## 2023-09-10 MED ORDER — SODIUM CHLORIDE 0.9 % IR SOLN
Status: DC | PRN
  Administered 2023-09-10: 9000 mL

## 2023-09-10 MED ORDER — FENTANYL CITRATE (PF) 100 MCG/2ML IJ SOLN
INTRAMUSCULAR | Status: DC | PRN
  Administered 2023-09-10 (×2): 50 ug via INTRAVENOUS

## 2023-09-10 SURGICAL SUPPLY — 24 items
BLADE SHAVER 4.5X7 STR FR (MISCELLANEOUS) ×2 IMPLANT
BNDG COMPR 6X5.8 VLCR NS LF (GAUZE/BANDAGES/DRESSINGS) ×2 IMPLANT
BNDG ELASTIC 6INX 5YD STR LF (GAUZE/BANDAGES/DRESSINGS) ×2 IMPLANT
BNDG ESMARCH 6X12 STRL LF (GAUZE/BANDAGES/DRESSINGS) ×2 IMPLANT
CHLORAPREP W/TINT 26 (MISCELLANEOUS) ×2 IMPLANT
CNTNR URN SCR LID CUP LEK RST (MISCELLANEOUS) IMPLANT
DERMABOND ADVANCED .7 DNX12 (GAUZE/BANDAGES/DRESSINGS) ×2 IMPLANT
DRAPE ARTHROSCOPY W/POUCH 90 (DRAPES) ×2 IMPLANT
DRAPE IMP U-DRAPE 54X76 (DRAPES) ×2 IMPLANT
DRSG TEGADERM 4X4.75 (GAUZE/BANDAGES/DRESSINGS) IMPLANT
EVACUATOR 1/8 PVC DRAIN (DRAIN) IMPLANT
GAUZE SPONGE 4X4 12PLY STRL (GAUZE/BANDAGES/DRESSINGS) ×2 IMPLANT
GAUZE XEROFORM 1X8 LF (GAUZE/BANDAGES/DRESSINGS) IMPLANT
GLOVE PI ORTHO PRO STRL 7.5 (GLOVE) ×4 IMPLANT
GLOVE SURG SYN 7.5 PF PI (GLOVE) ×2 IMPLANT
GOWN SRG XL LVL 3 NONREINFORCE (GOWNS) ×2 IMPLANT
GOWN STRL REUS W/ TWL LRG LVL3 (GOWN DISPOSABLE) ×2 IMPLANT
KIT TURNOVER KIT A (KITS) ×2 IMPLANT
MANIFOLD NEPTUNE II (INSTRUMENTS) ×2 IMPLANT
MAT ABSORB FLUID 56X50 GRAY (MISCELLANEOUS) ×2 IMPLANT
PACK ARTHROSCOPY KNEE (MISCELLANEOUS) ×2 IMPLANT
PAD ABD DERMACEA PRESS 5X9 (GAUZE/BANDAGES/DRESSINGS) ×2 IMPLANT
SPONGE DRAIN TRACH 4X4 STRL 2S (GAUZE/BANDAGES/DRESSINGS) IMPLANT
TUBE SET DOUBLEFLO INFLOW (TUBING) ×2 IMPLANT

## 2023-09-10 NOTE — Transfer of Care (Signed)
 Immediate Anesthesia Transfer of Care Note  Patient: Rachel Quinn  Procedure(s) Performed: ARTHROSCOPY, KNEE (Left: Knee)  Patient Location: PACU and Nursing Unit  Anesthesia Type:General  Level of Consciousness: awake, alert , and oriented  Airway & Oxygen Therapy: Patient Spontanous Breathing and Patient connected to nasal cannula oxygen  Post-op Assessment: Report given to RN, Post -op Vital signs reviewed and stable, and Patient moving all extremities  Post vital signs: Reviewed and stable  Last Vitals:  Vitals Value Taken Time  BP    Temp    Pulse    Resp    SpO2      Last Pain:  Vitals:   09/10/23 0830  TempSrc: Oral  PainSc: 0-No pain         Complications: No notable events documented.

## 2023-09-10 NOTE — H&P (Signed)
 [place]   PATIENT NAME: Rachel Quinn    MR#:  [id number]  DATE OF BIRTH:  Sep 26, 1945  DATE OF ADMISSION:  09/09/2023  PRIMARY CARE PHYSICIAN: Wendee Lynwood HERO, NP   Patient is coming from: Home  REQUESTING/REFERRING PHYSICIAN: Menshew, Candida LULLA Kings, PA-C.  CHIEF COMPLAINT:   Chief Complaint  Patient presents with   Joint Swelling    HISTORY OF PRESENT ILLNESS:  Rachel Quinn is a 78 y.o. female with medical history significant for essential hypertension, hypothyroidism, dyslipidemia and chronic kidney disease, who presented to the emergency room with acute onset of left knee pain which has been worsening today.  She has been having fever and chills a couple of days ago.  She admitted to diminished appetite as well as nausea and vomiting without diarrhea.  She has been having dry cough and occasional wheezing with no significant dyspnea.  No dysuria, oliguria or hematuria or flank pain.  No diarrhea or melena or bright red bleeding per rectum.  No other bleeding diathesis.  ED Course: Upon presentation to the emergency room, blood pressure initially 156/79 with otherwise normal vital signs.  Labs revealed hyponatremia 128 and hypochloremia of 92 with potassium of 3.1 calcium  7.9 otherwise unremarkable CMP.  CBC showed WBCs 11 and thrombocytopenia of 104.  Respiratory panel came back positive for COVID-19.  Left knee synovial fluid analysis 71 neutrophils 23 lymphocytes 16 monocytes and WBCs of 75,505. EKG as reviewed by me : None. Imaging: Left knee 4 view x-ray showed the following: 1. Small to moderate joint effusion. 2. Mild soft tissue edema and suspected varicosities in the subcutaneous tissues medially. 3. Tricompartmental osteoarthritis.  Left wrist x-ray showed minimal degenerative changes and soft tissue edema with no acute osseous abnormalities.  The patient was given DuoNeb, oxycodone  5 mg, 1 p.o. Percocet, 40 mcg p.o. potassium chloride  and 1  L bolus of normal saline.  She will be admitted to a medical telemetry bed for further evaluation and management.  PAST MEDICAL HISTORY:   Past Medical History:  Diagnosis Date   Cataract    Chronic kidney disease    Heart murmur    Hypertension    Kidney stone    Thyroid  disease     PAST SURGICAL HISTORY:   Past Surgical History:  Procedure Laterality Date   CATARACT EXTRACTION Bilateral 01/2020   KIDNEY SURGERY  2000   NEPHRECTOMY Left 01/18/1999   2/2 nephrolithiasis   THYROIDECTOMY  01/17/2002   TUBAL LIGATION      SOCIAL HISTORY:   Social History   Tobacco Use   Smoking status: Never   Smokeless tobacco: Never  Substance Use Topics   Alcohol use: No    Alcohol/week: 0.0 standard drinks of alcohol    FAMILY HISTORY:   Family History  Problem Relation Age of Onset   Healthy Mother    Healthy Father    Cancer Neg Hx    Heart attack Neg Hx    Stroke Neg Hx    Colon cancer Neg Hx    Esophageal cancer Neg Hx    Rectal cancer Neg Hx    Stomach cancer Neg Hx     DRUG ALLERGIES:  No Known Allergies  REVIEW OF SYSTEMS:   ROS As per history of present illness. All pertinent systems were reviewed above. Constitutional, HEENT, cardiovascular, respiratory, GI, GU, musculoskeletal, neuro, psychiatric, endocrine, integumentary and hematologic systems were reviewed and are otherwise negative/unremarkable except for positive findings mentioned above  in the HPI.   MEDICATIONS AT HOME:   Prior to Admission medications   Medication Sig Start Date End Date Taking? Authorizing Provider  albuterol  (VENTOLIN  HFA) 108 (90 Base) MCG/ACT inhaler Inhale 2 puffs into the lungs every 6 (six) hours as needed for wheezing or shortness of breath (Cough). 01/24/23   Tower, Laine LABOR, MD  atenolol -chlorthalidone  (TENORETIC ) 50-25 MG tablet Take 1 tablet by mouth once daily 04/06/23   Wendee Lynwood HERO, NP  atorvastatin  (LIPITOR) 10 MG tablet Take 1 tablet by mouth once daily 07/10/23    Wendee Lynwood HERO, NP  calcium  carbonate (OS-CAL - DOSED IN MG OF ELEMENTAL CALCIUM ) 1250 (500 Ca) MG tablet Take 1 tablet by mouth.    [provider]  levothyroxine  (SYNTHROID ) 75 MCG tablet Take 1 tablet (75 mcg total) by mouth daily. 09/01/23   Webb, Padonda B, FNP  potassium citrate  (UROCIT-K ) 10 MEQ (1080 MG) SR tablet Take 1 tablet (10 mEq total) by mouth daily. 04/03/23   Wendee Lynwood HERO, NP      VITAL SIGNS:  Blood pressure 132/66, pulse 83, temperature 98.7 F (37.1 C), temperature source Oral, resp. rate 18, height 5' (1.524 m), weight 73.5 kg, SpO2 98%.  PHYSICAL EXAMINATION:  Physical Exam  GENERAL:  78 y.o.-year-old female patient lying in the bed with no acute distress.  EYES: Pupils equal, round, reactive to light and accommodation. No scleral icterus. Extraocular muscles intact.  HEENT: Head atraumatic, normocephalic. Oropharynx and nasopharynx clear.  NECK:  Supple, no jugular venous distention. No thyroid  enlargement, no tenderness.  LUNGS: Normal breath sounds bilaterally, no wheezing, rales,rhonchi or crepitation. No use of accessory muscles of respiration.  CARDIOVASCULAR: Regular rate and rhythm, S1, S2 normal. No murmurs, rubs, or gallops.  ABDOMEN: Soft, nondistended, nontender. Bowel sounds present. No organomegaly or mass.  EXTREMITIES: No pedal edema, cyanosis, or clubbing. Musculoskeletal: Mild left knee swelling with tenderness and warmth with mildly diminished range of motion. NEUROLOGIC: Cranial nerves II through XII are intact. Muscle strength 5/5 in all extremities. Sensation intact. Gait not checked.  PSYCHIATRIC: The patient is alert and oriented x 3.  Normal affect and good eye contact. SKIN: No obvious rash, lesion, or ulcer.   LABORATORY PANEL:   CBC Recent Labs  Lab 09/09/23 1513  WBC 11.0*  HGB 13.5  HCT 41.1  PLT 104*    ------------------------------------------------------------------------------------------------------------------  Chemistries  Recent Labs  Lab 09/09/23 1513  NA 128*  K 3.1*  CL 92*  CO2 22  GLUCOSE 124*  BUN 13  CREATININE 0.89  CALCIUM  7.9*  MG 1.9   ------------------------------------------------------------------------------------------------------------------  Cardiac Enzymes No results for input(s): TROPONINI in the last 168 hours. ------------------------------------------------------------------------------------------------------------------  RADIOLOGY:  DG Chest 1 View Result Date: 09/09/2023 CLINICAL DATA:  wheezing EXAM: CHEST  1 VIEW COMPARISON:  None Available. FINDINGS: Patient is rotated. Widened mediastinum. Otherwise the heart and mediastinal contours are within normal limits. No focal consolidation. No pulmonary edema. No pleural effusion. No pneumothorax. No acute osseous abnormality. IMPRESSION: Widened mediastinum which is likely due to AP portable technique and patient rotation. Recommend repeat PA and lateral view of the chest for further evaluation. Electronically Signed   By: Morgane  Naveau M.D.   On: 09/09/2023 20:13   DG Knee Complete 4 Views Left Result Date: 09/09/2023 EXAM: 4 VIEW(S) XRAY OF THE LEFT KNEE 09/09/2023 02:17:21 PM COMPARISON: None available. CLINICAL HISTORY: Pain, swelling. Pt to ED for left knee swelling x3 days and left wrist swelling. FINDINGS: BONES AND JOINTS:  No acute fracture. No focal osseous lesion. No joint dislocation. Medial tibiofemoral joint space narrowing. Mild tricompartmental peripheral spurring. Small to moderate joint effusion. SOFT TISSUES: Suspected varicosities in the subcutaneous tissues medially. Mild soft tissue edema. IMPRESSION: 1. Small to moderate joint effusion. 2. Mild soft tissue edema and suspected varicosities in the subcutaneous tissues medially. 3. Tricompartmental osteoarthritis. Electronically  signed by: Andrea Gasman MD 09/09/2023 02:56 PM EDT RP Workstation: [id number]   DG Wrist Complete Left Result Date: 09/09/2023 EXAM: 3 or more VIEW(S) XRAY OF THE LEFT WRIST 09/09/2023 02:17:21 PM COMPARISON: None available. CLINICAL HISTORY: Pain, swelling. Pt to ED for left knee swelling x3 days and left wrist swelling. FINDINGS: BONES AND JOINTS: No acute fracture. No erosions or bony destruction. Minimal radial styloid spurring. SOFT TISSUES: Slight soft tissue edema. No soft tissue gas or radiopaque foreign body. IMPRESSION: 1. No acute osseous abnormality. 2. Minimal degenerative change and soft tissue edema. Electronically signed by: Andrea Gasman MD 09/09/2023 02:55 PM EDT RP Workstation: [id number]      IMPRESSION AND PLAN:  Assessment and Plan: * Hyponatremia - The patient be admitted to a medical telemetry bed. - We will continue hydration with IV normal saline. - We will limit p.o. fluid intake. - Will follow BMPs.  Acute pain of left knee - Synovial fluid analysis showed leukocytosis that is not significant though. - Will place her on IV Rocephin  for now and follow Gram stain and culture results. - Orthopedic consult will be obtained. - I notified Dr. Lorelle about the patient.  COVID-19 virus infection - The patient had resolved fever.  She had no hypoxia. - Will place on as needed mucolytic's and hydration as well as as needed DuoNebs.   Hypokalemia Will replace potassium and check magnesium  level.  Essential hypertension - Will continue antihypertensive therapy.  Hypothyroidism - Continue Synthroid .  Dyslipidemia - With continue statin therapy.   DVT prophylaxis: Lovenox . Advanced Care Planning:  Code Status: full code. Family Communication:  The plan of care was discussed in details with the patient (and family). I answered all questions. The patient agreed to proceed with the above mentioned plan. Further management will depend upon hospital  course. Disposition Plan: Back to previous home environment Consults called: Orthopedic consult All the records are reviewed and case discussed with ED provider.  Status is: Inpatient  At the time of the admission, it appears that the appropriate admission status for this patient is inpatient.  This is judged to be reasonable and necessary in order to provide the required intensity of service to ensure the patient's safety given the presenting symptoms, physical exam findings and initial radiographic and laboratory data in the context of comorbid conditions.  The patient requires inpatient status due to high intensity of service, high risk of further deterioration and high frequency of surveillance required.  I certify that at the time of admission, it is my clinical judgment that the patient will require inpatient hospital care extending more than 2 midnights.                            Dispo: The patient is from: Home              Anticipated d/c is to: Home              Patient currently is not medically stable to d/c.              Difficult to place  patient: No  [place]ison DELENA Peaches M.D on 09/10/2023 at 2:46 AM  [facility]   From 7 PM-7 AM, contact night-coverage [email or web address]  CC: Primary care physician; Wendee Lynwood HERO, NP

## 2023-09-10 NOTE — Progress Notes (Signed)
 PT Cancellation Note  Patient Details Name: Rachel Quinn MRN: [id number] DOB: April 26, 1945   Cancelled Treatment:    Reason Eval/Treat Not Completed: Patient at procedure or test/unavailable.  PT consult received.  Pt currently off floor for procedure.  Will monitor pt's status and re-attempt PT evaluation at a later date/time as medically appropriate.  Damien Caulk, PT 09/10/23, 12:03 PM

## 2023-09-10 NOTE — Assessment & Plan Note (Signed)
-   The patient had resolved fever.  She had no hypoxia. - Will place on as needed mucolytic's and hydration as well as as needed DuoNebs.

## 2023-09-10 NOTE — Plan of Care (Signed)
  Problem: Respiratory: Goal: Complications related to the disease process, condition or treatment will be avoided or minimized Outcome: Progressing   

## 2023-09-10 NOTE — Assessment & Plan Note (Signed)
-   The patient be admitted to a medical telemetry bed. - We will continue hydration with IV normal saline. - We will limit p.o. fluid intake. - Will follow BMPs.

## 2023-09-10 NOTE — Assessment & Plan Note (Signed)
-   Will replace potassium and check magnesium level.

## 2023-09-10 NOTE — Progress Notes (Signed)
 PROGRESS NOTE    Rachel Quinn  FMW:[id number] DOB: 05-03-1945 DOA: 09/09/2023 PCP: Wendee Lynwood HERO, NP    Brief Narrative:   78 y.o. female with medical history significant for essential hypertension, hypothyroidism, dyslipidemia and chronic kidney disease, who presented to the emergency room with acute onset of left knee pain which has been worsening today.  She has been having fever and chills a couple of days ago.  She admitted to diminished appetite as well as nausea and vomiting without diarrhea.  She has been having dry cough and occasional wheezing with no significant dyspnea.  No dysuria, oliguria or hematuria or flank pain.  No diarrhea or melena or bright red bleeding per rectum.  No other bleeding diathesis.    Assessment & Plan:   Principal Problem:   Hyponatremia Active Problems:   Acute pain of left knee   COVID-19 virus infection   Hypokalemia   Dyslipidemia   Hypothyroidism   Essential hypertension   Septic arthritis left knee Synovial fluid analysis with white blood cell count 75,000.   Plan: Orthopedics planning for surgical washout today Empiric vancomycin  Follow-up fluid studies Anticipate need for long-term IV antibiotics Pain control  * Hyponatremia Mild and improving.  IV fluids     COVID-19 virus infection Asymptomatic.  I suspect the fever was driven by the septic arthritis.  No hypoxia and no infiltrate.  Isolation precautions     Hypokalemia 100 and replace as needed   Essential hypertension Continue current regimen   Hypothyroidism Continue Synthroid    Dyslipidemia Statin     DVT prophylaxis: On hold for OR Code Status: Full Family Communication: Spouse at bedside 8/24 Disposition Plan: Status is: Inpatient Remains inpatient appropriate because: Septic arthritis left knee   Level of care: Telemetry Medical  Consultants:  Orthopedics  Procedures:  Left knee incision and  drainage  Antimicrobials: Vancomycin    Subjective: Seen and examined.  Resting in bed.  Minimal pain left knee.  Otherwise no complaints.  Objective: Vitals:   09/09/23 2230 09/09/23 2250 09/10/23 0333 09/10/23 0830  BP: 130/62 132/66 115/78 119/61  Pulse: 79 83 67 (!) 59  Resp: 16 18 16 19   Temp: 97.9 F (36.6 C) 98.7 F (37.1 C) 98 F (36.7 C) 98.9 F (37.2 C)  TempSrc: Oral Oral Oral Oral  SpO2: 97% 98% 98% 98%  Weight:  73.5 kg    Height:  5' (1.524 m)      Intake/Output Summary (Last 24 hours) at 09/10/2023 1230 Last data filed at 09/10/2023 0514 Gross per 24 hour  Intake 1536.64 ml  Output --  Net 1536.64 ml   Filed Weights   09/09/23 1310 09/09/23 2250  Weight: 73 kg 73.5 kg    Examination:  General exam: Appears calm and comfortable  Respiratory system: Clear to auscultation. Respiratory effort normal. Cardiovascular system: 1 S2, RRR, no murmurs, no pedal edema Gastrointestinal system: Soft, NT/ND, normal bowel sounds Central nervous system: Alert and oriented. No focal neurological deficits. Extremities: Left knee swollen, tender to touch, decreased ROM Skin: No rashes, lesions or ulcers Psychiatry: Judgement and insight appear normal. Mood & affect appropriate.     Data Reviewed: I have personally reviewed following labs and imaging studies  CBC: Recent Labs  Lab 09/09/23 1513 09/10/23 0555  WBC 11.0* 11.7*  HGB 13.5 11.4*  HCT 41.1 32.9*  MCV 93.4 88.0  PLT 104* 175   Basic Metabolic Panel: Recent Labs  Lab 09/09/23 1513 09/10/23 0555  NA 128* 131*  K 3.1* 4.2  CL 92* 101  CO2 22 23  GLUCOSE 124* 139*  BUN 13 15  CREATININE 0.89 0.85  CALCIUM  7.9* 7.0*  MG 1.9  --    GFR: Estimated Creatinine Clearance: 50.4 mL/min (by C-G formula based on SCr of 0.85 mg/dL). Liver Function Tests: No results for input(s): AST, ALT, ALKPHOS, BILITOT, PROT, ALBUMIN in the last 168 hours. No results for input(s): LIPASE, AMYLASE  in the last 168 hours. No results for input(s): AMMONIA in the last 168 hours. Coagulation Profile: No results for input(s): INR, PROTIME in the last 168 hours. Cardiac Enzymes: No results for input(s): CKTOTAL, CKMB, CKMBINDEX, TROPONINI in the last 168 hours. BNP (last 3 results) No results for input(s): PROBNP in the last 8760 hours. HbA1C: No results for input(s): HGBA1C in the last 72 hours. CBG: No results for input(s): GLUCAP in the last 168 hours. Lipid Profile: No results for input(s): CHOL, HDL, LDLCALC, TRIG, CHOLHDL, LDLDIRECT in the last 72 hours. Thyroid  Function Tests: No results for input(s): TSH, T4TOTAL, FREET4, T3FREE, THYROIDAB in the last 72 hours. Anemia Panel: No results for input(s): VITAMINB12, FOLATE, FERRITIN, TIBC, IRON, RETICCTPCT in the last 72 hours. Sepsis Labs: Recent Labs  Lab 09/10/23 0555  PROCALCITON 0.10    Recent Results (from the past 240 hours)  Body fluid culture w Gram Stain     Status: None (Preliminary result)   Collection Time: 09/09/23  6:52 PM   Specimen: Synovial, Left Knee; Body Fluid  Result Value Ref Range Status   Specimen Description   Final    Synov, Knee Performed at [facility], [address], [place], [place] [address]    Special Requests   Final    NONE Performed at [facility], [address] [place], [place] [address]    Gram Stain   Final    WBC PRESENT,BOTH PMN AND MONONUCLEAR NO ORGANISMS SEEN CYTOSPIN SMEAR Performed at [facility], [address] [place], [place] [address]    Culture PENDING  Incomplete   Report Status PENDING  Incomplete  Resp panel by RT-PCR (RSV, Flu A&B, Covid) Anterior Nasal Swab     Status: Abnormal   Collection Time: 09/09/23  7:29 PM   Specimen: Anterior Nasal Swab  Result Value Ref Range Status   SARS Coronavirus 2 by RT PCR POSITIVE (A) NEGATIVE Final    Comment: (NOTE) SARS-CoV-2 target  nucleic acids are DETECTED.  The SARS-CoV-2 RNA is generally detectable in upper respiratory specimens during the acute phase of infection. Positive results are indicative of the presence of the identified virus, but do not rule out bacterial infection or co-infection with other pathogens not detected by the test. Clinical correlation with patient history and other diagnostic information is necessary to determine patient infection status. The expected result is Negative.  Fact Sheet for Patients: BloggerCourse.com  Fact Sheet for Healthcare Providers: SeriousBroker.it  This test is not yet approved or cleared by the [place]  FDA and  has been authorized for detection and/or diagnosis of SARS-CoV-2 by FDA under an Emergency Use Authorization (EUA).  This EUA will remain in effect (meaning this test can be used) for the duration of  the COVID-19 declaration under Section 564(b)(1) of the A ct, 21 U.S.C. section 360bbb-3(b)(1), unless the authorization is terminated or revoked sooner.     Influenza A by PCR NEGATIVE NEGATIVE Final   Influenza B by PCR NEGATIVE NEGATIVE Final    Comment: (NOTE) The Xpert  Xpress SARS-CoV-2/FLU/RSV plus assay is intended as an aid in the diagnosis of influenza from Nasopharyngeal swab specimens and should not be used as a sole basis for treatment. Nasal washings and aspirates are unacceptable for Xpert Xpress SARS-CoV-2/FLU/RSV testing.  Fact Sheet for Patients: BloggerCourse.com  Fact Sheet for Healthcare Providers: SeriousBroker.it  This test is not yet approved or cleared by the [place]  FDA and has been authorized for detection and/or diagnosis of SARS-CoV-2 by FDA under an Emergency Use Authorization (EUA). This EUA will remain in effect (meaning this test can be used) for the duration of the COVID-19 declaration under Section  564(b)(1) of the Act, 21 U.S.C. section 360bbb-3(b)(1), unless the authorization is terminated or revoked.     Resp Syncytial Virus by PCR NEGATIVE NEGATIVE Final    Comment: (NOTE) Fact Sheet for Patients: BloggerCourse.com  Fact Sheet for Healthcare Providers: SeriousBroker.it  This test is not yet approved or cleared by the [place]  FDA and has been authorized for detection and/or diagnosis of SARS-CoV-2 by FDA under an Emergency Use Authorization (EUA). This EUA will remain in effect (meaning this test can be used) for the duration of the COVID-19 declaration under Section 564(b)(1) of the Act, 21 U.S.C. section 360bbb-3(b)(1), unless the authorization is terminated or revoked.  Performed at [facility], [address] [place], [place] [address]          Radiology Studies: DG Chest 1 View Result Date: 09/09/2023 CLINICAL DATA:  wheezing EXAM: CHEST  1 VIEW COMPARISON:  None Available. FINDINGS: Patient is rotated. Widened mediastinum. Otherwise the heart and mediastinal contours are within normal limits. No focal consolidation. No pulmonary edema. No pleural effusion. No pneumothorax. No acute osseous abnormality. IMPRESSION: Widened mediastinum which is likely due to AP portable technique and patient rotation. Recommend repeat PA and lateral view of the chest for further evaluation. Electronically Signed   By: Morgane  Naveau M.D.   On: 09/09/2023 20:13   DG Knee Complete 4 Views Left Result Date: 09/09/2023 EXAM: 4 VIEW(S) XRAY OF THE LEFT KNEE 09/09/2023 02:17:21 PM COMPARISON: None available. CLINICAL HISTORY: Pain, swelling. Pt to ED for left knee swelling x3 days and left wrist swelling. FINDINGS: BONES AND JOINTS: No acute fracture. No focal osseous lesion. No joint dislocation. Medial tibiofemoral joint space narrowing. Mild tricompartmental peripheral spurring. Small to moderate joint effusion. SOFT  TISSUES: Suspected varicosities in the subcutaneous tissues medially. Mild soft tissue edema. IMPRESSION: 1. Small to moderate joint effusion. 2. Mild soft tissue edema and suspected varicosities in the subcutaneous tissues medially. 3. Tricompartmental osteoarthritis. Electronically signed by: Andrea Gasman MD 09/09/2023 02:56 PM EDT RP Workstation: [id number]   DG Wrist Complete Left Result Date: 09/09/2023 EXAM: 3 or more VIEW(S) XRAY OF THE LEFT WRIST 09/09/2023 02:17:21 PM COMPARISON: None available. CLINICAL HISTORY: Pain, swelling. Pt to ED for left knee swelling x3 days and left wrist swelling. FINDINGS: BONES AND JOINTS: No acute fracture. No erosions or bony destruction. Minimal radial styloid spurring. SOFT TISSUES: Slight soft tissue edema. No soft tissue gas or radiopaque foreign body. IMPRESSION: 1. No acute osseous abnormality. 2. Minimal degenerative change and soft tissue edema. Electronically signed by: Andrea Gasman MD 09/09/2023 02:55 PM EDT RP Workstation: [id number]        Scheduled Meds:  [MAR Hold] atenolol   50 mg Oral Daily   [MAR Hold] atorvastatin   10 mg Oral Daily   [MAR Hold] calcium  carbonate  1 tablet Oral BID WC   [MAR Hold] chlorthalidone   25  mg Oral Daily   [MAR Hold] enoxaparin  (LOVENOX ) injection  40 mg Subcutaneous Q24H   [MAR Hold] levothyroxine   75 mcg Oral Daily   Continuous Infusions:  sodium chloride  100 mL/hr at 09/09/23 2351   [MAR Hold] vancomycin        LOS: 1 day     Calvin KATHEE Robson, MD [facility]   If 7PM-7AM, please contact night-coverage  09/10/2023, 12:30 PM

## 2023-09-10 NOTE — Assessment & Plan Note (Addendum)
-   Synovial fluid analysis showed leukocytosis that is not significant though. - Will place her on IV Rocephin  for now and follow Gram stain and culture results. - Orthopedic consult will be obtained. - I notified Dr. Lorelle about the patient.

## 2023-09-10 NOTE — Assessment & Plan Note (Signed)
-   Will continue antihypertensive therapy.

## 2023-09-10 NOTE — Consult Note (Signed)
 ORTHOPAEDIC CONSULTATION  REQUESTING PHYSICIAN: [name], MD  Chief Complaint:   Left knee effusion concern for septic arthritis  History of Present Illness: Rachel Quinn is a 78 y.o. female with medical history significant for essential hypertension, hypothyroidism, dyslipidemia and chronic kidney disease who presented to the emergency room yesterday after several days of worsening left knee pain and swelling.  She been having fevers and chills for a few days as well as a nonproductive cough.  She reports she has had pain in her knee in the past but many years ago and none recently.  No trauma to the knee or anywhere else.  She denies any cuts scrapes or any recent infections.  She denies any recent dental procedures.  She does report she got an injection in her left arm little over a week ago and had significant erythema and pain in her upper extremity after that injection with warmth.  She had not sought care for the arm after that vaccine.  Reports improvement in pain after knee aspiration yesterday but still severe pain with any motion and inability to bear weight.   Patient also reports some left wrist pain very mild over the base of the thumb.  No swelling or significant changes or erythema to the area noted.  Unsure when the wrist pain began but believes it was before the knee.  Past Medical History:  Diagnosis Date   Cataract    Chronic kidney disease    Heart murmur    Hypertension    Kidney stone    Thyroid  disease    Past Surgical History:  Procedure Laterality Date   CATARACT EXTRACTION Bilateral 01/2020   KIDNEY SURGERY  2000   NEPHRECTOMY Left 01/18/1999   2/2 nephrolithiasis   THYROIDECTOMY  01/17/2002   TUBAL LIGATION     Social History   Socioeconomic History   Marital status: Married    Spouse name: Not on file   Number of children: 9   Years of education: college   Highest  education level: Not on file  Occupational History   Not on file  Tobacco Use   Smoking status: Never   Smokeless tobacco: Never  Vaping Use   Vaping status: Never Used  Substance and Sexual Activity   Alcohol use: No    Alcohol/week: 0.0 standard drinks of alcohol   Drug use: No   Sexual activity: Not Currently  Other Topics Concern   Not on file  Social History Narrative   Lives with son Elene) and his family   [place]  20 [place] ago   Enjoys: spending time with family, church Sunday, 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   Exercise: taking care of her granddaughter (22 years old), walking daily   Diet: rice, fish, chicken, veggies, fruit   Social Drivers of Health   Financial Resource Strain: Low Risk  (09/21/2022)   Overall Financial Resource Strain (CARDIA)    Difficulty of Paying Living Expenses: Not hard at all  Food Insecurity: No Food Insecurity (09/10/2023)   Hunger Vital Sign    Worried About Running Out of Food in the Last Year: Never true    Ran Out of Food in the Last Year: Never true  Transportation Needs: No Transportation Needs (09/10/2023)   PRAPARE - Administrator, Civil Service (Medical): No    Lack of Transportation (Non-Medical): No  Physical Activity: Sufficiently Active (09/21/2022)   Exercise Vital Sign    Days  of Exercise per Week: 6 days    Minutes of Exercise per Session: 30 min  Stress: No Stress Concern Present (09/21/2022)   [employer] of Occupational Health - Occupational Stress Questionnaire    Feeling of Stress : Not at all  Social Connections: Socially Integrated (09/10/2023)   Social Connection and Isolation Panel    Frequency of Communication with Friends and Family: More than three times a week    Frequency of Social Gatherings with Friends and Family: More than three times a week    Attends Religious Services: More than 4 times per year    Active Member of [employer] or Organizations: Yes    Attends  Banker Meetings: 1 to 4 times per year    Marital Status: Married   Family History  Problem Relation Age of Onset   Healthy Mother    Healthy Father    Cancer Neg Hx    Heart attack Neg Hx    Stroke Neg Hx    Colon cancer Neg Hx    Esophageal cancer Neg Hx    Rectal cancer Neg Hx    Stomach cancer Neg Hx    No Known Allergies Prior to Admission medications   Medication Sig Start Date End Date Taking? Authorizing Provider  albuterol  (VENTOLIN  HFA) 108 (90 Base) MCG/ACT inhaler Inhale 2 puffs into the lungs every 6 (six) hours as needed for wheezing or shortness of breath (Cough). 01/24/23   Tower, Laine LABOR, MD  atenolol -chlorthalidone  (TENORETIC ) 50-25 MG tablet Take 1 tablet by mouth once daily 04/06/23   Cable, James M, NP  atorvastatin  (LIPITOR) 10 MG tablet Take 1 tablet by mouth once daily 07/10/23   Wendee Lynwood HERO, NP  calcium  carbonate (OS-CAL - DOSED IN MG OF ELEMENTAL CALCIUM ) 1250 (500 Ca) MG tablet Take 1 tablet by mouth.    [provider]  levothyroxine  (SYNTHROID ) 75 MCG tablet Take 1 tablet (75 mcg total) by mouth daily. 09/01/23   Webb, Padonda B, FNP  potassium citrate  (UROCIT-K ) 10 MEQ (1080 MG) SR tablet Take 1 tablet (10 mEq total) by mouth daily. 04/03/23   Wendee Lynwood HERO, NP   DG Chest 1 View Result Date: 09/09/2023 CLINICAL DATA:  wheezing EXAM: CHEST  1 VIEW COMPARISON:  None Available. FINDINGS: Patient is rotated. Widened mediastinum. Otherwise the heart and mediastinal contours are within normal limits. No focal consolidation. No pulmonary edema. No pleural effusion. No pneumothorax. No acute osseous abnormality. IMPRESSION: Widened mediastinum which is likely due to AP portable technique and patient rotation. Recommend repeat PA and lateral view of the chest for further evaluation. Electronically Signed   By: Morgane  Naveau M.D.   On: 09/09/2023 20:13   DG Knee Complete 4 Views Left Result Date: 09/09/2023 EXAM: 4 VIEW(S) XRAY OF THE LEFT KNEE  09/09/2023 02:17:21 PM COMPARISON: None available. CLINICAL HISTORY: Pain, swelling. Pt to ED for left knee swelling x3 days and left wrist swelling. FINDINGS: BONES AND JOINTS: No acute fracture. No focal osseous lesion. No joint dislocation. Medial tibiofemoral joint space narrowing. Mild tricompartmental peripheral spurring. Small to moderate joint effusion. SOFT TISSUES: Suspected varicosities in the subcutaneous tissues medially. Mild soft tissue edema. IMPRESSION: 1. Small to moderate joint effusion. 2. Mild soft tissue edema and suspected varicosities in the subcutaneous tissues medially. 3. Tricompartmental osteoarthritis. Electronically signed by: Andrea Gasman MD 09/09/2023 02:56 PM EDT RP Workstation: [id number]   DG Wrist Complete Left Result Date: 09/09/2023 EXAM: 3 or more VIEW(S) XRAY OF THE  LEFT WRIST 09/09/2023 02:17:21 PM COMPARISON: None available. CLINICAL HISTORY: Pain, swelling. Pt to ED for left knee swelling x3 days and left wrist swelling. FINDINGS: BONES AND JOINTS: No acute fracture. No erosions or bony destruction. Minimal radial styloid spurring. SOFT TISSUES: Slight soft tissue edema. No soft tissue gas or radiopaque foreign body. IMPRESSION: 1. No acute osseous abnormality. 2. Minimal degenerative change and soft tissue edema. Electronically signed by: Andrea Gasman MD 09/09/2023 02:55 PM EDT RP Workstation: [id number]    Positive ROS: All other systems have been reviewed and were otherwise negative with the exception of those mentioned in the HPI and as above.  Physical Exam: General:  Alert, no acute distress Psychiatric:  Patient is competent for consent with normal mood and affect   Cardiovascular:  No pedal edema Respiratory:  No wheezing, non-labored breathing GI:  Abdomen is soft and non-tender Skin:  No lesions in the area of chief complaint Neurologic:  Sensation intact distally Lymphatic:  No axillary or cervical lymphadenopathy  Orthopedic Exam:  Left  lower extremity Skin intact over the knee significant warmth noted to the knee compared to contralateral side minimal erythema Mild effusion of the left knee Severe pain with any motion of the left knee including micromotion Only able to range 0-45 due to pain Neurovascular intact distally no bony tenderness to palpation able dorsiflex and plantarflex the foot and toes with intact dorsalis pedis pulse  Left upper extremity exam Mild erythema over the proximal humeral area and some mild warmth no significant cellulitic changes No pain to palpation over the distal humerus elbow, forearm fingers or wrist except for over the base of the first CMC Positive grind test of the first CMC No pain with micromotion of the thumb IP joint or CMC joint AIN/PIN/M/U/R intact Compartments all soft Intact radial pulse  X-rays:  X-rays of the left hand and left knee images reviewed myself.  The left knee shows significant degenerative changes with lateral joint space narrowing osteophyte formation and sclerosis consistent with arthritis.  Moderate effusion.  No evidence of bony erosions or osteomyelitis.  Left hand shows mild generative changes including at the base of the [facility] of the first digit.  Mild midcarpal arthritic changes no fractures or dislocations noted in the knee or the wrist and hand.  Left knee aspiration 09/09/2023 Test Result Released: Yes (not seen)   0 Result Notes    Component Ref Range & Units (hover) 1 d ago  Color, Synovial YELLOW Abnormal   Appearance-Synovial TURBID Abnormal   Crystals, Fluid NO CRYSTALS SEEN  WBC, Synovial 75,505 High   Neutrophil, Synovial 71  Lymphocytes-Synovial Fld 23  Monocyte-Macrophage-Synovial Fluid 6  Eosinophils-Synovial 0  Comment: Performed at [facility], [address] [place], [place] [address]  Resulting Agency [facility]        Specimen Collected: 09/09/23 16:20 Last Resulted: 09/09/23 20:08       Assessment: Left knee  septic arthritis, left thumb sprain versus arthritis  Plan: I reviewed the clinical findings with the patient.  Her left knee aspiration is concerning for cell count above 50,000 cells which would be consistent with a diagnosis of septic arthritis of the left knee.  We discussed the treatment options including nonsurgical and surgical interventions.  Given the presence of a significant amount of white cells in the knee and her physical exam I do think she likely has a septic arthritis of the left knee at this time and would benefit from a washout to  help get source control and reduce long-term consequences of the knee infection.  We did discuss the risk of nonoperative treatment in terms of source control and worsening infection.  We did discuss the possibility that she may need repeat washouts to get control of the infection.  Will plan for arthroscopic washout today and discussed the risk of bleeding/bruising/persistent infection/need for additional surgery, damage to surrounding nerves, vessels or other surrounding structures.  We did discuss placement of a drain after surgery with plan to remove the drain in the next day or 2.  We did discuss that she will likely need extended IV and then oral antibiotics and we would discuss this with infectious disease.  After reviewing all the surgical risks benefits and alternatives the patient agrees with the plan to move forward with left knee arthroscopic irrigation and debridement today.  Consent signed was formed and is at bedside all questions answered.    Arthea Sheer MD  Beeper #:  [phone]  09/10/2023 10:52 AM

## 2023-09-10 NOTE — Op Note (Signed)
 Patient Name: Laurielle Groesbeck  FMW:[id number]  Pre-Operative Diagnosis: Left knee septic arthritis  Post-Operative Diagnosis: (same)  Procedure: Left knee arthroscopic irrigation and debridement with synovectomy and drain placement  Arthroscopic findings: Purulent cloudy synovial fluid, grade 3 changes to the patellofemoral cartilage grade 2 changes in the medial compartment grade 3 changes in the lateral compartment, free edge tearing of the medial meniscus, extensive synovitis  Components/Implants: None  Date of Surgery: 09/10/2023  Surgeon: Arthea Sheer MD  Assistant: None   Anesthesiologist: Myra  Anesthesia: General   Tourniquet Time: 0 min  EBL: 5 cc   Complications: None   Brief history: The patient is a 78 year old female presented to the [facility] emergency room with several days of painful swelling with fevers chills warmth and decreased motion of her left knee.  ER aspiration was positive for a white cell count over 75,000 cells and given her physical exam there was significant discern for ongoing septic arthritis in the left knee.  After reviewing the risks and benefits of surgical versus conservative treatment with the patient and her family decision was made to proceed with left knee arthroscopic irrigation debridement with drain placement..  All preoperative imaging was reviewed and an appropriate surgical plan was made prior to surgery.    Description of procedure: The patient was brought to the operating room where laterality was confirmed by all those present to be the left side.   General anesthesia was administered and the patient received an intravenous dose of antibiotics for surgical prophylaxis.  Patient is positioned supine on the operating room table with all bony prominences well-padded.  A well-padded tourniquet was applied to the left thigh with the patient supine on the table with a lateral post.  The knee was then prepped and  draped in usual sterile fashion with multiple layers of adhesive and nonadhesive drapes.  All of those present in the operating room participated in a surgical timeout laterality and patient were confirmed.    A standard lateral arthroscopic portal was obtained utilizing spinal needle and an 11 blade.  Upon placing the camera trocar into the knee there was a gush of very cloudy nearly purulent synovial fluid which was captured in a sterile cup and sent for culture.  The arthroscopic camera was introduced into the knee and the knee was insufflated with saline with dilute vancomycin .  A diagnostic arthroscopy was then performed starting with the patellofemoral joint and inspecting both the lateral and medial gutters.  There was extensive synovitis throughout all of the gutters and in the pouch as well as in the medial lateral joint spaces.  Attention was then turned to the medial compartment and a second medial portal was established under direct visualization with a 18-gauge spinal needle with care taken to avoid any damage to the medial cartilage or medial meniscus.  There was noted to be free edge tearing of the medial meniscus with extensive synovitis in the notch with an intact ACL and cartilage damage in all 3 compartments most significantly in the lateral compartment and patellofemoral compartment.  A third superior lateral portal was established under direct visualization as an outflow portal.  Dannielle was then used to perform a synovectomy in all 3 compartments removing inflamed synovium and to do a complete debridement around the knee.  9 L of antibiotic saline were washed through the knee.  Fluid with come out completely clear at the end.  A drain was then placed through the superior lateral portal. The inferior portals  were closed with 3-0 nylon interrupted portal stitches.  The knee was wrapped with sterile 4 x 4's, ABDs, and Ace wrap.  Sponge, needle, and Lap counts were all correct at the end of  the case.   The patient was transferred off of the operating room table to a hospital bed, good pulses were found distally on the operative side.  The patient was transferred to the recovery room in stable condition.

## 2023-09-10 NOTE — Anesthesia Procedure Notes (Signed)
 Procedure Name: LMA Insertion Date/Time: 09/10/2023 12:13 PM  Performed by: Vannie Smaller, CRNAPre-anesthesia Checklist: Patient identified, Patient being monitored, Timeout performed, Emergency Drugs available and Suction available Patient Re-evaluated:Patient Re-evaluated prior to induction Oxygen Delivery Method: Circle system utilized Preoxygenation: Pre-oxygenation with 100% oxygen Induction Type: IV induction Ventilation: Mask ventilation without difficulty LMA: LMA inserted Tube type: Oral Number of attempts: 4 Placement Confirmation: positive ETCO2 and breath sounds checked- equal and bilateral Tube secured with: Tape Dental Injury: Teeth and Oropharynx as per pre-operative assessment

## 2023-09-10 NOTE — Anesthesia Preprocedure Evaluation (Signed)
 Anesthesia Evaluation  Patient identified by MRN, date of birth, ID band Patient awake    Reviewed: Allergy & Precautions, H&P , NPO status , Patient's Chart, lab work & pertinent test results, reviewed documented beta blocker date and time   Airway Mallampati: III  TM Distance: >3 FB Neck ROM: full    Dental  (+) Teeth Intact   Pulmonary neg pulmonary ROS   Pulmonary exam normal        Cardiovascular Exercise Tolerance: Poor hypertension, On Medications + DOE  Normal cardiovascular exam+ Valvular Problems/Murmurs  Rate:Normal     Neuro/Psych negative neurological ROS  negative psych ROS   GI/Hepatic negative GI ROS, Neg liver ROS,,,  Endo/Other  Hypothyroidism    Renal/GU Renal disease  negative genitourinary   Musculoskeletal   Abdominal   Peds  Hematology negative hematology ROS (+)   Anesthesia Other Findings   Reproductive/Obstetrics negative OB ROS                              Anesthesia Physical Anesthesia Plan  ASA: 3 and emergent  Anesthesia Plan: General LMA   Post-op Pain Management:    Induction:   PONV Risk Score and Plan: 4 or greater  Airway Management Planned:   Additional Equipment:   Intra-op Plan:   Post-operative Plan:   Informed Consent: I have reviewed the patients History and Physical, chart, labs and discussed the procedure including the risks, benefits and alternatives for the proposed anesthesia with the patient or authorized representative who has indicated his/her understanding and acceptance.       Plan Discussed with: CRNA  Anesthesia Plan Comments:         Anesthesia Quick Evaluation

## 2023-09-10 NOTE — Assessment & Plan Note (Signed)
-   With continue statin therapy.

## 2023-09-10 NOTE — Assessment & Plan Note (Signed)
 Continue Synthroid 

## 2023-09-10 NOTE — Progress Notes (Signed)
 Pharmacy Antibiotic Note  Rachel Quinn is a 78 y.o. female admitted on 09/09/2023 with suspected septic arthritis.  PMH significant for essential hypertension, hypothyroidism, dyslipidemia and chronic kidney disease. Pharmacy has been consulted for Vancomycin  dosing.  - 8/23:   Plan: - Will give Vancomycin  loading dose of 1750 mg x 1  - Will start maintenance dose at 1000 mg q24h (eAUC 497.7, Scr 0.85, IBW used, Vd 0.72) - Goal AUC 400-550 - Will continue to monitor renal function   Pharmacy will continue to follow and will adjust abx dosing whenever warranted.   Height: 5' (152.4 cm) Weight: 73.5 kg (162 lb 0.6 oz) IBW/kg (Calculated) : 45.5  Temp (24hrs), Avg:98.5 F (36.9 C), Min:97.8 F (36.6 C), Max:99.2 F (37.3 C)  Recent Labs  Lab 09/09/23 1513 09/10/23 0555  WBC 11.0* 11.7*  CREATININE 0.89 0.85    Estimated Creatinine Clearance: 50.4 mL/min (by C-G formula based on SCr of 0.85 mg/dL).    No Known Allergies  Antimicrobials this admission: Vancomycin  8/25  >>    Dose adjustments this admission:   Microbiology results: 8/23 Synovial Fluid Cx: pending 8/23 Resp panel: Covid positive   Thank you for allowing pharmacy to be a part of this patient's care.   Ransom Blanch PGY-1 Pharmacy Resident  [place] [facility]  09/10/2023 8:52 AM

## 2023-09-11 ENCOUNTER — Encounter: Payer: Self-pay | Admitting: Orthopedic Surgery

## 2023-09-11 DIAGNOSIS — E871 Hypo-osmolality and hyponatremia: Secondary | ICD-10-CM | POA: Diagnosis not present

## 2023-09-11 NOTE — Progress Notes (Signed)
   Subjective: 1 Day Post-Op Procedure(s) (LRB): ARTHROSCOPY, KNEE (Left) Patient reports pain as mild.  Left knee pain much improved following procedure. Patient is well, and has had no acute complaints or problems Denies any CP, SOB, ABD pain. We will continue therapy today.  Plan is to go Home after hospital stay.  Objective: Vital signs in last 24 hours: Temp:  [97 F (36.1 C)-98.9 F (37.2 C)] 97.6 F (36.4 C) (08/25 0754) Pulse Rate:  [48-69] 56 (08/25 0754) Resp:  [16-19] 17 (08/25 0754) BP: (114-148)/(55-76) 148/69 (08/25 0754) SpO2:  [97 %-100 %] 98 % (08/25 0754)  Intake/Output from previous day: 08/24 0701 - 08/25 0700 In: 1487.6 [I.V.:1487.6] Out: 1100 [Urine:1100] Intake/Output this shift: No intake/output data recorded.  Recent Labs    09/09/23 1513 09/10/23 0555  HGB 13.5 11.4*   Recent Labs    09/09/23 1513 09/10/23 0555  WBC 11.0* 11.7*  RBC 4.40 3.74*  HCT 41.1 32.9*  PLT 104* 175   Recent Labs    09/09/23 1513 09/10/23 0555  NA 128* 131*  K 3.1* 4.2  CL 92* 101  CO2 22 23  BUN 13 15  CREATININE 0.89 0.85  GLUCOSE 124* 139*  CALCIUM  7.9* 7.0*   No results for input(s): LABPT, INR in the last 72 hours.  EXAM General - Patient is Alert, Appropriate, and Oriented Extremity - Neurovascular intact Sensation intact distally Intact pulses distally Dorsiflexion/Plantar flexion intact Dressing - dressing C/D/I and no drainage, hemovac removed.  No drainage within Hemovac canister Motor Function - intact, moving foot and toes well on exam.   Past Medical History:  Diagnosis Date   Cataract    Chronic kidney disease    Heart murmur    Hypertension    Kidney stone    Thyroid  disease     Assessment/Plan:   1 Day Post-Op Procedure(s) (LRB): ARTHROSCOPY, KNEE (Left) Principal Problem:   Hyponatremia Active Problems:   Acute pain of left knee   COVID-19 virus infection   Dyslipidemia   Hypothyroidism   Essential  hypertension   Hypokalemia   Pyogenic arthritis of left knee joint (HCC)  Estimated body mass index is 31.65 kg/m as calculated from the following:   Height as of this encounter: 5' (1.524 m).   Weight as of this encounter: 73.5 kg. Advance diet Up with therapy Pain well-controlled, much improved Continue with IV antibiotics.  Cultures/sensitivities pending Labs and vital signs are stable Hemovac removed.   DVT Prophylaxis - Lovenox , TED hose, and SCDs Weight-Bearing as tolerated to left leg   T. Medford Amber, PA-C [facility] 09/11/2023, 10:03 AM

## 2023-09-11 NOTE — Plan of Care (Signed)
  Problem: Education: Goal: Knowledge of risk factors and measures for prevention of condition will improve Outcome: Progressing   Problem: Coping: Goal: Psychosocial and spiritual needs will be supported Outcome: Progressing   Problem: Respiratory: Goal: Will maintain a patent airway Outcome: Progressing Goal: Complications related to the disease process, condition or treatment will be avoided or minimized Outcome: Progressing   Problem: Education: Goal: Knowledge of General Education information will improve Description: Including pain rating scale, medication(s)/side effects and non-pharmacologic comfort measures Outcome: Progressing   Problem: Clinical Measurements: Goal: Ability to maintain clinical measurements within normal limits will improve Outcome: Progressing Goal: Will remain free from infection Outcome: Progressing Goal: Diagnostic test results will improve Outcome: Progressing Goal: Respiratory complications will improve Outcome: Progressing Goal: Cardiovascular complication will be avoided Outcome: Progressing   Problem: Activity: Goal: Risk for activity intolerance will decrease Outcome: Progressing   Problem: Nutrition: Goal: Adequate nutrition will be maintained Outcome: Progressing   Problem: Coping: Goal: Level of anxiety will decrease Outcome: Progressing   Problem: Elimination: Goal: Will not experience complications related to bowel motility Outcome: Progressing Goal: Will not experience complications related to urinary retention Outcome: Progressing   Problem: Pain Managment: Goal: General experience of comfort will improve and/or be controlled Outcome: Progressing   Problem: Safety: Goal: Ability to remain free from injury will improve Outcome: Progressing   Problem: Skin Integrity: Goal: Risk for impaired skin integrity will decrease Outcome: Progressing
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
## 2023-09-11 NOTE — Progress Notes (Signed)
 PROGRESS NOTE    Rachel Quinn  FMW:[id number] DOB: 1945-04-16 DOA: 09/09/2023 PCP: Wendee Lynwood HERO, NP    Brief Narrative:   78 y.o. female with medical history significant for essential hypertension, hypothyroidism, dyslipidemia and chronic kidney disease, who presented to the emergency room with acute onset of left knee pain which has been worsening today.  She has been having fever and chills a couple of days ago.  She admitted to diminished appetite as well as nausea and vomiting without diarrhea.  She has been having dry cough and occasional wheezing with no significant dyspnea.  No dysuria, oliguria or hematuria or flank pain.  No diarrhea or melena or bright red bleeding per rectum.  No other bleeding diathesis.   Assessment & Plan:   Principal Problem:   Hyponatremia Active Problems:   Acute pain of left knee   COVID-19 virus infection   Hypokalemia   Dyslipidemia   Hypothyroidism   Essential hypertension   Pyogenic arthritis of left knee joint (HCC)  Septic arthritis left knee Synovial fluid analysis with white blood cell count 75,000.   Plan: Status post surgical washout on 8/24.  Tolerated procedure well.  Pain improved.  Cultures pending.  Continue empiric vancomycin .  Follow-up fluid studies.  Possible discharge in 24 hours  * Hyponatremia Mild and improved.  Off IV fluids     COVID-19 virus infection Asymptomatic.  I suspect the fever was driven by the septic arthritis.  No hypoxia and no infiltrate.  Continue airborne and contact precautions     Hypokalemia Monitor and replace as necessary   Essential hypertension Continue current regimen   Hypothyroidism Continue Synthroid    Dyslipidemia Continue statin     DVT prophylaxis: Lovenox  Code Status: Full Family Communication: Spouse at bedside 8/24 Disposition Plan: Status is: Inpatient Remains inpatient appropriate because: Septic arthritis left knee   Level of care: Telemetry  Medical  Consultants:  Orthopedics  Procedures:  Left knee incision and drainage  Antimicrobials: Vancomycin    Subjective: Seen and examined.  Sitting up in chair.  Pain improved.  No complaints  Objective: Vitals:   09/11/23 0523 09/11/23 0754 09/11/23 1114 09/11/23 1459  BP: 134/76 (!) 148/69 132/66 128/80  Pulse: (!) 53 (!) 56 67 (!) 56  Resp: 16 17 17 17   Temp: 97.8 F (36.6 C) 97.6 F (36.4 C) 98.5 F (36.9 C) 98.4 F (36.9 C)  TempSrc: Oral Oral Oral Oral  SpO2: 98% 98% 99% 100%  Weight:      Height:        Intake/Output Summary (Last 24 hours) at 09/11/2023 1515 Last data filed at 09/11/2023 1252 Gross per 24 hour  Intake 887.64 ml  Output 2050 ml  Net -1162.36 ml   Filed Weights   09/09/23 1310 09/09/23 2250  Weight: 73 kg 73.5 kg    Examination:  General exam: NAD Respiratory system: Clear.  Normal work of breathing.  Room air Cardiovascular system: S1 S2, RRR, no murmurs, no pedal edema Gastrointestinal system: Soft, NT/ND, normal bowel sounds Central nervous system: Alert and oriented. No focal neurological deficits. Extremities: Left knee swollen, tender to touch, decreased ROM Skin: No rashes, lesions or ulcers Psychiatry: Judgement and insight appear normal. Mood & affect appropriate.     Data Reviewed: I have personally reviewed following labs and imaging studies  CBC: Recent Labs  Lab 09/09/23 1513 09/10/23 0555  WBC 11.0* 11.7*  HGB 13.5 11.4*  HCT 41.1 32.9*  MCV 93.4 88.0  PLT 104* 175  Basic Metabolic Panel: Recent Labs  Lab 09/09/23 1513 09/10/23 0555  NA 128* 131*  K 3.1* 4.2  CL 92* 101  CO2 22 23  GLUCOSE 124* 139*  BUN 13 15  CREATININE 0.89 0.85  CALCIUM  7.9* 7.0*  MG 1.9  --    GFR: Estimated Creatinine Clearance: 50.4 mL/min (by C-G formula based on SCr of 0.85 mg/dL). Liver Function Tests: No results for input(s): AST, ALT, ALKPHOS, BILITOT, PROT, ALBUMIN in the last 168 hours. No results  for input(s): LIPASE, AMYLASE in the last 168 hours. No results for input(s): AMMONIA in the last 168 hours. Coagulation Profile: No results for input(s): INR, PROTIME in the last 168 hours. Cardiac Enzymes: No results for input(s): CKTOTAL, CKMB, CKMBINDEX, TROPONINI in the last 168 hours. BNP (last 3 results) No results for input(s): PROBNP in the last 8760 hours. HbA1C: No results for input(s): HGBA1C in the last 72 hours. CBG: No results for input(s): GLUCAP in the last 168 hours. Lipid Profile: No results for input(s): CHOL, HDL, LDLCALC, TRIG, CHOLHDL, LDLDIRECT in the last 72 hours. Thyroid  Function Tests: No results for input(s): TSH, T4TOTAL, FREET4, T3FREE, THYROIDAB in the last 72 hours. Anemia Panel: No results for input(s): VITAMINB12, FOLATE, FERRITIN, TIBC, IRON, RETICCTPCT in the last 72 hours. Sepsis Labs: Recent Labs  Lab 09/10/23 0555  PROCALCITON 0.10    Recent Results (from the past 240 hours)  Body fluid culture w Gram Stain     Status: None (Preliminary result)   Collection Time: 09/09/23  6:52 PM   Specimen: Synovial, Left Knee; Body Fluid  Result Value Ref Range Status   Specimen Description   Final    Synov, Knee Performed at [facility], [address] [place], [place] [address]    Special Requests   Final    NONE Performed at [facility], [address] [place], [place] [address]    Gram Stain   Final    WBC PRESENT,BOTH PMN AND MONONUCLEAR NO ORGANISMS SEEN CYTOSPIN SMEAR    Culture   Final    NO GROWTH 1 DAY Performed at [facility], [address], [place], [place] [address]    Report Status PENDING  Incomplete  Resp panel by RT-PCR (RSV, Flu A&B, Covid) Anterior Nasal Swab     Status: Abnormal   Collection Time: 09/09/23  7:29 PM   Specimen: Anterior Nasal Swab  Result Value Ref Range Status   SARS Coronavirus 2 by RT PCR POSITIVE (A) NEGATIVE  Final    Comment: (NOTE) SARS-CoV-2 target nucleic acids are DETECTED.  The SARS-CoV-2 RNA is generally detectable in upper respiratory specimens during the acute phase of infection. Positive results are indicative of the presence of the identified virus, but do not rule out bacterial infection or co-infection with other pathogens not detected by the test. Clinical correlation with patient history and other diagnostic information is necessary to determine patient infection status. The expected result is Negative.  Fact Sheet for Patients: BloggerCourse.com  Fact Sheet for Healthcare Providers: SeriousBroker.it  This test is not yet approved or cleared by the [place]  FDA and  has been authorized for detection and/or diagnosis of SARS-CoV-2 by FDA under an Emergency Use Authorization (EUA).  This EUA will remain in effect (meaning this test can be used) for the duration of  the COVID-19 declaration under Section 564(b)(1) of the A ct, 21 U.S.C. section 360bbb-3(b)(1), unless the authorization is terminated or revoked sooner.  Influenza A by PCR NEGATIVE NEGATIVE Final   Influenza B by PCR NEGATIVE NEGATIVE Final    Comment: (NOTE) The Xpert Xpress SARS-CoV-2/FLU/RSV plus assay is intended as an aid in the diagnosis of influenza from Nasopharyngeal swab specimens and should not be used as a sole basis for treatment. Nasal washings and aspirates are unacceptable for Xpert Xpress SARS-CoV-2/FLU/RSV testing.  Fact Sheet for Patients: BloggerCourse.com  Fact Sheet for Healthcare Providers: SeriousBroker.it  This test is not yet approved or cleared by the [place]  FDA and has been authorized for detection and/or diagnosis of SARS-CoV-2 by FDA under an Emergency Use Authorization (EUA). This EUA will remain in effect (meaning this test can be used) for the duration of  the COVID-19 declaration under Section 564(b)(1) of the Act, 21 U.S.C. section 360bbb-3(b)(1), unless the authorization is terminated or revoked.     Resp Syncytial Virus by PCR NEGATIVE NEGATIVE Final    Comment: (NOTE) Fact Sheet for Patients: BloggerCourse.com  Fact Sheet for Healthcare Providers: SeriousBroker.it  This test is not yet approved or cleared by the [place]  FDA and has been authorized for detection and/or diagnosis of SARS-CoV-2 by FDA under an Emergency Use Authorization (EUA). This EUA will remain in effect (meaning this test can be used) for the duration of the COVID-19 declaration under Section 564(b)(1) of the Act, 21 U.S.C. section 360bbb-3(b)(1), unless the authorization is terminated or revoked.  Performed at [facility], [address] [place], [place] [address]   Body fluid culture w Gram Stain     Status: None (Preliminary result)   Collection Time: 09/10/23 12:33 PM   Specimen: Synovial, Left Knee; Body Fluid  Result Value Ref Range Status   Specimen Description   Final    SYNOVIAL Performed at [facility], [address] [place], [place] [address]    Special Requests   Final    SYN LEFT KNEE ID A Performed at [facility], [address] [place], [place] [address]    Gram Stain   Final    RARE WBC PRESENT, PREDOMINANTLY PMN NO ORGANISMS SEEN    Culture   Final    NO GROWTH < 12 HOURS Performed at [facility], [address] [place], [place] [address]    Report Status PENDING  Incomplete  Body fluid culture w Gram Stain     Status: None (Preliminary result)   Collection Time: 09/10/23 12:39 PM   Specimen: Synovial, Left Knee; Body Fluid  Result Value Ref Range Status   Specimen Description   Final    SYNOVIAL Performed at [facility], [address] [place], [place] [address]    Special Requests   Final    SYN LEFT KNEE ID  B Performed at [facility], [address] [place], [place] [address]    Gram Stain   Final    RARE WBC PRESENT, PREDOMINANTLY PMN NO ORGANISMS SEEN    Culture   Final    NO GROWTH < 12 HOURS Performed at [facility], [address] [place], [place] [address]    Report Status PENDING  Incomplete         Radiology Studies: DG Chest 1 View Result Date: 09/09/2023 CLINICAL DATA:  wheezing EXAM: CHEST  1 VIEW COMPARISON:  None Available. FINDINGS: Patient is rotated. Widened mediastinum. Otherwise the heart and mediastinal contours are within normal limits. No focal consolidation. No pulmonary edema. No pleural effusion. No pneumothorax. No acute  osseous abnormality. IMPRESSION: Widened mediastinum which is likely due to AP portable technique and patient rotation. Recommend repeat PA and lateral view of the chest for further evaluation. Electronically Signed   By: Morgane  Naveau M.D.   On: 09/09/2023 20:13        Scheduled Meds:  atenolol   50 mg Oral Daily   atorvastatin   10 mg Oral Daily   calcium  carbonate  1 tablet Oral BID WC   chlorthalidone   25 mg Oral Daily   docusate sodium   100 mg Oral BID   enoxaparin  (LOVENOX ) injection  40 mg Subcutaneous Q24H   levothyroxine   75 mcg Oral Daily   Continuous Infusions:  vancomycin  1,000 mg (09/11/23 1014)     LOS: 2 days     Calvin KATHEE Robson, MD [facility]   If 7PM-7AM, please contact night-coverage  09/11/2023, 3:15 PM
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
## 2023-09-12 ENCOUNTER — Other Ambulatory Visit: Payer: Self-pay | Admitting: Family Medicine

## 2023-09-12 ENCOUNTER — Other Ambulatory Visit: Payer: Self-pay | Admitting: Nurse Practitioner

## 2023-09-12 DIAGNOSIS — E871 Hypo-osmolality and hyponatremia: Secondary | ICD-10-CM | POA: Diagnosis not present

## 2023-09-12 DIAGNOSIS — E890 Postprocedural hypothyroidism: Secondary | ICD-10-CM

## 2023-09-12 LAB — BASIC METABOLIC PANEL WITH GFR
Anion gap: 10 (ref 5–15)
BUN: 20 mg/dL (ref 8–23)
CO2: 25 mmol/L (ref 22–32)
Calcium: 7.6 mg/dL — ABNORMAL LOW (ref 8.9–10.3)
Chloride: 104 mmol/L (ref 98–111)
Creatinine, Ser: 0.82 mg/dL (ref 0.44–1.00)
GFR, Estimated: >60 mL/min (ref >60)
Glucose, Bld: 87 mg/dL (ref 70–99)
Potassium: 3.7 mmol/L (ref 3.5–5.1)
Sodium: 137 mmol/L (ref 135–145)

## 2023-09-12 LAB — CBC WITH DIFFERENTIAL/PLATELET
Abs Immature Granulocytes: 0.05 K/uL (ref 0.00–0.07)
Basophils Absolute: 0 K/uL (ref 0.0–0.1)
Basophils Relative: 0 %
Eosinophils Absolute: 0.1 K/uL (ref 0.0–0.5)
Eosinophils Relative: 1 %
HCT: 36.9 % (ref 36.0–46.0)
Hemoglobin: 12.6 g/dL (ref 12.0–15.0)
Immature Granulocytes: 0 %
Lymphocytes Relative: 21 %
Lymphs Abs: 2.4 K/uL (ref 0.7–4.0)
MCH: 30.7 pg (ref 26.0–34.0)
MCHC: 34.1 g/dL (ref 30.0–36.0)
MCV: 90 fL (ref 80.0–100.0)
Monocytes Absolute: 0.7 K/uL (ref 0.1–1.0)
Monocytes Relative: 6 %
Neutro Abs: 8.3 K/uL — ABNORMAL HIGH (ref 1.7–7.7)
Neutrophils Relative %: 72 %
Platelets: 201 K/uL (ref 150–400)
RBC: 4.1 MIL/uL (ref 3.87–5.11)
RDW: 14.1 % (ref 11.5–15.5)
WBC: 11.5 K/uL — ABNORMAL HIGH (ref 4.0–10.5)
nRBC: 0 % (ref 0.0–0.2)

## 2023-09-12 MED ORDER — AMOXICILLIN-POT CLAVULANATE 875-125 MG PO TABS: 1.0000 | ORAL_TABLET | Freq: Two times a day (BID) | ORAL | 0 refills | Status: AC

## 2023-09-12 MED ORDER — OXYCODONE HCL 5 MG PO TABS: 5.0000 mg | ORAL_TABLET | ORAL | 0 refills | Status: DC | PRN

## 2023-09-12 MED ORDER — VANCOMYCIN HCL 1000 MG/200ML IV SOLN
1000.0000 mg | INTRAVENOUS | Status: DC
  Filled 2023-09-12: qty 200

## 2023-09-12 NOTE — Progress Notes (Signed)
   Subjective: 2 Days Post-Op Procedure(s) (LRB): ARTHROSCOPY, KNEE (Left) Patient reports pain as mild.   Patient is well, and has had no acute complaints or problems Denies any CP, SOB, ABD pain. We will continue therapy today.  Plan is to go Home after hospital stay.  Objective: Vital signs in last 24 hours: Temp:  [98.4 F (36.9 C)-98.6 F (37 C)] 98.6 F (37 C) (08/26 0747) Pulse Rate:  [56-58] 58 (08/26 0747) Resp:  [16-18] 16 (08/26 0747) BP: (128-148)/(65-80) 148/74 (08/26 0747) SpO2:  [97 %-100 %] 98 % (08/26 0747)  Intake/Output from previous day: 08/25 0701 - 08/26 0700 In: 250 [IV Piggyback:250] Out: 3050 [Urine:3050] Intake/Output this shift: Total I/O In: -  Out: 625 [Urine:625]  Recent Labs    09/09/23 1513 09/10/23 0555 09/12/23 0837  HGB 13.5 11.4* 12.6   Recent Labs    09/10/23 0555 09/12/23 0837  WBC 11.7* 11.5*  RBC 3.74* 4.10  HCT 32.9* 36.9  PLT 175 201   Recent Labs    09/10/23 0555 09/12/23 0837  NA 131* 137  K 4.2 3.7  CL 101 104  CO2 23 25  BUN 15 20  CREATININE 0.85 0.82  GLUCOSE 139* 87  CALCIUM  7.0* 7.6*   No results for input(s): LABPT, INR in the last 72 hours.  EXAM General - Patient is Alert, Appropriate, and Oriented Extremity - Neurovascular intact Sensation intact distally Intact pulses distally Dorsiflexion/Plantar flexion intact Dressing - dressing C/D/I and no drainage,  Motor Function - intact, moving foot and toes well on exam.   Past Medical History:  Diagnosis Date   Cataract    Chronic kidney disease    Heart murmur    Hypertension    Kidney stone    Thyroid  disease     Assessment/Plan:   2 Days Post-Op Procedure(s) (LRB): ARTHROSCOPY, KNEE (Left) Principal Problem:   Hyponatremia Active Problems:   Acute pain of left knee   COVID-19 virus infection   Dyslipidemia   Hypothyroidism   Essential hypertension   Hypokalemia   Pyogenic arthritis of left knee joint (HCC)  Estimated  body mass index is 31.65 kg/m as calculated from the following:   Height as of this encounter: 5' (1.524 m).   Weight as of this encounter: 73.5 kg. Advance diet Up with therapy Pain well-controlled, much improved Synovial fluid cultures negative.  Will plan on treating with 4 to 6 weeks of empiric oral antibiotics.  Patient will need to keep incision sites clean dry and covered.  She will need to follow-up with [facility] 2 weeks postop for suture removal   DVT Prophylaxis - Lovenox , TED hose, and SCDs Weight-Bearing as tolerated to left leg   T. Medford Amber, PA-C [facility] 09/12/2023, 12:24 PM

## 2023-09-12 NOTE — Progress Notes (Signed)
 Occupational Therapy Treatment Patient Details Name: Rachel Quinn MRN: [id number] DOB: 1945-03-18 Today's Date: 09/12/2023   History of present illness Pt is a 78 y.o. female who presented to the emergency room yesterday after several days of worsening left knee pain and swelling. Admitted for management of L knee septic arthritis, hyponatremia and Covid 19. She underwent L knee arthroscopic irrigation and debridement with synovectomy and drain placement on 8/24. PMH of essential hypertension, hypothyroidism, dyslipidemia and chronic kidney disease   OT comments  Pt is supine in bed on arrival and agreeable to OT session. Reports 3/10 L knee pain. She engaged in ADL session this date standing at sink and/or seated at EOB. Supervision required for all tasks including bed mobility, UB/LB bathing and dressing tasks and ~60 ft of ambulation within the room using RW. Pt was left seated on toilet at end of session with mobility tech/aide Princella present for handoff. Pt is making great progress towards OT goals and will continue to benefit from acute OT services to maximize her safety and IND to return to PLOF.      If plan is discharge home, recommend the following:  A little help with walking and/or transfers;A little help with bathing/dressing/bathroom;Assistance with cooking/housework;Help with stairs or ramp for entrance;Assist for transportation   Equipment Recommendations  Other (comment);BSC/3in1 (RW)    Recommendations for Other Services      Precautions / Restrictions Precautions Precautions: Fall Recall of Precautions/Restrictions: Intact Restrictions Weight Bearing Restrictions Per Provider Order: Yes LUE Weight Bearing Per Provider Order: Weight bearing as tolerated LLE Weight Bearing Per Provider Order: Weight bearing as tolerated Other Position/Activity Restrictions: L UE WBAT (splint as needed) per discussion with PA Charlene 09/11/23; WBAT L LE       Mobility Bed  Mobility Overal bed mobility: Needs Assistance Bed Mobility: Supine to Sit     Supine to sit: Supervision, Used rails     General bed mobility comments: increased effort but no physical assist need from supine    Transfers Overall transfer level: Modified independent Equipment used: Rolling walker (2 wheels) Transfers: Sit to/from Stand Sit to Stand: Modified independent (Device/Increase time)           General transfer comment: stood at sink for all ADLs with supervision and no AD use, ambulated ~60 ft in room with RW and supervision     Balance Overall balance assessment: Needs assistance Sitting-balance support: No upper extremity supported, Feet supported Sitting balance-Leahy Scale: Good     Standing balance support: Bilateral upper extremity supported, During functional activity, Reliant on assistive device for balance Standing balance-Leahy Scale: Good                             ADL either performed or assessed with clinical judgement   ADL Overall ADL's : Needs assistance/impaired         Upper Body Bathing: Supervision/ safety;Standing   Lower Body Bathing: Supervison/ safety;Sitting/lateral leans;Sit to/from stand   Upper Body Dressing : Sitting;Set up       Toilet Transfer: Supervision/safety;Comfort height toilet;Rolling walker (2 wheels)   Toileting- Clothing Manipulation and Hygiene: Supervision/safety       Functional mobility during ADLs: Rolling walker (2 wheels);Supervision/safety      Extremity/Trunk Assessment              Diplomatic Services operational officer  Cognition Arousal: Alert Behavior During Therapy: WFL for tasks assessed/performed                                 Following commands: Intact        Cueing   Cueing Techniques: Verbal cues  Exercises      Shoulder Instructions       General Comments      Pertinent Vitals/ Pain       Pain  Assessment Pain Assessment: 0-10 Pain Score: 3  Pain Location: L knee Pain Descriptors / Indicators: Aching Pain Intervention(s): Limited activity within patient's tolerance, Monitored during session, Repositioned  Home Living                                          Prior Functioning/Environment              Frequency  Min 2X/week        Progress Toward Goals  OT Goals(current goals can now be found in the care plan section)  Progress towards OT goals: Progressing toward goals  Acute Rehab OT Goals Patient Stated Goal: go home OT Goal Formulation: With patient Time For Goal Achievement: 09/25/23 Potential to Achieve Goals: Good  Plan      Co-evaluation                 AM-PAC OT 6 Clicks Daily Activity     Outcome Measure   Help from another person eating meals?: None Help from another person taking care of personal grooming?: None Help from another person toileting, which includes using toliet, bedpan, or urinal?: A Little Help from another person bathing (including washing, rinsing, drying)?: A Little Help from another person to put on and taking off regular upper body clothing?: None Help from another person to put on and taking off regular lower body clothing?: A Little 6 Click Score: 21    End of Session Equipment Utilized During Treatment: Gait belt;Rolling walker (2 wheels)  OT Visit Diagnosis: Unsteadiness on feet (R26.81);Other abnormalities of gait and mobility (R26.89);Muscle weakness (generalized) (M62.81)   Activity Tolerance Patient tolerated treatment well   Patient Left  (handoff to mobility tech Dora)   Nurse Communication Mobility status        Time: [phone] OT Time Calculation (min): 30 min  Charges: OT General Charges $OT Visit: 1 Visit OT Treatments $Self Care/Home Management : 23-37 mins  Rachel Quinn, OTR/L  09/12/23, 5:16 PM   Rachel Quinn 09/12/2023, 5:14 PM

## 2023-09-12 NOTE — Discharge Summary (Signed)
 Physician Discharge Summary  Rachel Quinn FMW:[id number] DOB: 03-May-1945 DOA: 09/09/2023  PCP: Wendee Lynwood HERO, NP  Admit date: 09/09/2023 Discharge date: 09/12/2023  Admitted From: Home  Disposition:  Home with home health  Recommendations for Outpatient Follow-up:  Follow up with PCP in 1-2 weeks Follow up ortho 2 weeks  Home Health:Yes PT OT  Equipment/Devices:None   Discharge Condition:Stable  CODE STATUS:FULL  Diet recommendation: Reg  Brief/Interim Summary:  78 y.o. female with medical history significant for essential hypertension, hypothyroidism, dyslipidemia and chronic kidney disease, who presented to the emergency room with acute onset of left knee pain which has been worsening today.  She has been having fever and chills a couple of days ago.  She admitted to diminished appetite as well as nausea and vomiting without diarrhea.  She has been having dry cough and occasional wheezing with no significant dyspnea.  No dysuria, oliguria or hematuria or flank pain.  No diarrhea or melena or bright red bleeding per rectum.  No other bleeding diathesis.     Discharge Diagnoses:  Principal Problem:   Hyponatremia Active Problems:   Acute pain of left knee   COVID-19 virus infection   Hypokalemia   Dyslipidemia   Hypothyroidism   Essential hypertension   Pyogenic arthritis of left knee joint (HCC) Septic arthritis left knee Synovial fluid analysis with white blood cell count 75,000.   Plan: Status post surgical washout on 8/24.  Tolerated procedure well.  Pain improved.  Cultures not growing anything as of this day.  There is no evidence of MRSA.  As such we will de-escalate to p.o. Augmentin  875 mg twice daily.  Complete total 4-week course from discharge.  Patient will follow-up with orthopedic surgery in 2 weeks in clinic.   COVID-19 virus infection Asymptomatic.  I suspect the fever was driven by the septic arthritis.  No hypoxia and no infiltrate.      Discharge Instructions  Discharge Instructions     Diet - low sodium heart healthy   Complete by: As directed    Increase activity slowly   Complete by: As directed    No wound care   Complete by: As directed       Allergies as of 09/12/2023   No Known Allergies      Medication List     TAKE these medications    albuterol  108 (90 Base) MCG/ACT inhaler Commonly known as: VENTOLIN  HFA Inhale 2 puffs into the lungs every 6 (six) hours as needed for wheezing or shortness of breath (Cough).   amoxicillin -clavulanate 875-125 MG tablet Commonly known as: AUGMENTIN  Take 1 tablet by mouth 2 (two) times daily.   atenolol -chlorthalidone  50-25 MG tablet Commonly known as: TENORETIC  Take 1 tablet by mouth once daily   atorvastatin  10 MG tablet Commonly known as: LIPITOR Take 1 tablet by mouth once daily   calcium  carbonate 1250 (500 Ca) MG tablet Commonly known as: OS-CAL - dosed in mg of elemental calcium  Take 1 tablet by mouth.   levothyroxine  75 MCG tablet Commonly known as: SYNTHROID  Take 1 tablet (75 mcg total) by mouth daily.   oxyCODONE  5 MG immediate release tablet Commonly known as: Oxy IR/ROXICODONE  Take 1 tablet (5 mg total) by mouth every 4 (four) hours as needed for severe pain (pain score 7-10).   potassium citrate  10 MEQ (1080 MG) SR tablet Commonly known as: UROCIT-K  Take 1 tablet (10 mEq total) by mouth daily.        Follow-up Information  [name], PA-C Follow up in 14 day(s).   Specialties: Orthopedic Surgery, Emergency Medicine Contact information: [address] [place] [address] [phone]         Wendee Lynwood HERO, NP. Schedule an appointment as soon as possible for a visit in 1 week(s).   Specialties: Nurse Practitioner, Family Medicine Contact information: [address] [place] [address] [phone]                No Known  Allergies  Consultations: Orthopedics   Procedures/Studies: DG Chest 1 View Result Date: 09/09/2023 CLINICAL DATA:  wheezing EXAM: CHEST  1 VIEW COMPARISON:  None Available. FINDINGS: Patient is rotated. Widened mediastinum. Otherwise the heart and mediastinal contours are within normal limits. No focal consolidation. No pulmonary edema. No pleural effusion. No pneumothorax. No acute osseous abnormality. IMPRESSION: Widened mediastinum which is likely due to AP portable technique and patient rotation. Recommend repeat PA and lateral view of the chest for further evaluation. Electronically Signed   By: Morgane  Naveau M.D.   On: 09/09/2023 20:13   DG Knee Complete 4 Views Left Result Date: 09/09/2023 EXAM: 4 VIEW(S) XRAY OF THE LEFT KNEE 09/09/2023 02:17:21 PM COMPARISON: None available. CLINICAL HISTORY: Pain, swelling. Pt to ED for left knee swelling x3 days and left wrist swelling. FINDINGS: BONES AND JOINTS: No acute fracture. No focal osseous lesion. No joint dislocation. Medial tibiofemoral joint space narrowing. Mild tricompartmental peripheral spurring. Small to moderate joint effusion. SOFT TISSUES: Suspected varicosities in the subcutaneous tissues medially. Mild soft tissue edema. IMPRESSION: 1. Small to moderate joint effusion. 2. Mild soft tissue edema and suspected varicosities in the subcutaneous tissues medially. 3. Tricompartmental osteoarthritis. Electronically signed by: Andrea Gasman MD 09/09/2023 02:56 PM EDT RP Workstation: [id number]   DG Wrist Complete Left Result Date: 09/09/2023 EXAM: 3 or more VIEW(S) XRAY OF THE LEFT WRIST 09/09/2023 02:17:21 PM COMPARISON: None available. CLINICAL HISTORY: Pain, swelling. Pt to ED for left knee swelling x3 days and left wrist swelling. FINDINGS: BONES AND JOINTS: No acute fracture. No erosions or bony destruction. Minimal radial styloid spurring. SOFT TISSUES: Slight soft tissue edema. No soft tissue gas or radiopaque foreign body.  IMPRESSION: 1. No acute osseous abnormality. 2. Minimal degenerative change and soft tissue edema. Electronically signed by: Andrea Gasman MD 09/09/2023 02:55 PM EDT RP Workstation: [id number]      Subjective: Seen and examined on the day of discharge.  Stable no distress.  Appropriate discharge home.  Discharge Exam: Vitals:   09/12/23 0355 09/12/23 0747  BP: 138/67 (!) 148/74  Pulse: (!) 58 (!) 58  Resp: 16 16  Temp: 98.4 F (36.9 C) 98.6 F (37 C)  SpO2: 97% 98%   Vitals:   09/11/23 1459 09/11/23 2117 09/12/23 0355 09/12/23 0747  BP: 128/80 132/65 138/67 (!) 148/74  Pulse: (!) 56 (!) 58 (!) 58 (!) 58  Resp: 17 18 16 16   Temp: 98.4 F (36.9 C) 98.4 F (36.9 C) 98.4 F (36.9 C) 98.6 F (37 C)  TempSrc: Oral Oral Oral Oral  SpO2: 100% 100% 97% 98%  Weight:      Height:        General: Pt is alert, awake, not in acute distress Cardiovascular: RRR, S1/S2 +, no rubs, no gallops Respiratory: CTA bilaterally, no wheezing, no rhonchi Abdominal: Soft, NT, ND, bowel sounds + Extremities: no edema, no cyanosis    The results of significant diagnostics from this hospitalization (including imaging, microbiology, ancillary and laboratory) are  listed below for reference.     Microbiology: Recent Results (from the past 240 hours)  Body fluid culture w Gram Stain     Status: None (Preliminary result)   Collection Time: 09/09/23  6:52 PM   Specimen: Synovial, Left Knee; Body Fluid  Result Value Ref Range Status   Specimen Description   Final    Synov, Knee Performed at [facility], [address] [place], [place] [address]    Special Requests   Final    NONE Performed at [facility], [address] [place], [place] [address]    Gram Stain   Final    WBC PRESENT,BOTH PMN AND MONONUCLEAR NO ORGANISMS SEEN CYTOSPIN SMEAR    Culture   Final    NO GROWTH 2 DAYS Performed at [facility], [address] [place], [place] [address]     Report Status PENDING  Incomplete  Resp panel by RT-PCR (RSV, Flu A&B, Covid) Anterior Nasal Swab     Status: Abnormal   Collection Time: 09/09/23  7:29 PM   Specimen: Anterior Nasal Swab  Result Value Ref Range Status   SARS Coronavirus 2 by RT PCR POSITIVE (A) NEGATIVE Final    Comment: (NOTE) SARS-CoV-2 target nucleic acids are DETECTED.  The SARS-CoV-2 RNA is generally detectable in upper respiratory specimens during the acute phase of infection. Positive results are indicative of the presence of the identified virus, but do not rule out bacterial infection or co-infection with other pathogens not detected by the test. Clinical correlation with patient history and other diagnostic information is necessary to determine patient infection status. The expected result is Negative.  Fact Sheet for Patients: BloggerCourse.com  Fact Sheet for Healthcare Providers: SeriousBroker.it  This test is not yet approved or cleared by the [place]  FDA and  has been authorized for detection and/or diagnosis of SARS-CoV-2 by FDA under an Emergency Use Authorization (EUA).  This EUA will remain in effect (meaning this test can be used) for the duration of  the COVID-19 declaration under Section 564(b)(1) of the A ct, 21 U.S.C. section 360bbb-3(b)(1), unless the authorization is terminated or revoked sooner.     Influenza A by PCR NEGATIVE NEGATIVE Final   Influenza B by PCR NEGATIVE NEGATIVE Final    Comment: (NOTE) The Xpert Xpress SARS-CoV-2/FLU/RSV plus assay is intended as an aid in the diagnosis of influenza from Nasopharyngeal swab specimens and should not be used as a sole basis for treatment. Nasal washings and aspirates are unacceptable for Xpert Xpress SARS-CoV-2/FLU/RSV testing.  Fact Sheet for Patients: BloggerCourse.com  Fact Sheet for Healthcare  Providers: SeriousBroker.it  This test is not yet approved or cleared by the [place]  FDA and has been authorized for detection and/or diagnosis of SARS-CoV-2 by FDA under an Emergency Use Authorization (EUA). This EUA will remain in effect (meaning this test can be used) for the duration of the COVID-19 declaration under Section 564(b)(1) of the Act, 21 U.S.C. section 360bbb-3(b)(1), unless the authorization is terminated or revoked.     Resp Syncytial Virus by PCR NEGATIVE NEGATIVE Final    Comment: (NOTE) Fact Sheet for Patients: BloggerCourse.com  Fact Sheet for Healthcare Providers: SeriousBroker.it  This test is not yet approved or cleared by the [place]  FDA and has been authorized for detection and/or diagnosis of SARS-CoV-2 by FDA under an Emergency Use Authorization (EUA). This EUA will remain in effect (meaning this test can be used) for the duration of the COVID-19 declaration  under Section 564(b)(1) of the Act, 21 U.S.C. section 360bbb-3(b)(1), unless the authorization is terminated or revoked.  Performed at [facility], [address] [place], [place] [address]   Body fluid culture w Gram Stain     Status: None (Preliminary result)   Collection Time: 09/10/23 12:33 PM   Specimen: Synovial, Left Knee; Body Fluid  Result Value Ref Range Status   Specimen Description   Final    SYNOVIAL Performed at [facility], [address], [place], [place] [address]    Special Requests   Final    SYN LEFT KNEE ID A Performed at [facility], [address] [place], [place] [address]    Gram Stain   Final    RARE WBC PRESENT, PREDOMINANTLY PMN NO ORGANISMS SEEN    Culture   Final    NO GROWTH 2 DAYS Performed at [facility], [address] [place], [place] [address]    Report Status PENDING  Incomplete  Body fluid culture w Gram Stain      Status: None (Preliminary result)   Collection Time: 09/10/23 12:39 PM   Specimen: Synovial, Left Knee; Body Fluid  Result Value Ref Range Status   Specimen Description   Final    SYNOVIAL Performed at [facility], [address] [place], [place] [address]    Special Requests   Final    SYN LEFT KNEE ID B Performed at [facility], [address] [place], [place] [address]    Gram Stain   Final    RARE WBC PRESENT, PREDOMINANTLY PMN NO ORGANISMS SEEN    Culture   Final    NO GROWTH 2 DAYS Performed at [facility], [address] [place], [place] [address]    Report Status PENDING  Incomplete     Labs: BNP (last 3 results) No results for input(s): BNP in the last 8760 hours. Basic Metabolic Panel: Recent Labs  Lab 09/09/23 1513 09/10/23 0555 09/12/23 0837  NA 128* 131* 137  K 3.1* 4.2 3.7  CL 92* 101 104  CO2 22 23 25   GLUCOSE 124* 139* 87  BUN 13 15 20   CREATININE 0.89 0.85 0.82  CALCIUM  7.9* 7.0* 7.6*  MG 1.9  --   --    Liver Function Tests: No results for input(s): AST, ALT, ALKPHOS, BILITOT, PROT, ALBUMIN in the last 168 hours. No results for input(s): LIPASE, AMYLASE in the last 168 hours. No results for input(s): AMMONIA in the last 168 hours. CBC: Recent Labs  Lab 09/09/23 1513 09/10/23 0555 09/12/23 0837  WBC 11.0* 11.7* 11.5*  NEUTROABS  --   --  8.3*  HGB 13.5 11.4* 12.6  HCT 41.1 32.9* 36.9  MCV 93.4 88.0 90.0  PLT 104* 175 201   Cardiac Enzymes: No results for input(s): CKTOTAL, CKMB, CKMBINDEX, TROPONINI in the last 168 hours. BNP: Invalid input(s): POCBNP CBG: No results for input(s): GLUCAP in the last 168 hours. D-Dimer No results for input(s): DDIMER in the last 72 hours. Hgb A1c No results for input(s): HGBA1C in the last 72 hours. Lipid Profile No results for input(s): CHOL, HDL, LDLCALC, TRIG, CHOLHDL, LDLDIRECT in the last 72 hours. Thyroid   function studies No results for input(s): TSH, T4TOTAL, T3FREE, THYROIDAB in the last 72 hours.  Invalid input(s): FREET3 Anemia work up No results for input(s): VITAMINB12, FOLATE, FERRITIN, TIBC, IRON, RETICCTPCT in the last 72 hours. Urinalysis No results found for: COLORURINE, APPEARANCEUR, LABSPEC,  PHURINE, [place], HGBUR, BILIRUBINUR, KETONESUR, PROTEINUR, UROBILINOGEN, NITRITE, LEUKOCYTESUR Sepsis Labs Recent Labs  Lab 09/09/23 1513 09/10/23 0555 09/12/23 0837  WBC 11.0* 11.7* 11.5*   Microbiology Recent Results (from the past 240 hours)  Body fluid culture w Gram Stain     Status: None (Preliminary result)   Collection Time: 09/09/23  6:52 PM   Specimen: Synovial, Left Knee; Body Fluid  Result Value Ref Range Status   Specimen Description   Final    Synov, Knee Performed at [facility], [address] [place], [place] [address]    Special Requests   Final    NONE Performed at [facility], [address] [place], [place] [address]    Gram Stain   Final    WBC PRESENT,BOTH PMN AND MONONUCLEAR NO ORGANISMS SEEN CYTOSPIN SMEAR    Culture   Final    NO GROWTH 2 DAYS Performed at [facility], [address], [place], [place] [address]    Report Status PENDING  Incomplete  Resp panel by RT-PCR (RSV, Flu A&B, Covid) Anterior Nasal Swab     Status: Abnormal   Collection Time: 09/09/23  7:29 PM   Specimen: Anterior Nasal Swab  Result Value Ref Range Status   SARS Coronavirus 2 by RT PCR POSITIVE (A) NEGATIVE Final    Comment: (NOTE) SARS-CoV-2 target nucleic acids are DETECTED.  The SARS-CoV-2 RNA is generally detectable in upper respiratory specimens during the acute phase of infection. Positive results are indicative of the presence of the identified virus, but do not rule out bacterial infection or co-infection with other pathogens not detected by the test. Clinical correlation with  patient history and other diagnostic information is necessary to determine patient infection status. The expected result is Negative.  Fact Sheet for Patients: BloggerCourse.com  Fact Sheet for Healthcare Providers: SeriousBroker.it  This test is not yet approved or cleared by the [place]  FDA and  has been authorized for detection and/or diagnosis of SARS-CoV-2 by FDA under an Emergency Use Authorization (EUA).  This EUA will remain in effect (meaning this test can be used) for the duration of  the COVID-19 declaration under Section 564(b)(1) of the A ct, 21 U.S.C. section 360bbb-3(b)(1), unless the authorization is terminated or revoked sooner.     Influenza A by PCR NEGATIVE NEGATIVE Final   Influenza B by PCR NEGATIVE NEGATIVE Final    Comment: (NOTE) The Xpert Xpress SARS-CoV-2/FLU/RSV plus assay is intended as an aid in the diagnosis of influenza from Nasopharyngeal swab specimens and should not be used as a sole basis for treatment. Nasal washings and aspirates are unacceptable for Xpert Xpress SARS-CoV-2/FLU/RSV testing.  Fact Sheet for Patients: BloggerCourse.com  Fact Sheet for Healthcare Providers: SeriousBroker.it  This test is not yet approved or cleared by the [place]  FDA and has been authorized for detection and/or diagnosis of SARS-CoV-2 by FDA under an Emergency Use Authorization (EUA). This EUA will remain in effect (meaning this test can be used) for the duration of the COVID-19 declaration under Section 564(b)(1) of the Act, 21 U.S.C. section 360bbb-3(b)(1), unless the authorization is terminated or revoked.     Resp Syncytial Virus by PCR NEGATIVE NEGATIVE Final    Comment: (NOTE) Fact Sheet for Patients: BloggerCourse.com  Fact Sheet for Healthcare Providers: SeriousBroker.it  This test is  not yet approved or cleared by the [place]  FDA and has been authorized for detection and/or diagnosis of SARS-CoV-2 by FDA under an Emergency Use Authorization (  EUA). This EUA will remain in effect (meaning this test can be used) for the duration of the COVID-19 declaration under Section 564(b)(1) of the Act, 21 U.S.C. section 360bbb-3(b)(1), unless the authorization is terminated or revoked.  Performed at [facility], [address] [place], [place] [address]   Body fluid culture w Gram Stain     Status: None (Preliminary result)   Collection Time: 09/10/23 12:33 PM   Specimen: Synovial, Left Knee; Body Fluid  Result Value Ref Range Status   Specimen Description   Final    SYNOVIAL Performed at [facility], [address] [place], [place] [address]    Special Requests   Final    SYN LEFT KNEE ID A Performed at [facility], [address] [place], [place] [address]    Gram Stain   Final    RARE WBC PRESENT, PREDOMINANTLY PMN NO ORGANISMS SEEN    Culture   Final    NO GROWTH 2 DAYS Performed at [facility], [address], [place], [place] [address]    Report Status PENDING  Incomplete  Body fluid culture w Gram Stain     Status: None (Preliminary result)   Collection Time: 09/10/23 12:39 PM   Specimen: Synovial, Left Knee; Body Fluid  Result Value Ref Range Status   Specimen Description   Final    SYNOVIAL Performed at [facility], [address] [place], [place] [address]    Special Requests   Final    SYN LEFT KNEE ID B Performed at [facility], [address] [place], [place] [address]    Gram Stain   Final    RARE WBC PRESENT, PREDOMINANTLY PMN NO ORGANISMS SEEN    Culture   Final    NO GROWTH 2 DAYS Performed at [facility], [address] [place], [place] [address]    Report Status PENDING  Incomplete     Time coordinating discharge: 40 minutes  SIGNED:   Calvin KATHEE Robson, MD  [facility] 09/12/2023, 1:20 PM Pager   If 7PM-7AM, please contact night-coverage

## 2023-09-12 NOTE — Plan of Care (Signed)

## 2023-09-12 NOTE — Progress Notes (Signed)
 Physical Therapy Treatment Patient Details Name: Rachel Quinn MRN: [id number] DOB: [birth year] Today's Date: 09/12/2023   History of Present Illness Pt is a 78 y.o. female who presented to the emergency room yesterday after several days of worsening left knee pain and swelling. Admitted for management of L knee septic arthritis, hyponatremia and Covid 19. She underwent L knee arthroscopic irrigation and debridement with synovectomy and drain placement on 8/24. PMH of essential hypertension, hypothyroidism, dyslipidemia and chronic kidney disease    PT Comments  Pt seen for PT tx with pt agreeable, spouse in room. Pt reporting L knee pain has improved but still slightly achy. Pt is able to ambulate increased distances in hallway with RW & supervision, variable step-to to step-through gait pattern. Pt also requires cuing to roll vs pick up walker. Pt progressing well with mobility, MD notified she may be able to d/c home today.    If plan is discharge home, recommend the following: A little help with walking and/or transfers;A little help with bathing/dressing/bathroom;Assistance with cooking/housework;Assist for transportation;Help with stairs or ramp for entrance   Can travel by private vehicle        Equipment Recommendations  Rolling walker (2 wheels);BSC/3in1 (youth)    Recommendations for Other Services       Precautions / Restrictions Precautions Precautions: Fall Recall of Precautions/Restrictions: Intact Restrictions Weight Bearing Restrictions Per Provider Order: Yes LUE Weight Bearing Per Provider Order: Weight bearing as tolerated LLE Weight Bearing Per Provider Order: Weight bearing as tolerated Other Position/Activity Restrictions: L UE WBAT (splint as needed) per discussion with PA Charlene 09/11/23; WBAT L LE     Mobility  Bed Mobility               General bed mobility comments: not tested, pt received & left sitting in standard chair in room     Transfers Overall transfer level: Modified independent Equipment used: Rolling walker (2 wheels) Transfers: Sit to/from Stand Sit to Stand: Modified independent (Device/Increase time)                Ambulation/Gait Ambulation/Gait assistance: Supervision Gait Distance (Feet): 125 Feet Assistive device: Rolling walker (2 wheels)   Gait velocity: decreased     General Gait Details: varying from step-to to step-through pattern, max cuing to roll RW vs picking it up to advance it   Stairs             Wheelchair Mobility     Tilt Bed    Modified Rankin (Stroke Patients Only)       Balance Overall balance assessment: Needs assistance Sitting-balance support: No upper extremity supported, Feet supported Sitting balance-Leahy Scale: Good     Standing balance support: Bilateral upper extremity supported, During functional activity, Reliant on assistive device for balance Standing balance-Leahy Scale: Good                              Communication    Cognition Arousal: Alert Behavior During Therapy: WFL for tasks assessed/performed   PT - Cognitive impairments: No apparent impairments                         Following commands: Intact      Cueing Cueing Techniques: Verbal cues  Exercises      General Comments General comments (skin integrity, edema, etc.): politely declines exercises      Pertinent Vitals/Pain Pain Assessment Pain Assessment: Faces  Faces Pain Scale: Hurts a little bit Pain Location: L knee Pain Descriptors / Indicators: Aching Pain Intervention(s): Monitored during session, Limited activity within patient's tolerance    Home Living                          Prior Function            PT Goals (current goals can now be found in the care plan section) Acute Rehab PT Goals Patient Stated Goal: to improve pain and walking PT Goal Formulation: With patient Time For Goal Achievement:  09/25/23 Potential to Achieve Goals: Good Progress towards PT goals: Progressing toward goals    Frequency    Min 3X/week      PT Plan      Co-evaluation              AM-PAC PT 6 Clicks Mobility   Outcome Measure  Help needed turning from your back to your side while in a flat bed without using bedrails?: None Help needed moving from lying on your back to sitting on the side of a flat bed without using bedrails?: A Little Help needed moving to and from a bed to a chair (including a wheelchair)?: A Little Help needed standing up from a chair using your arms (e.g., wheelchair or bedside chair)?: None Help needed to walk in hospital room?: A Little Help needed climbing 3-5 steps with a railing? : A Little 6 Click Score: 20    End of Session   Activity Tolerance: Patient tolerated treatment well Patient left: in chair;with call bell/phone within reach;with family/visitor present   PT Visit Diagnosis: Other abnormalities of gait and mobility (R26.89);Muscle weakness (generalized) (M62.81);Pain Pain - Right/Left: Left Pain - part of body: Knee     Time: 8945-8895 PT Time Calculation (min) (ACUTE ONLY): 10 min  Charges:    $Therapeutic Activity: 8-22 mins PT General Charges $$ ACUTE PT VISIT: 1 Visit                     Rachel Quinn, PT, DPT 09/12/23, 11:18 AM   Rachel Quinn 09/12/2023, 11:17 AM

## 2023-09-12 NOTE — TOC Transition Note (Signed)
 Transition of Care [facility]) - Discharge Note   Patient Details  Name: Rachel Quinn MRN: [id number] Date of Birth: 06-19-1945  [facility] CM/SW Contact:  Alvaro Louder, LCSW Phone Number: 09/12/2023, 12:17 PM   Clinical Narrative:   ISRAEL discussed PT recommendation of HH Patient was agreeable. LCSWA reached out to [facility] admissions coordinator an started service for patient. The patient reported that her husband will take her home at Discharge.   TOC signing off    Final next level of care: Home w Home Health Services Barriers to Discharge: No Barriers Identified   Patient Goals and CMS Choice            Discharge Placement                Patient to be transferred to facility by: Husband Name of family member notified: Self Patient and family notified of of transfer: 09/12/23  Discharge Plan and Services Additional resources added to the After Visit Summary for                              [facility] Agency: [facility] Date [facility] Agency Contacted: 09/12/23   Representative spoke with at [facility] Agency: Dorothe  Social Drivers of Health (SDOH) Interventions SDOH Screenings   Food Insecurity: No Food Insecurity (09/10/2023)  Housing: Low Risk  (09/10/2023)  Transportation Needs: No Transportation Needs (09/10/2023)  Utilities: Not At Risk (09/10/2023)  Alcohol Screen: Low Risk  (09/21/2022)  Depression (PHQ2-9): Low Risk  (09/01/2023)  Financial Resource Strain: Low Risk  (09/21/2022)  Physical Activity: Sufficiently Active (09/21/2022)  Social Connections: Socially Integrated (09/10/2023)  Stress: No Stress Concern Present (09/21/2022)  Tobacco Use: Low Risk  (09/10/2023)  Health Literacy: Adequate Health Literacy (09/21/2022)     Readmission Risk Interventions     No data to display

## 2023-09-12 NOTE — Progress Notes (Signed)
 DC instructions given to pt and family. No more questions from the pt. IV removed. [employer] returned. Pt is ready to go out. Pt will be wheeled out by CNA

## 2023-09-13 ENCOUNTER — Telehealth: Payer: Self-pay

## 2023-09-13 LAB — BODY FLUID CULTURE W GRAM STAIN: Culture: NO GROWTH

## 2023-09-13 NOTE — Transitions of Care (Post Inpatient/ED Visit) (Signed)
   09/13/2023  Name: Rachel Quinn MRN: [id number] DOB: [birth year]  Today's TOC FU Call Status: Today's TOC FU Call Status:: Unsuccessful Call (1st Attempt) Unsuccessful Call (1st Attempt) Date: 09/13/23  Attempted to reach the patient regarding the most recent Inpatient/ED visit.  Follow Up Plan: Additional outreach attempts will be made to reach the patient to complete the Transitions of Care (Post Inpatient/ED visit) call.   Arvin Seip RN, BSN, CCM [employer], Population Health Case Manager Phone: [phone]

## 2023-09-13 NOTE — Anesthesia Postprocedure Evaluation (Signed)
 Anesthesia Post Note  Patient: Rachel Quinn  Procedure(s) Performed: ARTHROSCOPY, KNEE (Left: Knee)  Patient location during evaluation: PACU Anesthesia Type: General Level of consciousness: awake and alert Pain management: pain level controlled Vital Signs Assessment: post-procedure vital signs reviewed and stable Respiratory status: spontaneous breathing, nonlabored ventilation, respiratory function stable and patient connected to nasal cannula oxygen Cardiovascular status: blood pressure returned to baseline and stable Postop Assessment: no apparent nausea or vomiting Anesthetic complications: no   No notable events documented.   Last Vitals:  Vitals:   09/12/23 0355 09/12/23 0747  BP: 138/67 (!) 148/74  Pulse: (!) 58 (!) 58  Resp: 16 16  Temp: 36.9 C 37 C  SpO2: 97% 98%    Last Pain:  Vitals:   09/12/23 0818  TempSrc:   PainSc: 0-No pain                 Lynwood KANDICE Clause

## 2023-09-14 ENCOUNTER — Telehealth: Payer: Self-pay

## 2023-09-14 DIAGNOSIS — E785 Hyperlipidemia, unspecified: Secondary | ICD-10-CM | POA: Diagnosis not present

## 2023-09-14 DIAGNOSIS — I129 Hypertensive chronic kidney disease with stage 1 through stage 4 chronic kidney disease, or unspecified chronic kidney disease: Secondary | ICD-10-CM | POA: Diagnosis not present

## 2023-09-14 DIAGNOSIS — U071 COVID-19: Secondary | ICD-10-CM | POA: Diagnosis not present

## 2023-09-14 DIAGNOSIS — N189 Chronic kidney disease, unspecified: Secondary | ICD-10-CM | POA: Diagnosis not present

## 2023-09-14 DIAGNOSIS — Z4789 Encounter for other orthopedic aftercare: Secondary | ICD-10-CM | POA: Diagnosis not present

## 2023-09-14 DIAGNOSIS — M009 Pyogenic arthritis, unspecified: Secondary | ICD-10-CM | POA: Diagnosis not present

## 2023-09-14 DIAGNOSIS — E039 Hypothyroidism, unspecified: Secondary | ICD-10-CM | POA: Diagnosis not present

## 2023-09-14 DIAGNOSIS — E871 Hypo-osmolality and hyponatremia: Secondary | ICD-10-CM | POA: Diagnosis not present

## 2023-09-14 DIAGNOSIS — E876 Hypokalemia: Secondary | ICD-10-CM | POA: Diagnosis not present

## 2023-09-14 LAB — BODY FLUID CULTURE W GRAM STAIN
Culture: NO GROWTH
Culture: NO GROWTH

## 2023-09-14 NOTE — Transitions of Care (Post Inpatient/ED Visit) (Signed)
   09/14/2023  Name: Rachel Quinn MRN: [id number] DOB: 1945-01-22  Today's TOC FU Call Status: Today's TOC FU Call Status:: Unsuccessful Call (2nd Attempt) Unsuccessful Call (2nd Attempt) Date: 09/14/23  Attempted to reach the patient regarding the most recent Inpatient/ED visit.  Follow Up Plan: Additional outreach attempts will be made to reach the patient to complete the Transitions of Care (Post Inpatient/ED visit) call.   Alan Ee, RN, BSN, CEN [employer]- Transition of Care Team.  [facility] [phone]

## 2023-09-15 ENCOUNTER — Telehealth: Payer: Self-pay

## 2023-09-15 NOTE — Transitions of Care (Post Inpatient/ED Visit) (Signed)
   09/15/2023  Name: Rachel Quinn MRN: [id number] DOB: 1945/12/14  Today's TOC FU Call Status: Today's TOC FU Call Status:: Unsuccessful Call (3rd Attempt) Unsuccessful Call (3rd Attempt) Date: 09/15/23  Attempted to reach the patient regarding the most recent Inpatient/ED visit.  Follow Up Plan: No further outreach attempts will be made at this time. We have been unable to contact the patient.  Arvin Seip RN, BSN, CCM [employer], Population Health Case Manager Phone: [phone]

## 2023-09-20 ENCOUNTER — Ambulatory Visit: Admitting: Nurse Practitioner

## 2023-09-20 VITALS — BP 128/52 | HR 63 | Temp 98.0°F | Ht 60.0 in | Wt 163.0 lb

## 2023-09-20 DIAGNOSIS — I129 Hypertensive chronic kidney disease with stage 1 through stage 4 chronic kidney disease, or unspecified chronic kidney disease: Secondary | ICD-10-CM | POA: Diagnosis not present

## 2023-09-20 DIAGNOSIS — E039 Hypothyroidism, unspecified: Secondary | ICD-10-CM | POA: Diagnosis not present

## 2023-09-20 DIAGNOSIS — M009 Pyogenic arthritis, unspecified: Secondary | ICD-10-CM | POA: Diagnosis not present

## 2023-09-20 DIAGNOSIS — Z09 Encounter for follow-up examination after completed treatment for conditions other than malignant neoplasm: Secondary | ICD-10-CM | POA: Insufficient documentation

## 2023-09-20 DIAGNOSIS — M25562 Pain in left knee: Secondary | ICD-10-CM

## 2023-09-20 DIAGNOSIS — Z4789 Encounter for other orthopedic aftercare: Secondary | ICD-10-CM | POA: Diagnosis not present

## 2023-09-20 DIAGNOSIS — N189 Chronic kidney disease, unspecified: Secondary | ICD-10-CM | POA: Diagnosis not present

## 2023-09-20 DIAGNOSIS — E876 Hypokalemia: Secondary | ICD-10-CM | POA: Diagnosis not present

## 2023-09-20 DIAGNOSIS — E871 Hypo-osmolality and hyponatremia: Secondary | ICD-10-CM | POA: Diagnosis not present

## 2023-09-20 DIAGNOSIS — U071 COVID-19: Secondary | ICD-10-CM | POA: Diagnosis not present

## 2023-09-20 DIAGNOSIS — E785 Hyperlipidemia, unspecified: Secondary | ICD-10-CM | POA: Diagnosis not present

## 2023-09-20 LAB — CBC
HCT: 38.6 % (ref 36.0–46.0)
Hemoglobin: 12.9 g/dL (ref 12.0–15.0)
MCHC: 33.3 g/dL (ref 30.0–36.0)
MCV: 91.5 fl (ref 78.0–100.0)
Platelets: 233 K/uL (ref 150.0–400.0)
RBC: 4.22 Mil/uL (ref 3.87–5.11)
RDW: 14.5 % (ref 11.5–15.5)
WBC: 8.8 K/uL (ref 4.0–10.5)

## 2023-09-20 LAB — BASIC METABOLIC PANEL WITH GFR
BUN: 19 mg/dL (ref 6–23)
CO2: 32 meq/L (ref 19–32)
Calcium: 8.4 mg/dL (ref 8.4–10.5)
Chloride: 93 meq/L — ABNORMAL LOW (ref 96–112)
Creatinine, Ser: 0.94 mg/dL (ref 0.40–1.20)
GFR: 58.83 mL/min — ABNORMAL LOW (ref >60.00)
Glucose, Bld: 111 mg/dL — ABNORMAL HIGH (ref 70–99)
Potassium: 3.5 meq/L (ref 3.5–5.1)
Sodium: 133 meq/L — ABNORMAL LOW (ref 135–145)

## 2023-09-20 NOTE — Progress Notes (Signed)
 Acute Office Visit  Subjective:     Patient ID: Rachel Quinn, female    DOB: [birth year], 78 y.o.   MRN: [id number]  Chief Complaint  Patient presents with   Hospitalization Follow-up    Pt complains of pain in left knee only when touched. States she is not doing well.     HPI Patient is in today for hospital follow-up   Patient was seen in urgent care on 09/09/2023 for knee pain.  Patient was given Solu-Medrol  60 mg.  Concern for septic arthritis.  Upon reevaluation she had less range of motion and was sent to emergency department for further evaluation and workup patient was Emergency Department on 09/09/2023 was admitted and discharged on 09/12/2023.  Patient is hopefully having home PT and OT.  Patient was having fever and chills accompanied with the knee pain.  They did draw synovial fluid off which showed a white blood cell count of 75,000 they did a postsurgical washout on 824 that she tolerated well pain improved cultures at that point not growing anything and no evidence of MRSA.  Patient is to complete a 4-week course of Augmentin  875 twice daily and to follow-up with Ortho clinic in 2 weeks.  Patient is here for follow-up Review of Systems  Constitutional:  Negative for chills and fever.  Respiratory:  Negative for shortness of breath.   Cardiovascular:  Positive for leg swelling. Negative for chest pain.  Musculoskeletal:  Positive for joint pain.        Objective:    BP (!) 128/52   Pulse 63   Temp 98 F (36.7 C) (Oral)   Ht 5' (1.524 m)   Wt 163 lb (73.9 kg)   SpO2 99%   BMI 31.83 kg/m    Physical Exam Vitals and nursing note reviewed.  Constitutional:      Appearance: Normal appearance.  Cardiovascular:     Rate and Rhythm: Normal rate and regular rhythm.     Pulses:          Dorsalis pedis pulses are 2+ on the left side.     Heart sounds: Normal heart sounds.  Pulmonary:     Effort: Pulmonary effort is normal.     Breath sounds: Normal  breath sounds.  Neurological:     Mental Status: She is alert.     No results found for any visits on 09/20/23.      Assessment & Plan:   Problem List Items Addressed This Visit       Other   Acute pain of left knee   Improved status post surgical washout after infection suspected.  Patient to continue Augmentin  875 twice daily for the duration of 30 days unless told otherwise by orthopedist.  Patient does have sutures intact wound looks good did redress wound with nonadherent, ABD and then ace wrap.  Signs and symptoms reviewed when to reach out to me orthopedist.  Patient has a follow-up in approximately 1 week with orthopedist keep as scheduled      Hospital discharge follow-up - Primary   Reviewed urgent care note along with ED note and inpatient discharge summary.  Did review imaging and most recent labs Plan to recheck BMP to make sure electrolytes are stable along with CBC to make sure white count is trending down      Relevant Orders   CBC   Basic metabolic panel with GFR    No orders of the defined types were placed in this encounter.  Return in about 5 months (around 02/20/2024), or if symptoms worsen or fail to improve, for BP recheck.  Adina Crandall, NP

## 2023-09-20 NOTE — Assessment & Plan Note (Addendum)
 Reviewed urgent care note along with ED note and inpatient discharge summary.  Did review imaging and most recent labs Plan to recheck BMP to make sure electrolytes are stable along with CBC to make sure white count is trending down

## 2023-09-20 NOTE — Assessment & Plan Note (Signed)
 Improved status post surgical washout after infection suspected.  Patient to continue Augmentin  875 twice daily for the duration of 30 days unless told otherwise by orthopedist.  Patient does have sutures intact wound looks good did redress wound with nonadherent, ABD and then ace wrap.  Signs and symptoms reviewed when to reach out to me orthopedist.  Patient has a follow-up in approximately 1 week with orthopedist keep as scheduled

## 2023-09-20 NOTE — Patient Instructions (Signed)
 Nice to see you today Continue taking the Augmentin  (amoxicillin ) until you finish or told otherwise by the orthopedist  If you get fever, chills, increased pain, numbness/tingling I need to know

## 2023-09-22 ENCOUNTER — Ambulatory Visit: Payer: Self-pay | Admitting: Nurse Practitioner

## 2023-09-22 DIAGNOSIS — U071 COVID-19: Secondary | ICD-10-CM | POA: Diagnosis not present

## 2023-09-22 DIAGNOSIS — Z4789 Encounter for other orthopedic aftercare: Secondary | ICD-10-CM | POA: Diagnosis not present

## 2023-09-22 DIAGNOSIS — E871 Hypo-osmolality and hyponatremia: Secondary | ICD-10-CM | POA: Diagnosis not present

## 2023-09-22 DIAGNOSIS — I129 Hypertensive chronic kidney disease with stage 1 through stage 4 chronic kidney disease, or unspecified chronic kidney disease: Secondary | ICD-10-CM | POA: Diagnosis not present

## 2023-09-22 DIAGNOSIS — E039 Hypothyroidism, unspecified: Secondary | ICD-10-CM | POA: Diagnosis not present

## 2023-09-22 DIAGNOSIS — E785 Hyperlipidemia, unspecified: Secondary | ICD-10-CM | POA: Diagnosis not present

## 2023-09-22 DIAGNOSIS — E876 Hypokalemia: Secondary | ICD-10-CM | POA: Diagnosis not present

## 2023-09-22 DIAGNOSIS — N189 Chronic kidney disease, unspecified: Secondary | ICD-10-CM | POA: Diagnosis not present

## 2023-09-22 DIAGNOSIS — M009 Pyogenic arthritis, unspecified: Secondary | ICD-10-CM | POA: Diagnosis not present

## 2023-09-26 ENCOUNTER — Encounter: Payer: Self-pay | Admitting: Orthopedic Surgery

## 2023-09-27 DIAGNOSIS — M009 Pyogenic arthritis, unspecified: Secondary | ICD-10-CM | POA: Diagnosis not present

## 2023-09-27 DIAGNOSIS — E785 Hyperlipidemia, unspecified: Secondary | ICD-10-CM | POA: Diagnosis not present

## 2023-09-27 DIAGNOSIS — I129 Hypertensive chronic kidney disease with stage 1 through stage 4 chronic kidney disease, or unspecified chronic kidney disease: Secondary | ICD-10-CM | POA: Diagnosis not present

## 2023-09-27 DIAGNOSIS — E871 Hypo-osmolality and hyponatremia: Secondary | ICD-10-CM | POA: Diagnosis not present

## 2023-09-27 DIAGNOSIS — E039 Hypothyroidism, unspecified: Secondary | ICD-10-CM | POA: Diagnosis not present

## 2023-09-27 DIAGNOSIS — Z4789 Encounter for other orthopedic aftercare: Secondary | ICD-10-CM | POA: Diagnosis not present

## 2023-09-27 DIAGNOSIS — U071 COVID-19: Secondary | ICD-10-CM | POA: Diagnosis not present

## 2023-09-27 DIAGNOSIS — N189 Chronic kidney disease, unspecified: Secondary | ICD-10-CM | POA: Diagnosis not present

## 2023-09-27 DIAGNOSIS — E876 Hypokalemia: Secondary | ICD-10-CM | POA: Diagnosis not present

## 2023-09-28 DIAGNOSIS — N189 Chronic kidney disease, unspecified: Secondary | ICD-10-CM | POA: Diagnosis not present

## 2023-09-28 DIAGNOSIS — I129 Hypertensive chronic kidney disease with stage 1 through stage 4 chronic kidney disease, or unspecified chronic kidney disease: Secondary | ICD-10-CM | POA: Diagnosis not present

## 2023-09-28 DIAGNOSIS — U071 COVID-19: Secondary | ICD-10-CM | POA: Diagnosis not present

## 2023-09-28 DIAGNOSIS — E871 Hypo-osmolality and hyponatremia: Secondary | ICD-10-CM | POA: Diagnosis not present

## 2023-09-28 DIAGNOSIS — E785 Hyperlipidemia, unspecified: Secondary | ICD-10-CM | POA: Diagnosis not present

## 2023-09-28 DIAGNOSIS — Z4789 Encounter for other orthopedic aftercare: Secondary | ICD-10-CM | POA: Diagnosis not present

## 2023-09-28 DIAGNOSIS — M009 Pyogenic arthritis, unspecified: Secondary | ICD-10-CM | POA: Diagnosis not present

## 2023-09-28 DIAGNOSIS — E876 Hypokalemia: Secondary | ICD-10-CM | POA: Diagnosis not present

## 2023-09-28 DIAGNOSIS — E039 Hypothyroidism, unspecified: Secondary | ICD-10-CM | POA: Diagnosis not present

## 2023-09-30 ENCOUNTER — Other Ambulatory Visit: Payer: Self-pay | Admitting: Nurse Practitioner

## 2023-09-30 DIAGNOSIS — I10 Essential (primary) hypertension: Secondary | ICD-10-CM

## 2023-09-30 DIAGNOSIS — E876 Hypokalemia: Secondary | ICD-10-CM

## 2023-10-03 DIAGNOSIS — Z4789 Encounter for other orthopedic aftercare: Secondary | ICD-10-CM | POA: Diagnosis not present

## 2023-10-03 DIAGNOSIS — M009 Pyogenic arthritis, unspecified: Secondary | ICD-10-CM | POA: Diagnosis not present

## 2023-10-03 DIAGNOSIS — I129 Hypertensive chronic kidney disease with stage 1 through stage 4 chronic kidney disease, or unspecified chronic kidney disease: Secondary | ICD-10-CM | POA: Diagnosis not present

## 2023-10-03 DIAGNOSIS — E039 Hypothyroidism, unspecified: Secondary | ICD-10-CM | POA: Diagnosis not present

## 2023-10-03 DIAGNOSIS — E876 Hypokalemia: Secondary | ICD-10-CM | POA: Diagnosis not present

## 2023-10-03 DIAGNOSIS — U071 COVID-19: Secondary | ICD-10-CM | POA: Diagnosis not present

## 2023-10-03 DIAGNOSIS — N189 Chronic kidney disease, unspecified: Secondary | ICD-10-CM | POA: Diagnosis not present

## 2023-10-03 DIAGNOSIS — E871 Hypo-osmolality and hyponatremia: Secondary | ICD-10-CM | POA: Diagnosis not present

## 2023-10-03 DIAGNOSIS — E785 Hyperlipidemia, unspecified: Secondary | ICD-10-CM | POA: Diagnosis not present

## 2023-10-04 ENCOUNTER — Ambulatory Visit (INDEPENDENT_AMBULATORY_CARE_PROVIDER_SITE_OTHER)

## 2023-10-04 VITALS — BP 128/76 | Ht 60.0 in | Wt 163.2 lb

## 2023-10-04 DIAGNOSIS — Z0000 Encounter for general adult medical examination without abnormal findings: Secondary | ICD-10-CM

## 2023-10-04 NOTE — Patient Instructions (Signed)
 Rachel Quinn,  Thank you for taking the time for your Medicare Wellness Visit. I appreciate your continued commitment to your health goals. Please review the care plan we discussed, and feel free to reach out if I can assist you further.  Medicare recommends these wellness visits once per year to help you and your care team stay ahead of potential health issues. These visits are designed to focus on prevention, allowing your provider to concentrate on managing your acute and chronic conditions during your regular appointments.  Please note that Annual Wellness Visits do not include a physical exam. Some assessments may be limited, especially if the visit was conducted virtually. If needed, we may recommend a separate in-person follow-up with your provider.  Ongoing Care Seeing your primary care provider every 3 to 6 months helps us  monitor your health and provide consistent, personalized care.   Referrals If a referral was made during today's visit and you haven't received any updates within two weeks, please contact the referred provider directly to check on the status.  Recommended Screenings:  Health Maintenance  Topic Date Due   Zoster (Shingles) Vaccine (1 of 2) Never done   COVID-19 Vaccine (1 - 2024-25 season) Never done   Medicare Annual Wellness Visit  09/21/2023   Flu Shot  11/01/2023*   Colon Cancer Screening  11/18/2025   DTaP/Tdap/Td vaccine (2 - Td or Tdap) 08/17/2032   Pneumococcal Vaccine for age over 53  Completed   DEXA scan (bone density measurement)  Completed   Hepatitis C Screening  Completed   HPV Vaccine  Aged Out   Meningitis B Vaccine  Aged Out  *Topic was postponed. The date shown is not the original due date.       09/10/2023   12:00 AM  Advanced Directives  Does Patient Have a Medical Advance Directive? No  Would patient like information on creating a medical advance directive? No - Patient declined   Advance Care Planning is important because  it: Ensures you receive medical care that aligns with your values, goals, and preferences. Provides guidance to your family and loved ones, reducing the emotional burden of decision-making during critical moments.  Vision: Annual vision screenings are recommended for early detection of glaucoma, cataracts, and diabetic retinopathy. These exams can also reveal signs of chronic conditions such as diabetes and high blood pressure.  Dental: Annual dental screenings help detect early signs of oral cancer, gum disease, and other conditions linked to overall health, including heart disease and diabetes.

## 2023-10-04 NOTE — Progress Notes (Signed)
 Subjective:   Rachel Quinn is a 78 y.o. who presents for a Medicare Wellness preventive visit.  As a reminder, Annual Wellness Visits don't include a physical exam, and some assessments may be limited, especially if this visit is performed virtually. We may recommend an in-person follow-up visit with your provider if needed.  Visit Complete: In person  Persons Participating in Visit: Patient.  AWV Questionnaire: No: Patient Medicare AWV questionnaire was not completed prior to this visit.  Cardiac Risk Factors include: advanced age (>42men, >65 women);dyslipidemia;hypertension;obesity (BMI >30kg/m2)     Objective:    Today's Vitals   10/04/23 1417  BP: 128/76  Weight: 163 lb 3.2 oz (74 kg)  Height: 5' (1.524 m)   Body mass index is 31.87 kg/m.     10/04/2023    2:31 PM 09/10/2023   12:00 AM 09/09/2023    1:11 PM 09/09/2023   10:13 AM 09/21/2022    1:36 PM 06/01/2021   10:34 AM 03/31/2020    3:37 PM  Advanced Directives  Does Patient Have a Medical Advance Directive? No No No No No No No  Would patient like information on creating a medical advance directive?  No - Patient declined  No - Patient declined No - Patient declined No - Patient declined Yes (MAU/Ambulatory/Procedural Areas - Information given)    Current Medications (verified) Outpatient Encounter Medications as of 10/04/2023  Medication Sig   albuterol  (VENTOLIN  HFA) 108 (90 Base) MCG/ACT inhaler INHALE 2 PUFFS BY MOUTH EVERY 6 HOURS AS NEEDED FOR SHORTNESS OF BREATH   amoxicillin -clavulanate (AUGMENTIN ) 875-125 MG tablet Take 1 tablet by mouth 2 (two) times daily.   atenolol -chlorthalidone  (TENORETIC ) 50-25 MG tablet Take 1 tablet by mouth once daily   atorvastatin  (LIPITOR) 10 MG tablet Take 1 tablet by mouth once daily   calcium  carbonate (OS-CAL - DOSED IN MG OF ELEMENTAL CALCIUM ) 1250 (500 Ca) MG tablet Take 1 tablet by mouth.   levothyroxine  (SYNTHROID ) 75 MCG tablet Take 1 tablet (75 mcg total) by  mouth daily.   potassium citrate  (UROCIT-K ) 10 MEQ (1080 MG) SR tablet Take 1 tablet by mouth once daily   oxyCODONE  (OXY IR/ROXICODONE ) 5 MG immediate release tablet Take 1 tablet (5 mg total) by mouth every 4 (four) hours as needed for severe pain (pain score 7-10). (Patient not taking: Reported on 10/04/2023)   No facility-administered encounter medications on file as of 10/04/2023.    Allergies (verified) Patient has no known allergies.   History: Past Medical History:  Diagnosis Date   Cataract    Chronic kidney disease    Heart murmur    Hypertension    Kidney stone    Thyroid  disease    Past Surgical History:  Procedure Laterality Date   CATARACT EXTRACTION Bilateral 01/2020   KIDNEY SURGERY  2000   KNEE ARTHROSCOPY Left 09/10/2023   Procedure: ARTHROSCOPY, KNEE;  Surgeon: Lorelle Hussar, MD;  Location: [facility] ORS;  Service: Orthopedics;  Laterality: Left;   NEPHRECTOMY Left 01/18/1999   2/2 nephrolithiasis   THYROIDECTOMY  01/17/2002   TUBAL LIGATION     Family History  Problem Relation Age of Onset   Healthy Mother    Healthy Father    Cancer Neg Hx    Heart attack Neg Hx    Stroke Neg Hx    Colon cancer Neg Hx    Esophageal cancer Neg Hx    Rectal cancer Neg Hx    Stomach cancer Neg Hx    Social  History   Socioeconomic History   Marital status: Married    Spouse name: Not on file   Number of children: 9   Years of education: college   Highest education level: Not on file  Occupational History   Not on file  Tobacco Use   Smoking status: Never   Smokeless tobacco: Never  Vaping Use   Vaping status: Never Used  Substance and Sexual Activity   Alcohol use: No    Alcohol/week: 0.0 standard drinks of alcohol   Drug use: No   Sexual activity: Not Currently  Other Topics Concern   Not on file  Social History Narrative   Lives with son Elene) and his family   [place] ago   Enjoys: spending time with family,  church Sunday, 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   Exercise: taking care of her granddaughter (41 years old), walking daily   Diet: rice, fish, chicken, veggies, fruit   Social Drivers of Health   Financial Resource Strain: Low Risk  (10/04/2023)   Overall Financial Resource Strain (CARDIA)    Difficulty of Paying Living Expenses: Not hard at all  Food Insecurity: No Food Insecurity (10/04/2023)   Hunger Vital Sign    Worried About Running Out of Food in the Last Year: Never true    Ran Out of Food in the Last Year: Never true  Transportation Needs: No Transportation Needs (10/04/2023)   PRAPARE - Administrator, Civil Service (Medical): No    Lack of Transportation (Non-Medical): No  Physical Activity: Sufficiently Active (10/04/2023)   Exercise Vital Sign    Days of Exercise per Week: 6 days    Minutes of Exercise per Session: 30 min  Stress: No Stress Concern Present (10/04/2023)   [employer] of Occupational Health - Occupational Stress Questionnaire    Feeling of Stress: Not at all  Social Connections: Socially Integrated (10/04/2023)   Social Connection and Isolation Panel    Frequency of Communication with Friends and Family: More than three times a week    Frequency of Social Gatherings with Friends and Family: More than three times a week    Attends Religious Services: More than 4 times per year    Active Member of [employer] or Organizations: Yes    Attends Banker Meetings: 1 to 4 times per year    Marital Status: Married    Tobacco Counseling Counseling given: Not Answered  Clinical Intake:  Pre-visit preparation completed: Yes  Pain : No/denies pain    BMI - recorded: 31.87 Nutritional Status: BMI > 30  Obese Nutritional Risks: None Diabetes: No  No results found for: HGBA1C   How often do you need to have someone help you when you read instructions, pamphlets, or other written materials from your doctor or pharmacy?: 1 -  Never  Interpreter Needed?: No  Comments: lives with husband Information entered by :: B.Shaheen Star,LPN   Activities of Daily Living     10/04/2023    2:32 PM 09/10/2023   12:00 AM  In your present state of health, do you have any difficulty performing the following activities:  Hearing? 0 0  Vision? 0 0  Difficulty concentrating or making decisions? 0 0  Walking or climbing stairs? 0   Dressing or bathing? 0   Doing errands, shopping? 0   Preparing Food and eating ? N   Using the Toilet? N   In the past six months, have  you accidently leaked urine? N   Do you have problems with loss of bowel control? N   Managing your Medications? N   Managing your Finances? N   Housekeeping or managing your Housekeeping? N     Patient Care Team: Wendee Lynwood HERO, NP as PCP - General (Nurse Practitioner) Lavonia Lye, MD as Consulting Physician (Ophthalmology)  I have updated your Care Teams any recent Medical Services you may have received from other providers in the past year.     Assessment:   This is a routine wellness examination for Rachel Quinn.  Hearing/Vision screen Hearing Screening - Comments:: Patient denies any hearing difficulties.   Vision Screening - Comments:: Pt says their vision is good with glasses for reading Dr  Lavonia   Goals Addressed             This Visit's Progress    Exercise 3x per week (30 min per time)   On track    10/04/23-Hoping to walk 20 minutes per day     Patient Stated   On track    Continue Current lifestyle     Patient Stated   On track    10/04/23-Maintain current health status       Depression Screen     10/04/2023    2:26 PM 09/01/2023    9:49 AM 03/02/2023    3:45 PM 09/21/2022    1:30 PM 08/31/2022    2:05 PM 06/20/2022   10:42 AM 06/01/2021   10:42 AM  PHQ 2/9 Scores  PHQ - 2 Score 0 0 0 0 0 0 0  PHQ- 9 Score  0 0 0 0 0     Fall Risk     10/04/2023    2:23 PM 09/01/2023    9:50 AM 03/02/2023    3:45 PM 09/21/2022    1:37 PM  08/31/2022    2:06 PM  Fall Risk   Falls in the past year? 0 0 0 0 0  Number falls in past yr: 0 0 0 0 0  Injury with Fall? 0 0 0 0 0  Risk for fall due to : Impaired mobility;Orthopedic patient No Fall Risks Impaired balance/gait;No Fall Risks No Fall Risks No Fall Risks  Follow up Education provided;Falls prevention discussed Falls evaluation completed Falls evaluation completed Falls prevention discussed;Falls evaluation completed Falls evaluation completed    MEDICARE RISK AT HOME:  Medicare Risk at Home Any stairs in or around the home?: Yes If so, are there any without handrails?: Yes Home free of loose throw rugs in walkways, pet beds, electrical cords, etc?: Yes Adequate lighting in your home to reduce risk of falls?: Yes Life alert?: No Use of a cane, walker or w/c?: Yes Grab bars in the bathroom?: Yes Shower chair or bench in shower?: Yes Elevated toilet seat or a handicapped toilet?: No  TIMED UP AND GO:  Was the test performed?  Yes  Length of time to ambulate 10 feet: 15 sec Gait slow and steady with assistive device  Cognitive Function: 6CIT completed        10/04/2023    2:32 PM 09/21/2022    1:39 PM 06/01/2021   10:37 AM  6CIT Screen  What Year? 0 points 0 points 0 points  What month? 0 points 0 points 0 points  What time? 0 points 0 points 0 points  Count back from 20 0 points 0 points 0 points  Months in reverse 0 points 4 points 0 points  Repeat  phrase 2 points 6 points 6 points  Total Score 2 points 10 points 6 points    Immunizations Immunization History  Administered Date(s) Administered   Fluad Quad(high Dose 65+) 12/22/2019   INFLUENZA, HIGH DOSE SEASONAL PF 11/11/2018   Influenza Split 11/09/2016   Influenza,inj,Quad PF,6+ Mos 09/15/2017   PNEUMOCOCCAL CONJUGATE-20 09/01/2023   Pneumococcal Conjugate-13 09/15/2017   Tdap 08/18/2022    Screening Tests Health Maintenance  Topic Date Due   Zoster Vaccines- Shingrix  (1 of 2) Never done    COVID-19 Vaccine (1 - 2024-25 season) Never done   Influenza Vaccine  11/01/2023 (Originally 08/18/2023)   Medicare Annual Wellness (AWV)  10/03/2024   Colonoscopy  11/18/2025   DTaP/Tdap/Td (2 - Td or Tdap) 08/17/2032   Pneumococcal Vaccine: 50+ Years  Completed   DEXA SCAN  Completed   Hepatitis C Screening  Completed   HPV VACCINES  Aged Out   Meningococcal B Vaccine  Aged Out    Health Maintenance Items Addressed: Vaccinations: PT declines all Influenza vaccine: recommend every Fall Shingles vaccine: recommend Shingrix  which is 2 doses 2-6 months apart and over 90% effective     Covid-19: recommend 2 doses one month apart with a booster 6 months later   Additional Screening:  Vision Screening: Recommended annual ophthalmology exams for early detection of glaucoma and other disorders of the eye. Is the patient up to date with their annual eye exam?  no Who is the provider or what is the name of the office in which the patient attends annual eye exams? Dr Le/Bevis-will call to schedule  Dental Screening: Recommended annual dental exams for proper oral hygiene  Community Resource Referral / Chronic Care Management: CRR required this visit?  No   CCM required this visit?  No   Plan:    I have personally reviewed and noted the following in the patient's chart:   Medical and social history Use of alcohol, tobacco or illicit drugs  Current medications and supplements including opioid prescriptions. Patient is not currently taking opioid prescriptions. Functional ability and status Nutritional status Physical activity Advanced directives List of other physicians Hospitalizations, surgeries, and ER visits in previous 12 months Vitals Screenings to include cognitive, depression, and falls Referrals and appointments  In addition, I have reviewed and discussed with patient certain preventive protocols, quality metrics, and best practice recommendations. A written personalized  care plan for preventive services as well as general preventive health recommendations were provided to patient.   Erminio LITTIE Saris, LPN   [id number]   After Visit Summary: (MyChart) Due to this being a telephonic visit, the after visit summary with patients personalized plan was offered to patient via MyChart   Notes: Nothing significant to report at this time.

## 2023-10-05 DIAGNOSIS — E876 Hypokalemia: Secondary | ICD-10-CM | POA: Diagnosis not present

## 2023-10-05 DIAGNOSIS — E039 Hypothyroidism, unspecified: Secondary | ICD-10-CM | POA: Diagnosis not present

## 2023-10-05 DIAGNOSIS — N189 Chronic kidney disease, unspecified: Secondary | ICD-10-CM | POA: Diagnosis not present

## 2023-10-05 DIAGNOSIS — U071 COVID-19: Secondary | ICD-10-CM | POA: Diagnosis not present

## 2023-10-05 DIAGNOSIS — Z4789 Encounter for other orthopedic aftercare: Secondary | ICD-10-CM | POA: Diagnosis not present

## 2023-10-05 DIAGNOSIS — M009 Pyogenic arthritis, unspecified: Secondary | ICD-10-CM | POA: Diagnosis not present

## 2023-10-05 DIAGNOSIS — I129 Hypertensive chronic kidney disease with stage 1 through stage 4 chronic kidney disease, or unspecified chronic kidney disease: Secondary | ICD-10-CM | POA: Diagnosis not present

## 2023-10-05 DIAGNOSIS — E785 Hyperlipidemia, unspecified: Secondary | ICD-10-CM | POA: Diagnosis not present

## 2023-10-05 DIAGNOSIS — E871 Hypo-osmolality and hyponatremia: Secondary | ICD-10-CM | POA: Diagnosis not present

## 2023-10-09 ENCOUNTER — Other Ambulatory Visit: Payer: Self-pay | Admitting: Nurse Practitioner

## 2023-10-09 DIAGNOSIS — I10 Essential (primary) hypertension: Secondary | ICD-10-CM

## 2023-10-09 DIAGNOSIS — E782 Mixed hyperlipidemia: Secondary | ICD-10-CM

## 2023-10-27 ENCOUNTER — Telehealth: Payer: Self-pay

## 2023-10-27 DIAGNOSIS — E890 Postprocedural hypothyroidism: Secondary | ICD-10-CM

## 2023-10-27 NOTE — Telephone Encounter (Signed)
 Copied from CRM [id number]. Topic: General - Other >> Oct 27, 2023  1:21 PM Rosina BIRCH wrote: Reason for CRM: patient daughter called stating the patient want to know why her levothyroxine  was changed from 88mg  to 75mg  [phone]

## 2023-10-30 NOTE — Telephone Encounter (Signed)
 I was out of office when her labs resulted. Another clinician reviewed them and her TSH was high suggesting that she need a dose decrease. She is suppose to get it rechecked 6 weeks after starting the medication. I do not see that she has an appointment scheduled. I have placed the lab orders

## 2023-10-30 NOTE — Addendum Note (Signed)
 Addended by: WENDEE LYNWOOD HERO on: 10/30/2023 07:54 AM   Modules accepted: Orders

## 2023-10-30 NOTE — Telephone Encounter (Signed)
 Called pt and scheduled 6 week lab visit for 12/11/23.  No questions or concerns.

## 2023-11-20 ENCOUNTER — Encounter: Payer: Self-pay | Admitting: Radiology

## 2023-12-09 ENCOUNTER — Ambulatory Visit (INDEPENDENT_AMBULATORY_CARE_PROVIDER_SITE_OTHER)

## 2023-12-09 ENCOUNTER — Ambulatory Visit: Admission: EM | Admit: 2023-12-09 | Discharge: 2023-12-09 | Disposition: A | Discharge status: Home / Self Care

## 2023-12-09 DIAGNOSIS — M79641 Pain in right hand: Secondary | ICD-10-CM | POA: Diagnosis not present

## 2023-12-09 DIAGNOSIS — M79642 Pain in left hand: Secondary | ICD-10-CM | POA: Diagnosis not present

## 2023-12-09 DIAGNOSIS — M7989 Other specified soft tissue disorders: Secondary | ICD-10-CM

## 2023-12-09 MED ORDER — PREDNISONE 20 MG PO TABS: 40.0000 mg | ORAL_TABLET | Freq: Every day | ORAL | 0 refills | Status: AC
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
## 2023-12-09 NOTE — ED Triage Notes (Signed)
 Patient is here with grand daughter who request to interpret. Language assistant unavailable for her preferred Language of choice.  She reports right hand swelling and now noticing the swelling on the left hand (index finger). No injury known. Upcoming appt with PCP on Monday.

## 2023-12-09 NOTE — Discharge Instructions (Addendum)
 X-ray of both hands done today.  There are no acute findings although there is soft tissue swelling present.  Symptoms and physical exam findings may be consistent with gout versus acute formation of the left and right hand and fingers.  Given the symptoms present and the course of the symptoms we will treat with a steroid by mouth and recommend continuing follow-up with your primary care provider as scheduled. Prednisone  40 mg (2 tablets) once daily for 5 days. Take this in the morning.  This is a steroid to help with inflammation and pain.  May use cool compresses to help with the swelling Keep follow-up as scheduled with your primary care provider May return to urgent care if needed

## 2023-12-11 ENCOUNTER — Other Ambulatory Visit (INDEPENDENT_AMBULATORY_CARE_PROVIDER_SITE_OTHER)

## 2023-12-11 DIAGNOSIS — E890 Postprocedural hypothyroidism: Secondary | ICD-10-CM

## 2023-12-12 LAB — TSH: TSH: 1.62 u[IU]/mL (ref 0.35–5.50)

## 2023-12-12 LAB — T4, FREE: Free T4: 0.97 ng/dL (ref 0.60–1.60)

## 2023-12-18 ENCOUNTER — Ambulatory Visit: Payer: Self-pay | Admitting: Nurse Practitioner

## 2023-12-25 ENCOUNTER — Ambulatory Visit: Admitting: Family Medicine

## 2023-12-25 ENCOUNTER — Encounter: Payer: Self-pay | Admitting: Family Medicine

## 2023-12-25 ENCOUNTER — Ambulatory Visit: Payer: Self-pay

## 2023-12-25 VITALS — BP 120/72 | HR 70 | Temp 99.2°F | Ht 60.0 in | Wt 167.2 lb

## 2023-12-25 DIAGNOSIS — M25432 Effusion, left wrist: Secondary | ICD-10-CM | POA: Diagnosis not present

## 2023-12-25 MED ORDER — PREDNISONE 20 MG PO TABS: ORAL_TABLET | ORAL | 0 refills | Status: DC

## 2023-12-25 NOTE — Telephone Encounter (Signed)
 Booked same day, confirmed pt denies requiring or requesting interpreter for OV.   FYI Only or Action Required?: FYI only for provider: appointment scheduled on 12/25/23.  Patient was last seen in primary care on 09/20/2023 by Rachel Lynwood HERO, NP.  Called Nurse Triage reporting Hand Pain.  Symptoms began several weeks ago.  Interventions attempted: Prescription medications: prednisone .  Symptoms are: gradually worsening.  Triage Disposition: See PCP When Office is Open (Within 3 Days)  Patient/caregiver understands and will follow disposition?:   Copied from CRM [id number]. Topic: Clinical - Red Word Triage >> Dec 25, 2023  8:16 AM Rachel Quinn wrote: Red Word that prompted transfer to Nurse Triage: Patitent is calling about swollen hand that hurts really bad was in urgent care last week got predisone and now it is hurting again. Dr. Wendee Quinn Rachel Quinn Reason for Disposition  [1] MODERATE pain (e.g., interferes with normal activities) AND [2] present > 3 days  Answer Assessment - Initial Assessment Questions Pt was seen at [facility] on 12/09/23 for bilateral hand pain, worse on left side. Also reporting left foot/toe pain and edema. Tx with prednisone  x 5 days.   1. ONSET: When did the pain start?     12/07/23 2. LOCATION: Where is the pain located?     See above 3. PAIN: How bad is the pain? (Scale 1-10; or mild, moderate, severe)     Pain prevents full usage 4. WORK OR EXERCISE: Has there been any recent work or exercise that involved this part (i.e., hand or wrist) of the body?     Denies 5. CAUSE: What do you think is causing the pain?     Unknown 6. AGGRAVATING FACTORS: What makes the pain worse? (e.g., using computer)     Cooking  Protocols used: Hand Pain-A-AH

## 2023-12-25 NOTE — Progress Notes (Unsigned)
     Rachel Ales T. Adnan Vanvoorhis, MD, CAQ Sports Medicine [facility] [address] [place], [address]  Phone: [phone]  FAX: [phone]  Rachel Quinn - 78 y.o. female  MRN [id number]  Date of Birth: November 29, 1945  Date: 12/25/2023  PCP: Wendee Lynwood HERO, NP  Referral: Wendee Lynwood HERO, NP  Chief Complaint  Patient presents with   Hand Pain    Bilateral Swelling and Pain Urgent Care 12/09/23   Neck Pain   Knee Pain    Left Swelling   Subjective:   Rachel Quinn is a 78 y.o. very pleasant female patient with Body mass index is 32.66 kg/m. who presents with the following:  Discussed the use of AI scribe software for clinical note transcription with the patient, who gave verbal consent to proceed.  H/o L knee arthroscopy, 09/10/2023, s/p arthroscopy for irrigation, debridement for septic arthritis.  Neck pain  Hand pain - given Pred 40 mg x 5 days   History of Present Illness     Review of Systems is noted in the HPI, as appropriate  Objective:   BP 120/72   Pulse 70   Temp 99.2 F (37.3 C) (Temporal)   Ht 5' (1.524 m)   Wt 167 lb 4 oz (75.9 kg)   SpO2 96%   BMI 32.66 kg/m   GEN: No acute distress; alert,appropriate. PULM: Breathing comfortably in no respiratory distress PSYCH: Normally interactive.        Laboratory and Imaging Data:  Assessment and Plan:   No diagnosis found. Assessment & Plan   Medication Management during today's office visit: No orders of the defined types were placed in this encounter.  Medications Discontinued During This Encounter  Medication Reason   oxyCODONE  (OXY IR/ROXICODONE ) 5 MG immediate release tablet Completed Course    Orders placed today for conditions managed today: No orders of the defined types were placed in this encounter.   Disposition: No follow-ups on file.  [employer] speech-to-text software was used for transcription in this  dictation.  Possible transcriptional errors can occur using Animal nutritionist.   Signed,  Jacques DASEN. Garrick Midgley, MD   Outpatient Encounter Medications as of 12/25/2023  Medication Sig   acetaminophen  (TYLENOL ) 500 MG tablet Take 500 mg by mouth every 6 (six) hours as needed.   albuterol  (VENTOLIN  HFA) 108 (90 Base) MCG/ACT inhaler INHALE 2 PUFFS BY MOUTH EVERY 6 HOURS AS NEEDED FOR SHORTNESS OF BREATH   atenolol -chlorthalidone  (TENORETIC ) 50-25 MG tablet Take 1 tablet by mouth once daily   atorvastatin  (LIPITOR) 10 MG tablet Take 1 tablet by mouth once daily   calcium  carbonate (OS-CAL - DOSED IN MG OF ELEMENTAL CALCIUM ) 1250 (500 Ca) MG tablet Take 1 tablet by mouth.   levothyroxine  (SYNTHROID ) 75 MCG tablet Take 1 tablet (75 mcg total) by mouth daily.   potassium citrate  (UROCIT-K ) 10 MEQ (1080 MG) SR tablet Take 1 tablet by mouth once daily   [DISCONTINUED] oxyCODONE  (OXY IR/ROXICODONE ) 5 MG immediate release tablet Take 1 tablet (5 mg total) by mouth every 4 (four) hours as needed for severe pain (pain score 7-10). (Patient not taking: Reported on 10/04/2023)   No facility-administered encounter medications on file as of 12/25/2023.

## 2023-12-25 NOTE — Telephone Encounter (Signed)
 noted

## 2023-12-26 ENCOUNTER — Encounter: Payer: Self-pay | Admitting: Family Medicine

## 2023-12-26 LAB — URIC ACID: Uric Acid, Serum: 6.6 mg/dL (ref 2.4–7.0)

## 2023-12-28 ENCOUNTER — Ambulatory Visit: Payer: Self-pay | Admitting: Family Medicine

## 2024-01-10 ENCOUNTER — Ambulatory Visit (INDEPENDENT_AMBULATORY_CARE_PROVIDER_SITE_OTHER): Admitting: Nurse Practitioner

## 2024-01-10 VITALS — BP 122/80 | HR 65 | Temp 98.1°F | Ht 60.0 in | Wt 168.4 lb

## 2024-01-10 DIAGNOSIS — E890 Postprocedural hypothyroidism: Secondary | ICD-10-CM | POA: Diagnosis not present

## 2024-01-10 DIAGNOSIS — I10 Essential (primary) hypertension: Secondary | ICD-10-CM

## 2024-01-10 DIAGNOSIS — M79642 Pain in left hand: Secondary | ICD-10-CM

## 2024-01-10 DIAGNOSIS — M6289 Other specified disorders of muscle: Secondary | ICD-10-CM

## 2024-01-10 MED ORDER — TIZANIDINE HCL 2 MG PO TABS: 2.0000 mg | ORAL_TABLET | Freq: Every evening | ORAL | 0 refills | Status: DC | PRN

## 2024-01-10 MED ORDER — ATENOLOL-CHLORTHALIDONE 50-25 MG PO TABS: 1.0000 | ORAL_TABLET | Freq: Every day | ORAL | 1 refills | Status: DC

## 2024-01-10 MED ORDER — PREDNISONE 20 MG PO TABS: 40.0000 mg | ORAL_TABLET | Freq: Every day | ORAL | 0 refills | Status: DC

## 2024-01-10 MED ORDER — LEVOTHYROXINE SODIUM 75 MCG PO TABS: 75.0000 ug | ORAL_TABLET | Freq: Every day | ORAL | 1 refills | Status: AC

## 2024-01-10 NOTE — Patient Instructions (Signed)
 Nice to see you today I have sent in medications to the pharmacy. One is a muscle relaxer that can make you sleepy Follow up if you do not improve

## 2024-01-10 NOTE — Progress Notes (Signed)
 "  Established Patient Office Visit  Subjective   Patient ID: Rachel Quinn, female    DOB: 1945/02/24  Age: 78 y.o. MRN: [id number]  Chief Complaint  Patient presents with   Arthritis    Pt complains of swelling in left hand. Pt states it started 4 days after being on prednisone . Pt complains of hand very painful. Hand appears to be slightly reddish.    Gout     Discussed the use of AI scribe software for clinical note transcription with the patient, who gave verbal consent to proceed.  History of Present Illness Rachel Quinn is a 78 year old female who presents with swelling and pain in the wrist and neck.  She has been experiencing swelling in her wrist for more than a week. Initially, prednisone  resolved the swelling, but it recurred after one week. The swelling is accompanied by warmth and constant pain, which worsens with movement. The pain extends from the wrist to the knuckles and sometimes affects other areas of the hand. No redness is associated with the swelling, but warmth is present when the area is swollen.  She also experiences neck pain, described as achy and present even when sitting still. The pain is exacerbated by movement, such as bending or turning her head, and is more pronounced on the left side. She reports difficulty sleeping due to the pain and has been taking Tylenol  intermittently, which provides some relief.  She mentions a dietary change, avoiding meat for a week and consuming fish, but the swelling persisted. Her current medications include atenolol  for blood pressure and levothyroxine  for thyroid  management. She has a history of having only one kidney, which she is concerned about when considering new medications.    Review of Systems  Constitutional:  Negative for chills and fever.  Respiratory:  Negative for shortness of breath.   Cardiovascular:  Negative for chest pain.  Musculoskeletal:  Positive for joint pain.  Neurological:   Positive for weakness. Negative for headaches.      Objective:     BP 122/80   Pulse 65   Temp 98.1 F (36.7 C) (Oral)   Ht 5' (1.524 m)   Wt 168 lb 6.4 oz (76.4 kg)   SpO2 98%   BMI 32.89 kg/m    Physical Exam Vitals and nursing note reviewed.  Constitutional:      Appearance: Normal appearance.  Cardiovascular:     Rate and Rhythm: Normal rate and regular rhythm.     Heart sounds: Normal heart sounds.  Pulmonary:     Effort: Pulmonary effort is normal.     Breath sounds: Normal breath sounds.  Musculoskeletal:        General: Tenderness present.       Arms:     Cervical back: Tenderness present. No bony tenderness or crepitus. No pain with movement.     Comments: Left sided trap muscle tightness Discomfort with all AROM   Neurological:     Mental Status: She is alert.      No results found for any visits on 01/10/24.    The 10-year ASCVD risk score (Arnett DK, et al., 2019) is: 23.5%    Assessment & Plan:   Problem List Items Addressed This Visit       Cardiovascular and Mediastinum   Essential hypertension   Relevant Medications   atenolol -chlorthalidone  (TENORETIC ) 50-25 MG tablet     Endocrine   Hypothyroidism, postsurgical - Primary   Relevant Medications   levothyroxine  (  SYNTHROID ) 75 MCG tablet   Other Visit Diagnoses       Muscle tightness       Relevant Medications   tiZANidine  (ZANAFLEX ) 2 MG tablet     Left hand pain       Relevant Medications   predniSONE  (DELTASONE ) 20 MG tablet      Assessment and Plan Assessment & Plan Acutepain of left wrist and hand, possible gout Swelling and pain in the left wrist and hand suggest gout or pseudogout. Chlorthalidone  may exacerbate symptoms. - Prescribed prednisone  40 mg twice daily for 5 days with food. - Consider changing blood pressure medication if symptoms persist.  Neck pain with muscle spasm Neck pain with muscle tightness, especially on the left side, worsens with  movement. - Prescribed tizanidine   2mg  as needed at bedtime for muscle relaxation.  Essential hypertension Blood pressure management ongoing with atenolol . Possible medication adjustment if gout symptoms persist. - Refilled atenolol  prescription for 90 days.  Hypothyroidism - Refilled levothyroxine  prescription for 90 days.  Return if symptoms worsen or fail to improve.    Adina Crandall, NP  "

## 2024-01-12 ENCOUNTER — Encounter: Payer: Self-pay | Admitting: Emergency Medicine

## 2024-01-12 ENCOUNTER — Ambulatory Visit: Payer: Self-pay

## 2024-01-12 ENCOUNTER — Emergency Department
Admission: EM | Admit: 2024-01-12 | Discharge: 2024-01-12 | Disposition: A | Discharge status: Home / Self Care | Attending: Emergency Medicine | Admitting: Emergency Medicine

## 2024-01-12 ENCOUNTER — Emergency Department

## 2024-01-12 ENCOUNTER — Other Ambulatory Visit: Payer: Self-pay

## 2024-01-12 DIAGNOSIS — I10 Essential (primary) hypertension: Secondary | ICD-10-CM | POA: Insufficient documentation

## 2024-01-12 DIAGNOSIS — M79604 Pain in right leg: Secondary | ICD-10-CM | POA: Diagnosis not present

## 2024-01-12 DIAGNOSIS — M25561 Pain in right knee: Secondary | ICD-10-CM | POA: Diagnosis present

## 2024-01-12 DIAGNOSIS — M7121 Synovial cyst of popliteal space [Baker], right knee: Secondary | ICD-10-CM | POA: Insufficient documentation

## 2024-01-12 MED ORDER — TRAMADOL HCL 50 MG PO TABS
50.0000 mg | ORAL_TABLET | Freq: Once | ORAL | Status: AC
  Administered 2024-01-12: 50 mg via ORAL
  Filled 2024-01-12: qty 1

## 2024-01-12 MED ORDER — TRAMADOL HCL 50 MG PO TABS: 50.0000 mg | ORAL_TABLET | Freq: Four times a day (QID) | ORAL | 0 refills | Status: DC | PRN

## 2024-01-12 NOTE — ED Triage Notes (Signed)
 Pt reports pain behind the right knee that started last night. Pt reports she was sent by Dr for possible DVT.

## 2024-01-12 NOTE — ED Provider Notes (Signed)
 "  [facility] Provider Note    Event Date/Time   First MD Initiated Contact with Patient 01/12/24 1145     (approximate)  History   Chief Complaint: Leg Pain  HPI  Rachel Quinn is a 78 y.o. female with a past medical history of hypertension, presents to the emergency department for right knee pain.  According to the patient over the last few days she had pain in her right wrist and now the pain in the right wrist is gone and she is having pain behind the right knee.  3 days ago patient was started on prednisone  for presumed gout flare which the patient states she has had gout previously.  Patient's doctor was concerned that she may have a blood clot and referred her to the ER for an ultrasound.  Patient denies any chest pain or shortness of breath.  Physical Exam   Triage Vital Signs: ED Triage Vitals  Encounter Vitals Group     BP 01/12/24 1125 131/67     Girls Systolic BP Percentile --      Girls Diastolic BP Percentile --      Boys Systolic BP Percentile --      Boys Diastolic BP Percentile --      Pulse Rate 01/12/24 1125 67     Resp 01/12/24 1125 18     Temp 01/12/24 1125 98.5 F (36.9 C)     Temp Source 01/12/24 1125 Oral     SpO2 01/12/24 1125 99 %     Weight --      Height --      Head Circumference --      Peak Flow --      Pain Score 01/12/24 1232 9     Pain Loc --      Pain Education --      Exclude from Growth Chart --     Most recent vital signs: Vitals:   01/12/24 1125 01/12/24 1155  BP: 131/67   Pulse: 67   Resp: 18   Temp: 98.5 F (36.9 C)   SpO2: 99% 100%    General: Awake, no distress.  CV:  Good peripheral perfusion.  Regular rate and rhythm  Resp:  Normal effort.  Equal breath sounds bilaterally.  Abd:  No distention.   Other:  Mild tenderness palpation to the popliteal fossa of the right knee no obvious swelling no masses palpated.  No erythema of the leg.   ED Results / Procedures / Treatments    RADIOLOGY  Ultrasound negative for DVT   MEDICATIONS ORDERED IN ED: Medications  traMADol  (ULTRAM ) tablet 50 mg (50 mg Oral Given 01/12/24 1232)     IMPRESSION / MDM / ASSESSMENT AND PLAN / ED COURSE  I reviewed the triage vital signs and the nursing notes.  Patient's presentation is most consistent with acute illness / injury with system symptoms.  Patient presents emergency department for right leg pain.  Patient is having some discomfort behind the right knee.  This very likely could be gout related as the patient states initially she was having pain in the right wrist which is now resolved and now it is behind the right knee.  However given her symptoms we will obtain an ultrasound of the leg to rule out clot.  We will dose tramadol  for discomfort.  Patient is already taking a prednisone  taper.  If the ultrasound is negative I believe the patient could continue the prednisone  taper and follow-up with  her doctor.  We could prescribe a short course of tramadol  for the patient symptoms.  Ultrasound is negative for DVT.  Positive for Baker's cyst.  Will prescribe a short course of tramadol  and have the patient finish out her prednisone  taper and follow-up with her doctor.  FINAL CLINICAL IMPRESSION(S) / ED DIAGNOSES   Right knee pain   Note:  This document was prepared using Dragon voice recognition software and may include unintentional dictation errors.   Dorothyann Drivers, MD 01/12/24 1423  "

## 2024-01-12 NOTE — ED Notes (Signed)
 Pt in [place] at this time

## 2024-01-12 NOTE — Discharge Instructions (Addendum)
 Please take your pain medication as needed but only as prescribed.  Do not drink alcohol or drive while taking pain medication.  Please follow-up with your doctor as needed.

## 2024-01-12 NOTE — Telephone Encounter (Signed)
 Noted. I see that she is in the ED currently

## 2024-01-12 NOTE — Telephone Encounter (Signed)
 FYI Only or Action Required?: FYI only for provider: ED advised.  Patient was last seen in primary care on 01/10/2024 by Rachel Lynwood HERO, NP.  Called Nurse Triage reporting Leg Swelling.  Symptoms began yesterday.  Interventions attempted: OTC medications: tylenol .  Symptoms are: unchanged.  Triage Disposition: See HCP Within 4 Hours (Or PCP Triage)  Patient/caregiver understands and will follow disposition?: Yes   Copied from CRM [id number]. Topic: Clinical - Red Word Triage >> Jan 12, 2024  9:08 AM Chasity T wrote: [facility] that prompted transfer to Nurse Triage: patient daughter is calling for swelling in leg/foot unable to walk. Reason for Disposition  [1] SEVERE pain (e.g., excruciating, unable to walk) AND [2] not improved after 2 hours of pain medicine  Answer Assessment - Initial Assessment Questions No available appts pcp. Advised ED now. [name] reports will go to [facility] ED.  Advised 911 if symptoms worsen: diff breathing, chest pain last > 5 mins, faint, weakness/ numbness one side of body, severe HA, diff walking and diff speech.  [name] and patient verbalized understanding.   1. LOCATION: Where is the swelling located?  (e.g., left, right, both knees)     Right knee 2. ONSET: When did the swelling start? Does it come and go, or is it there all the time?     Last night around 9 PM 3. SWELLING: How bad is the swelling? Or, How large is it? (e.g., mild, moderate, severe; size of localized swelling)      severe 4. PAIN: Is there any pain? If Yes, ask: How bad is it? (Scale 0-10; or none, mild, moderate, severe)     9/10;throbbing,  tylenol ; seen [facility] NP for hand swelling, took prednisone  Wednesday, but now her leg 5. SETTING: Has there been any recent work, exercise or other activity that involved that part of the body?      no 6. AGGRAVATING FACTORS: What makes the knee swelling worse? (e.g., walking, climbing stairs,  running)     Standing, walking 7. ASSOCIATED SYMPTOMS: Is there any pain or redness?     Pain, swelling, hot to touch 8. OTHER SYMPTOMS: Do you have any other symptoms? (e.g., calf pain, chest pain, difficulty breathing, fever) Denies calf muscle, diff breathing, chest pain, fever chills n/v, rash  Protocols used: Knee Swelling-A-AH

## 2024-01-16 ENCOUNTER — Telehealth: Payer: Self-pay | Admitting: Nurse Practitioner

## 2024-01-16 DIAGNOSIS — M79642 Pain in left hand: Secondary | ICD-10-CM

## 2024-01-16 NOTE — Telephone Encounter (Signed)
 Call and see if neck is better?   See how the hand is doing

## 2024-01-16 NOTE — Telephone Encounter (Signed)
"  Left voicemail for patient to call the office back.     "

## 2024-01-16 NOTE — Telephone Encounter (Signed)
-----   Message from Lynwood Crandall, NP sent at 01/10/2024 12:22 PM EST ----- Regarding: hand/neck pain Call and see if neck is better?  See how the hand is doing

## 2024-01-16 NOTE — Telephone Encounter (Signed)
 Called and spoke with patient.  Pt complains that her neck has gotten better. Its okay  States that her hand is still swollen. No changes.

## 2024-01-17 NOTE — Addendum Note (Signed)
 Addended by: WENDEE LYNWOOD HERO on: 01/17/2024 03:02 PM   Modules accepted: Orders

## 2024-01-17 NOTE — Telephone Encounter (Signed)
 Spoke with patient. Pt would like hand specialist in [place] location.

## 2024-01-17 NOTE — Telephone Encounter (Signed)
 If she is still having problems I can send her to a hand specialist if she would like

## 2024-01-17 NOTE — Telephone Encounter (Unsigned)
 Copied from CRM #[id number]. Topic: General - Call Back - No Documentation >> Jan 16, 2024  4:37 PM Viola F wrote: Reason for CRM: Patient daughter returned Jenae's call - I let her know that Forrest spoke with patient this morning and sent updates to [facility]. [facility] - she would like a call back on a plan moving forward . [phone]

## 2024-01-19 ENCOUNTER — Other Ambulatory Visit: Payer: Self-pay | Admitting: Nurse Practitioner

## 2024-01-19 DIAGNOSIS — E782 Mixed hyperlipidemia: Secondary | ICD-10-CM

## 2024-01-23 ENCOUNTER — Telehealth: Payer: Self-pay | Admitting: Physician Assistant

## 2024-01-23 NOTE — Telephone Encounter (Signed)
 Placed Tagalog (Filipino) interpreter request via [facility] Interpreter Request Portal

## 2024-01-24 ENCOUNTER — Ambulatory Visit

## 2024-01-24 VITALS — BP 136/83 | HR 60 | Ht 60.0 in | Wt 170.2 lb

## 2024-01-24 DIAGNOSIS — M25512 Pain in left shoulder: Secondary | ICD-10-CM

## 2024-01-24 DIAGNOSIS — M79641 Pain in right hand: Secondary | ICD-10-CM
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
## 2024-01-29 ENCOUNTER — Telehealth: Payer: Self-pay | Admitting: *Deleted

## 2024-01-29 ENCOUNTER — Telehealth: Payer: Self-pay | Admitting: Orthopedic Surgery

## 2024-01-29 ENCOUNTER — Observation Stay

## 2024-01-29 ENCOUNTER — Other Ambulatory Visit: Payer: Self-pay

## 2024-01-29 ENCOUNTER — Emergency Department

## 2024-01-29 ENCOUNTER — Inpatient Hospital Stay
Admission: EM | Admit: 2024-01-29 | Discharge: 2024-02-02 | DRG: 640 | Disposition: A | Discharge status: Home / Self Care

## 2024-01-29 ENCOUNTER — Ambulatory Visit: Admitting: Family Medicine

## 2024-01-29 ENCOUNTER — Ambulatory Visit: Payer: Self-pay | Admitting: Family Medicine

## 2024-01-29 ENCOUNTER — Encounter: Payer: Self-pay | Admitting: Family Medicine

## 2024-01-29 ENCOUNTER — Encounter: Payer: Self-pay | Admitting: Emergency Medicine

## 2024-01-29 VITALS — BP 130/78 | HR 74 | Temp 98.0°F | Ht 60.0 in | Wt 174.5 lb

## 2024-01-29 DIAGNOSIS — J029 Acute pharyngitis, unspecified: Secondary | ICD-10-CM

## 2024-01-29 DIAGNOSIS — E876 Hypokalemia: Secondary | ICD-10-CM | POA: Diagnosis present

## 2024-01-29 DIAGNOSIS — M6289 Other specified disorders of muscle: Secondary | ICD-10-CM

## 2024-01-29 DIAGNOSIS — M1990 Unspecified osteoarthritis, unspecified site: Secondary | ICD-10-CM | POA: Diagnosis present

## 2024-01-29 DIAGNOSIS — Z79890 Hormone replacement therapy (postmenopausal): Secondary | ICD-10-CM

## 2024-01-29 DIAGNOSIS — J189 Pneumonia, unspecified organism: Secondary | ICD-10-CM | POA: Diagnosis not present

## 2024-01-29 DIAGNOSIS — D72829 Elevated white blood cell count, unspecified: Secondary | ICD-10-CM | POA: Diagnosis present

## 2024-01-29 DIAGNOSIS — E782 Mixed hyperlipidemia: Secondary | ICD-10-CM

## 2024-01-29 DIAGNOSIS — R1319 Other dysphagia: Secondary | ICD-10-CM | POA: Insufficient documentation

## 2024-01-29 DIAGNOSIS — E871 Hypo-osmolality and hyponatremia: Principal | ICD-10-CM | POA: Diagnosis present

## 2024-01-29 DIAGNOSIS — E785 Hyperlipidemia, unspecified: Secondary | ICD-10-CM | POA: Diagnosis present

## 2024-01-29 DIAGNOSIS — K5900 Constipation, unspecified: Secondary | ICD-10-CM | POA: Diagnosis not present

## 2024-01-29 DIAGNOSIS — R54 Age-related physical debility: Secondary | ICD-10-CM | POA: Diagnosis present

## 2024-01-29 DIAGNOSIS — R1314 Dysphagia, pharyngoesophageal phase: Secondary | ICD-10-CM | POA: Diagnosis present

## 2024-01-29 DIAGNOSIS — R9389 Abnormal findings on diagnostic imaging of other specified body structures: Secondary | ICD-10-CM

## 2024-01-29 DIAGNOSIS — I10 Essential (primary) hypertension: Secondary | ICD-10-CM | POA: Diagnosis present

## 2024-01-29 DIAGNOSIS — E890 Postprocedural hypothyroidism: Secondary | ICD-10-CM | POA: Diagnosis present

## 2024-01-29 DIAGNOSIS — M2550 Pain in unspecified joint: Secondary | ICD-10-CM | POA: Diagnosis not present

## 2024-01-29 DIAGNOSIS — I129 Hypertensive chronic kidney disease with stage 1 through stage 4 chronic kidney disease, or unspecified chronic kidney disease: Secondary | ICD-10-CM | POA: Diagnosis present

## 2024-01-29 DIAGNOSIS — R062 Wheezing: Secondary | ICD-10-CM | POA: Diagnosis not present

## 2024-01-29 DIAGNOSIS — R531 Weakness: Secondary | ICD-10-CM

## 2024-01-29 DIAGNOSIS — N182 Chronic kidney disease, stage 2 (mild): Secondary | ICD-10-CM | POA: Diagnosis present

## 2024-01-29 DIAGNOSIS — Z905 Acquired absence of kidney: Secondary | ICD-10-CM

## 2024-01-29 DIAGNOSIS — Z79899 Other long term (current) drug therapy: Secondary | ICD-10-CM

## 2024-01-29 LAB — CK: Total CK: 88 U/L (ref 38–234)

## 2024-01-29 LAB — COMPREHENSIVE METABOLIC PANEL WITH GFR
ALT: 14 U/L (ref 0–44)
AST: 21 U/L (ref 15–41)
Albumin: 3.5 g/dL (ref 3.5–5.0)
Alkaline Phosphatase: 83 U/L (ref 38–126)
Anion gap: 11 (ref 5–15)
BUN: 13 mg/dL (ref 8–23)
CO2: 24 mmol/L (ref 22–32)
Calcium: 9.2 mg/dL (ref 8.9–10.3)
Chloride: 77 mmol/L — ABNORMAL LOW (ref 98–111)
Creatinine, Ser: 0.66 mg/dL (ref 0.44–1.00)
GFR, Estimated: >60 mL/min
Glucose, Bld: 107 mg/dL — ABNORMAL HIGH (ref 70–99)
Potassium: 3.5 mmol/L (ref 3.5–5.1)
Sodium: 112 mmol/L — CL (ref 135–145)
Total Bilirubin: 2.2 mg/dL — ABNORMAL HIGH (ref 0.0–1.2)
Total Protein: 7.1 g/dL (ref 6.5–8.1)

## 2024-01-29 LAB — URINALYSIS, ROUTINE W REFLEX MICROSCOPIC
Bilirubin Urine: NEGATIVE
Glucose, UA: NEGATIVE mg/dL
Hgb urine dipstick: NEGATIVE
Ketones, ur: NEGATIVE mg/dL
Leukocytes,Ua: NEGATIVE
Nitrite: NEGATIVE
Protein, ur: NEGATIVE mg/dL
Specific Gravity, Urine: >1.046 — ABNORMAL HIGH (ref 1.005–1.030)
pH: 7 (ref 5.0–8.0)

## 2024-01-29 LAB — CBC WITH DIFFERENTIAL/PLATELET
Basophils Absolute: 0 K/uL (ref 0.0–0.1)
Basophils Relative: 0.1 % (ref 0.0–3.0)
Eosinophils Absolute: 0 K/uL (ref 0.0–0.7)
Eosinophils Relative: 0 % (ref 0.0–5.0)
HCT: 35.2 % — ABNORMAL LOW (ref 36.0–46.0)
Hemoglobin: 12.4 g/dL (ref 12.0–15.0)
Lymphocytes Relative: 5.2 % — ABNORMAL LOW (ref 12.0–46.0)
Lymphs Abs: 0.7 K/uL (ref 0.7–4.0)
MCHC: 35.2 g/dL (ref 30.0–36.0)
MCV: 88.6 fl (ref 78.0–100.0)
Monocytes Absolute: 0.9 K/uL (ref 0.1–1.0)
Monocytes Relative: 6.3 % (ref 3.0–12.0)
Neutro Abs: 12.8 K/uL — ABNORMAL HIGH (ref 1.4–7.7)
Neutrophils Relative %: 88.4 % — ABNORMAL HIGH (ref 43.0–77.0)
Platelets: 252 K/uL (ref 150.0–400.0)
RBC: 3.97 Mil/uL (ref 3.87–5.11)
RDW: 13.5 % (ref 11.5–15.5)
WBC: 14.5 K/uL — ABNORMAL HIGH (ref 4.0–10.5)

## 2024-01-29 LAB — CBC
HCT: 32.9 % — ABNORMAL LOW (ref 36.0–46.0)
Hemoglobin: 12.2 g/dL (ref 12.0–15.0)
MCH: 31.2 pg (ref 26.0–34.0)
MCHC: 37.1 g/dL — ABNORMAL HIGH (ref 30.0–36.0)
MCV: 84.1 fL (ref 80.0–100.0)
Platelets: 237 K/uL (ref 150–400)
RBC: 3.91 MIL/uL (ref 3.87–5.11)
RDW: 12.1 % (ref 11.5–15.5)
WBC: 16.1 K/uL — ABNORMAL HIGH (ref 4.0–10.5)
nRBC: 0 % (ref 0.0–0.2)

## 2024-01-29 LAB — BASIC METABOLIC PANEL WITH GFR
BUN: 14 mg/dL (ref 6–23)
CO2: 27 meq/L (ref 19–32)
Calcium: 8.9 mg/dL (ref 8.4–10.5)
Chloride: 77 meq/L — ABNORMAL LOW (ref 96–112)
Creatinine, Ser: 0.67 mg/dL (ref 0.40–1.20)
GFR: 84.47 mL/min
Glucose, Bld: 121 mg/dL — ABNORMAL HIGH (ref 70–99)
Potassium: 3.4 meq/L — ABNORMAL LOW (ref 3.5–5.1)
Sodium: 112 meq/L — CL (ref 135–145)

## 2024-01-29 LAB — SEDIMENTATION RATE: Sed Rate: 85 mm/h — ABNORMAL HIGH (ref 0–30)

## 2024-01-29 LAB — OSMOLALITY, URINE: Osmolality, Ur: 506 mosm/kg (ref 300–900)

## 2024-01-29 LAB — SODIUM: Sodium: 114 mmol/L — CL (ref 135–145)

## 2024-01-29 MED ORDER — HEPARIN SODIUM (PORCINE) 5000 UNIT/ML IJ SOLN
5000.0000 [IU] | Freq: Three times a day (TID) | INTRAMUSCULAR | Status: AC
  Administered 2024-01-29 – 2024-02-01 (×9): 5000 [IU] via SUBCUTANEOUS
  Filled 2024-01-29 (×9): qty 1

## 2024-01-29 MED ORDER — SODIUM CHLORIDE 0.9 % IV BOLUS
500.0000 mL | Freq: Once | INTRAVENOUS | Status: AC
  Administered 2024-01-29: 500 mL via INTRAVENOUS

## 2024-01-29 MED ORDER — ONDANSETRON HCL 4 MG/2ML IJ SOLN
4.0000 mg | Freq: Once | INTRAMUSCULAR | Status: AC
  Administered 2024-01-29: 4 mg via INTRAVENOUS
  Filled 2024-01-29: qty 2

## 2024-01-29 MED ORDER — OXYCODONE HCL 5 MG PO TABS
2.5000 mg | ORAL_TABLET | Freq: Once | ORAL | Status: DC
  Filled 2024-01-29: qty 1

## 2024-01-29 MED ORDER — PREDNISONE 10 MG PO TABS: ORAL_TABLET | ORAL | 0 refills | Status: DC

## 2024-01-29 MED ORDER — IOHEXOL 300 MG/ML  SOLN
100.0000 mL | Freq: Once | INTRAMUSCULAR | Status: AC | PRN
  Administered 2024-01-29: 100 mL via INTRAVENOUS

## 2024-01-29 MED ORDER — ACETAMINOPHEN 325 MG PO TABS
650.0000 mg | ORAL_TABLET | Freq: Four times a day (QID) | ORAL | Status: DC | PRN
  Administered 2024-01-30: 650 mg via ORAL
  Filled 2024-01-29: qty 2

## 2024-01-29 MED ORDER — ONDANSETRON HCL 4 MG PO TABS: 4.0000 mg | ORAL_TABLET | Freq: Four times a day (QID) | ORAL | Status: DC | PRN

## 2024-01-29 MED ORDER — ALBUTEROL SULFATE (2.5 MG/3ML) 0.083% IN NEBU
2.5000 mg | INHALATION_SOLUTION | Freq: Once | RESPIRATORY_TRACT | Status: AC
  Administered 2024-01-29: 2.5 mg via RESPIRATORY_TRACT

## 2024-01-29 MED ORDER — ONDANSETRON HCL 4 MG/2ML IJ SOLN: 4.0000 mg | Freq: Four times a day (QID) | INTRAMUSCULAR | Status: DC | PRN

## 2024-01-29 MED ORDER — TIZANIDINE HCL 2 MG PO TABS: 2.0000 mg | ORAL_TABLET | Freq: Every evening | ORAL | 0 refills | Status: DC | PRN

## 2024-01-29 MED ORDER — SODIUM CHLORIDE 0.9% FLUSH
3.0000 mL | Freq: Two times a day (BID) | INTRAVENOUS | Status: DC
  Administered 2024-01-29 – 2024-02-02 (×5): 3 mL via INTRAVENOUS

## 2024-01-29 MED ORDER — ACETAMINOPHEN 500 MG PO TABS
1000.0000 mg | ORAL_TABLET | Freq: Once | ORAL | Status: AC
  Administered 2024-01-29: 1000 mg via ORAL
  Filled 2024-01-29: qty 2

## 2024-01-29 MED ORDER — TRAMADOL HCL 50 MG PO TABS
50.0000 mg | ORAL_TABLET | Freq: Once | ORAL | Status: AC
  Administered 2024-01-29: 50 mg via ORAL
  Filled 2024-01-29: qty 1

## 2024-01-29 MED ORDER — SENNOSIDES-DOCUSATE SODIUM 8.6-50 MG PO TABS: 1.0000 | ORAL_TABLET | Freq: Every evening | ORAL | Status: DC | PRN

## 2024-01-29 MED ORDER — ALBUTEROL SULFATE HFA 108 (90 BASE) MCG/ACT IN AERS: 2.0000 | INHALATION_SPRAY | RESPIRATORY_TRACT | 0 refills | Status: DC | PRN

## 2024-01-29 MED ORDER — ACETAMINOPHEN 650 MG RE SUPP: 650.0000 mg | Freq: Four times a day (QID) | RECTAL | Status: DC | PRN

## 2024-01-29 NOTE — Telephone Encounter (Signed)
 Cora, patient's daughter, is calling with an new development in her mother's condition.  Her swelling is now moving into her groin area and she is in more pain.  They are waiting on a referral to rheumatology per Dr. Renda recommendation, but they would like to know what can be done in the meantime.  Can she have an Rx for prednisone  to help with the swelling?  Patient has 1 kidney and was told that would be the only thing she could take.  She uses the [facility] on [address].  Call back # is [phone].

## 2024-01-29 NOTE — Assessment & Plan Note (Signed)
 Worse for a week following viral uri  Improved with albuterol  NMT in office -reassuring   Prednisone  prescription 40 mg taper Reviewed possible side effects  Will watch for cough/fever  Update if not starting to improve in a week or if worsening  Call back and Er precautions noted in detail today

## 2024-01-29 NOTE — ED Triage Notes (Signed)
 Pt sent from PCP due to Na 112. Pt has been having joint pain since December, started in right foot and has traveled to both legs and arms. Daughter reports pt has been vomiting today and very weak where she has to pick her up to transfer.

## 2024-01-29 NOTE — Assessment & Plan Note (Signed)
 Currently taking atorvastatin   More muscle pain recently, making it hard to walk  Instructed to hold statin 2 wk to see if this may be statin related

## 2024-01-29 NOTE — Patient Instructions (Addendum)
 Let's check some labs today for inflammatory arthritis  We will reach out in several days with results   It looks like your orthopedic provider placed a referral to rheumatology  Call them and check on the status of the referral when you can   For wheezing, take prednisone  as directed This will likely help with the joint pain also   Hold the lipitor (atorvastatin ) for 2 weeks to see if it is causing muscle pain    Watch for worse breathing or any fever or worse cough

## 2024-01-29 NOTE — Assessment & Plan Note (Signed)
 Hand, wrist, left groin, both knees Difficulty walking Reviewed pcp/ortho and sport med notes Normal uric acid-? Of pseudogout in past  Was referred to rheum from ortho for likelyhood of inflam arthritis  Lab today Prednisone  40 mg taper  Has tramadol  on med list   Family will call ortho to check on status of rheum referral
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
## 2024-01-29 NOTE — Telephone Encounter (Signed)
 Please advise

## 2024-01-29 NOTE — Telephone Encounter (Signed)
 I called and advised pt's daughter about seeing pcp. She will call them now. Samule can you advise on referral to Rheumatology

## 2024-01-29 NOTE — Assessment & Plan Note (Signed)
 Refilled tizanadine ? If due to joint pain  ? If possible statin myopathy- instructed to hold statin for 2 wk/ will update pcp

## 2024-01-29 NOTE — H&P (Addendum)
 " History and Physical    Teressa L Smoker FMW:[id number] DOB: Aug 24, 1945 DOA: 01/29/2024  DOS: the patient was seen and examined on 01/29/2024  PCP: Wendee Lynwood HERO, NP   Patient coming from: Home  I have personally briefly reviewed patient's old medical records in [facility] and CareEverywhere  HPI:   Deiona L Fahey is a 79 y.o. year old female with medical history of HTN, HLD, hypothyroidism presenting to the ED after rheumatology office noted pt with severely low sodium.  Patient reports not eating well over the last several days and states she has been taking her medications regularly.  She reports malaise.  On ROS, complains of right wrist pain and states she has family history of gout. On arrival to the ED patient was noted to be HDS stable.  Lab work and imaging obtained.  Blood work repeated and CBC with leukocytosis at 16.1, normal hemoglobin.  CMP with low potassium at 112 unchanged from earlier today, potassium at 3.5, chloride at 77, bilirubin elevated at 2.2.  Patient given 500 cc of normal saline and CT abdomen pelvis obtained showing no acute findings.  Chest x-ray shows increased density at the right hilum and further characterization with chest CT with contrast recommended.  Given need for continued care, [facility] contacted for admission.  Review of Systems: As mentioned in the history of present illness. All other systems reviewed and are negative.   Past Medical History:  Diagnosis Date   Cataract    Chronic kidney disease    Heart murmur    Hypertension    Kidney stone    Thyroid  disease     Past Surgical History:  Procedure Laterality Date   CATARACT EXTRACTION Bilateral 01/2020   KIDNEY SURGERY  2000   KNEE ARTHROSCOPY Left 09/10/2023   Procedure: ARTHROSCOPY, KNEE;  Surgeon: Lorelle Hussar, MD;  Location: [facility] ORS;  Service: Orthopedics;  Laterality: Left;   NEPHRECTOMY Left 01/18/1999   2/2 nephrolithiasis   THYROIDECTOMY  01/17/2002   TUBAL  LIGATION       Allergies[1]  Family History  Problem Relation Age of Onset   Healthy Mother    Healthy Father    Cancer Neg Hx    Heart attack Neg Hx    Stroke Neg Hx    Colon cancer Neg Hx    Esophageal cancer Neg Hx    Rectal cancer Neg Hx    Stomach cancer Neg Hx     Prior to Admission medications  Medication Sig Start Date End Date Taking? Authorizing Provider  acetaminophen  (TYLENOL ) 500 MG tablet Take 500 mg by mouth every 6 (six) hours as needed.    [provider]  albuterol  (VENTOLIN  HFA) 108 (90 Base) MCG/ACT inhaler Inhale 2 puffs into the lungs every 4 (four) hours as needed for wheezing or shortness of breath. 01/29/24   Tower, Laine LABOR, MD  atenolol -chlorthalidone  (TENORETIC ) 50-25 MG tablet Take 1 tablet by mouth daily. Patient not taking: Reported on 01/29/2024 01/10/24   Wendee Lynwood HERO, NP  atorvastatin  (LIPITOR) 10 MG tablet Take 1 tablet by mouth once daily 01/19/24   Wendee Lynwood HERO, NP  calcium  carbonate (OS-CAL - DOSED IN MG OF ELEMENTAL CALCIUM ) 1250 (500 Ca) MG tablet Take 1 tablet by mouth.    [provider]  levothyroxine  (SYNTHROID ) 75 MCG tablet Take 1 tablet (75 mcg total) by mouth daily. 01/10/24   Wendee Lynwood HERO, NP  potassium citrate  (UROCIT-K ) 10 MEQ (1080 MG) SR tablet Take 1  tablet by mouth once daily 10/03/23   Wendee Lynwood HERO, NP  predniSONE  (DELTASONE ) 10 MG tablet Take 4 pills once daily by mouth for 3 days, then 3 pills daily for 3 days, then 2 pills daily for 3 days then 1 pill daily for 3 days then stop 01/29/24   Tower, Laine LABOR, MD  tiZANidine  (ZANAFLEX ) 2 MG tablet Take 1 tablet (2 mg total) by mouth at bedtime as needed for muscle spasms. 01/29/24   Tower, Laine LABOR, MD  traMADol  (ULTRAM ) 50 MG tablet Take 1 tablet (50 mg total) by mouth every 6 (six) hours as needed. 01/12/24   Dorothyann Drivers, MD    Social History:  reports that she has never smoked. She has never used smokeless tobacco. She reports that she does not drink  alcohol and does not use drugs.    Physical Exam: Vitals:   01/29/24 1658 01/29/24 1700  BP: 130/72   Pulse: 65   Resp: 18   Temp: 98.3 F (36.8 C)   TempSrc: Oral   SpO2: 98%   Weight:  79.1 kg  Height:  5' (1.524 m)    Gen: NAD HENT: NCAT, dry MM CV: normal heart sounds Lung: CTAB Abd: No TTP, normal bowel sounds MSK: No asymmetry, good bulk and tone Neuro: alert and oriented   Labs on Admission: I have personally reviewed following labs and imaging studies  CBC: Recent Labs  Lab 01/29/24 1248 01/29/24 1702  WBC 14.5* 16.1*  NEUTROABS 12.8*  --   HGB 12.4 12.2  HCT 35.2* 32.9*  MCV 88.6 84.1  PLT 252.0 237   Basic Metabolic Panel: Recent Labs  Lab 01/29/24 1248 01/29/24 1702 01/29/24 2137  NA 112* 112* 114*  K 3.4* 3.5  --   CL 77* 77*  --   CO2 27 24  --   GLUCOSE 121* 107*  --   BUN 14 13  --   CREATININE 0.67 0.66  --   CALCIUM  8.9 9.2  --    GFR: Estimated Creatinine Clearance: 54.8 mL/min (by C-G formula based on SCr of 0.66 mg/dL). Liver Function Tests: Recent Labs  Lab 01/29/24 1702  AST 21  ALT 14  ALKPHOS 83  BILITOT 2.2*  PROT 7.1  ALBUMIN 3.5   No results for input(s): LIPASE, AMYLASE in the last 168 hours. No results for input(s): AMMONIA in the last 168 hours. Coagulation Profile: No results for input(s): INR, PROTIME in the last 168 hours. Cardiac Enzymes: Recent Labs  Lab 01/29/24 2137  CKTOTAL 88   BNP (last 3 results) No results for input(s): BNP in the last 8760 hours. HbA1C: No results for input(s): HGBA1C in the last 72 hours. CBG: No results for input(s): GLUCAP in the last 168 hours. Lipid Profile: No results for input(s): CHOL, HDL, LDLCALC, TRIG, CHOLHDL, LDLDIRECT in the last 72 hours. Thyroid  Function Tests: No results for input(s): TSH, T4TOTAL, FREET4, T3FREE, THYROIDAB in the last 72 hours. Anemia Panel: No results for input(s): VITAMINB12, FOLATE,  FERRITIN, TIBC, IRON, RETICCTPCT in the last 72 hours. Urine analysis:    Component Value Date/Time   COLORURINE YELLOW (A) 01/29/2024 2137   APPEARANCEUR CLEAR (A) 01/29/2024 2137   LABSPEC >1.046 (H) 01/29/2024 2137   PHURINE 7.0 01/29/2024 2137   GLUCOSEU NEGATIVE 01/29/2024 2137   HGBUR NEGATIVE 01/29/2024 2137   BILIRUBINUR NEGATIVE 01/29/2024 2137   KETONESUR NEGATIVE 01/29/2024 2137   PROTEINUR NEGATIVE 01/29/2024 2137   NITRITE NEGATIVE 01/29/2024 2137   LEUKOCYTESUR  NEGATIVE 01/29/2024 2137    Radiological Exams on Admission: I have personally reviewed images DG Wrist Complete Right Result Date: 01/29/2024 CLINICAL DATA:  Wrist erythema EXAM: DG WRIST COMPLETE 3+V*R* COMPARISON:  12/09/2023 FINDINGS: No fracture or malalignment. Soft tissue edema without emphysema. Vascular calcifications IMPRESSION: No acute osseous abnormality. Electronically Signed   By: Luke Bun M.D.   On: 01/29/2024 22:30   CT ABDOMEN PELVIS W CONTRAST Result Date: 01/29/2024 EXAM: CT ABDOMEN AND PELVIS WITH CONTRAST 01/29/2024 07:31:28 PM TECHNIQUE: CT of the abdomen and pelvis was performed with the administration of 100 mL of iohexol  (OMNIPAQUE ) 300 MG/ML solution. Multiplanar reformatted images are provided for review. Automated exposure control, iterative reconstruction, and/or weight-based adjustment of the mA/kV was utilized to reduce the radiation dose to as low as reasonably achievable. COMPARISON: None available. CLINICAL HISTORY: Abdominal pain, acute, nonlocalized. FINDINGS: LOWER CHEST: Lung bases demonstrate mild right lower lobe atelectatic changes. Some scarring in the left lung base is noted as well. LIVER: The liver is unremarkable. GALLBLADDER AND BILE DUCTS: Gallbladder is unremarkable. No biliary ductal dilatation. SPLEEN: No acute abnormality. PANCREAS: No acute abnormality. ADRENAL GLANDS: The adrenal glands are within normal limits. KIDNEYS, URETERS AND BLADDER: The left kidney  has been surgically removed. The right kidney is mildly lobulated. No calculi or obstructive changes are noted in the right kidney. The bladder is well distended. GI AND BOWEL: Stomach and small bowel are unremarkable. No obstructive or inflammatory changes of the colon are seen. Mild diverticular changes noted without diverticulitis. The appendix is within normal limits. PERITONEUM AND RETROPERITONEUM: No ascites. No free air. VASCULATURE: Aorta is normal in caliber. Aortic calcifications are noted without aneurysmal dilatation. LYMPH NODES: No lymphadenopathy. REPRODUCTIVE ORGANS: The uterus is within normal limits for the patient's age. No adnexal mass is seen. BONES AND SOFT TISSUES: Degenerative changes of the lumbar spine are seen. Chronic T12 compression deformity is noted. IMPRESSION: 1. No acute findings in the abdomen or pelvis. Electronically signed by: Oneil Devonshire MD MD 01/29/2024 08:33 PM EST RP Workstation: [id number]   CT HEAD WO CONTRAST ( ) Result Date: 01/29/2024 EXAM: CT HEAD WITHOUT CONTRAST 01/29/2024 07:31:28 PM TECHNIQUE: CT of the head was performed without the administration of intravenous contrast. Automated exposure control, iterative reconstruction, and/or weight based adjustment of the mA/kV was utilized to reduce the radiation dose to as low as reasonably achievable. COMPARISON: None available. CLINICAL HISTORY: Headache, new onset (Age >= 51y). FINDINGS: BRAIN AND VENTRICLES: No acute hemorrhage. No evidence of acute infarct. Mild periventricular and deep white matter hypodensity typical of chronic small vessel ischemia. Basal gangliar mineralization, typically senescent. ORBITS: Bilateral lens resection. SINUSES: No acute abnormality. SOFT TISSUES AND SKULL: No acute soft tissue abnormality. No skull fracture. IMPRESSION: 1. Chronic ischemic changes without acute abnormality. Electronically signed by: Oneil Devonshire MD MD 01/29/2024 08:30 PM EST RP Workstation: [id number]   DG  Chest 2 View Result Date: 01/29/2024 CLINICAL DATA:  Wheezing EXAM: CHEST - 2 VIEW COMPARISON:  09/09/2023 FINDINGS: Frontal and lateral views of the chest demonstrates stable enlargement of the cardiac silhouette. There is persistent right hilar prominence, which may reflect prominence of the central vasculature. However, given progression since 2024 underlying adenopathy or mass cannot be excluded. Further evaluation with contrasted CT chest may be useful. No acute airspace disease, effusion, or pneumothorax. No acute bony abnormalities. Chronic T12 compression deformity. IMPRESSION: 1. Increased density at the right hilum. While this could reflect prominence of the central vasculature, underlying adenopathy or mass cannot  be excluded and further evaluation with nonemergent contrasted chest CT is recommended. 2. Stable enlarged cardiac silhouette. Electronically Signed   By: Ozell Daring M.D.   On: 01/29/2024 18:38    EKG: My personal interpretation of EKG shows: Sinus rhythm without any acute ST changes    Assessment/Plan Principal Problem:   Hyponatremia Active Problems:   Hypothyroidism, postsurgical   Hypertension   Mixed hyperlipidemia   Hyperlipidemia   Essential hypertension   Pt presenting with hyponatremia that is severe. Sodium is at 122. Baseline sodium is normal.  Suspect secondary to poor oral intake and chlorthalidone  use.  Serum osm, urine osm, urine Na ordered, urinalysis ordered.  Keeping patient n.p.o. until her urine studies result as there could be component of SIADH.  Goal is to correct 6-8 mill equivalents over next 24 hours.  Goal sodium by tomorrow at noon is 118.  Patient will be admitted to stepdown for close observation.  She was given 500 cc normal saline and sodium has improved to 114.  Will monitor serial sodium every 4 hours.  If next sodium is 115 or less we will start low rate lactated Ringer's at 40 cc an hour.  Nephrology consulted as well given severity of  hyponatremia.  Hypertension: Holding home medicines.  May need to discontinue chlorthalidone  and use an alternate agent.  Can use IV antihypertensive as needed.  Hyperlipidemia: Continue home statin  Hypothyroidism: Continue home Synthroid .  Will recheck TSH.  Concern for hilar adenopathy: Will recommend getting CT chest tomorrow as she has received significant contrast today.    Right wrist pain: Evaluated in outpatient setting and patient started on prednisone .  Patient is concerned she may have gout as she states it flares up with eating meat.  Discussed that getting uric acid and flare is not recommended so we will get plain radiography as it can show signs of gout.  Will continue prednisone  and defer workup to outpatient setting. VTE prophylaxis:  SQ Heparin   Diet: N.p.o. Code Status:  Full Code Telemetry:  Admission status: Observation, Step Down Unit Patient is from: Home Anticipated d/c is to: Home Anticipated d/c is in: 1-2 days   Family Communication: Updated at bedside  Consults called: Nephrology   Severity of Illness: The appropriate patient status for this patient is OBSERVATION. Observation status is judged to be reasonable and necessary in order to provide the required intensity of service to ensure the patient's safety. The patient's presenting symptoms, physical exam findings, and initial radiographic and laboratory data in the context of their medical condition is felt to place them at decreased risk for further clinical deterioration. Furthermore, it is anticipated that the patient will be medically stable for discharge from the hospital within 2 midnights of admission.    Morene Bathe, MD Jolynn DEL. [facility]     [1] No Known Allergies  "

## 2024-01-29 NOTE — Telephone Encounter (Signed)
 Sw daughter and told her the referral for Rheumatology was sent on Thurs. 01/25/24

## 2024-01-29 NOTE — Telephone Encounter (Signed)
 Hope with [facility] called with a critical lab on pt.  Pt's sodium was 112, Hope said they double checked the labs twice before calling.  Results given directly to Dr. Randeen

## 2024-01-29 NOTE — Telephone Encounter (Signed)
 Called daughter Ulla and advised her of the critically low sodium and the affects low sodium can cause. I advise that she needs to go to ER asap for eval. Daughter agreed and is going to take her to [facility] now.

## 2024-01-29 NOTE — ED Provider Notes (Signed)
 "  [facility] Provider Note    Event Date/Time   First MD Initiated Contact with Patient 01/29/24 1746     (approximate)   History   Abnormal Lab   HPI  Caili L Zendejas is a 79 y.o. female who went to her primary care doctor for muscle tightness.  I reviewed a note from today where patient was seen by family medical doctor for concern for muscle tightness.  Had also some joint pain.  Reviewed blood work sed rate was 85 CBC showed slightly elevated white count of 14 but sodium level was 112 and therefore sent in for evaluation.  Patient reports migratory joint pain and then having some groin pain today.  She is been treated for gout with prednisone .  She has been referred to rheumatology but because of the low sodium was sent here for evaluation.  They report that she is been a little bit more weak she has been eating and drinking okay they deny any new swelling.  They also do report that she has been having a headache recently.   Physical Exam   Triage Vital Signs: ED Triage Vitals  Encounter Vitals Group     BP 01/29/24 1658 130/72     Girls Systolic BP Percentile --      Girls Diastolic BP Percentile --      Boys Systolic BP Percentile --      Boys Diastolic BP Percentile --      Pulse Rate 01/29/24 1658 65     Resp 01/29/24 1658 18     Temp 01/29/24 1658 98.3 F (36.8 C)     Temp Source 01/29/24 1658 Oral     SpO2 01/29/24 1658 98 %     Weight 01/29/24 1700 174 lb 6.1 oz (79.1 kg)     Height 01/29/24 1700 5' (1.524 m)     Head Circumference --      Peak Flow --      Pain Score 01/29/24 1659 9     Pain Loc --      Pain Education --      Exclude from Growth Chart --     Most recent vital signs: Vitals:   01/29/24 1658  BP: 130/72  Pulse: 65  Resp: 18  Temp: 98.3 F (36.8 C)  SpO2: 98%     General: Awake, no distress.  CV:  Good peripheral perfusion.  Resp:  Normal effort.  Abd:  No distention.  Soft and nontender she reports a  little bit of bilateral groin pain Other:  No swelling legs Alert and oriented x 3 moving all extremities cranial nerves appear intact  ED Results / Procedures / Treatments   Labs (all labs ordered are listed, but only abnormal results are displayed) Labs Reviewed  CBC - Abnormal; Notable for the following components:      Result Value   WBC 16.1 (*)    HCT 32.9 (*)    MCHC 37.1 (*)    All other components within normal limits  COMPREHENSIVE METABOLIC PANEL WITH GFR  URINALYSIS, ROUTINE W REFLEX MICROSCOPIC     EKG  My interpretation of EKG:  Normal sinus rate of 67 without any ST elevation or T wave versions, normal intervals  RADIOLOGY I have reviewed thect personally and interpreted none intercranial hemorrhage   PROCEDURES:  Critical Care performed: No  Procedures   MEDICATIONS ORDERED IN ED: Medications  sodium chloride  0.9 % bolus 500 mL (500 mLs Intravenous New  Bag/Given 01/29/24 1843)  ondansetron  (ZOFRAN ) injection 4 mg (4 mg Intravenous Given 01/29/24 1843)  acetaminophen  (TYLENOL ) tablet 1,000 mg (1,000 mg Oral Given 01/29/24 1843)  traMADol  (ULTRAM ) tablet 50 mg (50 mg Oral Given 01/29/24 1847)  iohexol  (OMNIPAQUE ) 300 MG/ML solution 100 mL (100 mLs Intravenous Contrast Given 01/29/24 1924)     IMPRESSION / MDM / ASSESSMENT AND PLAN / ED COURSE  I reviewed the triage vital signs and the nursing notes.   Patient's presentation is most consistent with acute presentation with potential threat to life or bodily function.   Patient comes in with concerns for hyponatremia, weakness.  Patient with some migratory pain but currently in her abdomen and her head CT imaging were done to evaluate for intercranial hemorrhage, diverticulitis, hernia or other acute pathology.  CBC shows elevated white count no clear source her urine is still pending.  CMP does consist from hyponatremia will give a little bit of fluids.  Sed rate was 85.  Will discuss with hospital team  for admission due to concerns for hyponatremia as CT imaging was negative.  After CT imaging was done x-ray did show some fullness they recommended a CT of the chest.  Unfortunately patient had already had the CT with contrast.  This can be done inpatient I did alert family about need for CT chest.  The patient is on the cardiac monitor to evaluate for evidence of arrhythmia and/or significant heart rate changes.      FINAL CLINICAL IMPRESSION(S) / ED DIAGNOSES   Final diagnoses:  Hyponatremia     Rx / DC Orders   ED Discharge Orders     None        Note:  This document was prepared using Dragon voice recognition software and may include unintentional dictation errors.   Ernest Ronal BRAVO, MD 01/29/24 2045  "

## 2024-01-29 NOTE — ED Notes (Signed)
 Na + 112. MD made aware.

## 2024-01-29 NOTE — Telephone Encounter (Signed)
 That is concerning  Looks like she takes chlorthalidone  but has not been on it lately  Was here with nausea and joint pain   I recommend ER asap please   Will cc pcp

## 2024-01-30 ENCOUNTER — Inpatient Hospital Stay

## 2024-01-30 DIAGNOSIS — J189 Pneumonia, unspecified organism: Secondary | ICD-10-CM | POA: Diagnosis not present

## 2024-01-30 DIAGNOSIS — E876 Hypokalemia: Secondary | ICD-10-CM | POA: Diagnosis present

## 2024-01-30 DIAGNOSIS — E871 Hypo-osmolality and hyponatremia: Secondary | ICD-10-CM | POA: Diagnosis present

## 2024-01-30 DIAGNOSIS — J029 Acute pharyngitis, unspecified: Secondary | ICD-10-CM

## 2024-01-30 DIAGNOSIS — Z79890 Hormone replacement therapy (postmenopausal): Secondary | ICD-10-CM | POA: Diagnosis not present

## 2024-01-30 DIAGNOSIS — D72829 Elevated white blood cell count, unspecified: Secondary | ICD-10-CM | POA: Diagnosis present

## 2024-01-30 DIAGNOSIS — I129 Hypertensive chronic kidney disease with stage 1 through stage 4 chronic kidney disease, or unspecified chronic kidney disease: Secondary | ICD-10-CM | POA: Diagnosis present

## 2024-01-30 DIAGNOSIS — R54 Age-related physical debility: Secondary | ICD-10-CM | POA: Diagnosis present

## 2024-01-30 DIAGNOSIS — N182 Chronic kidney disease, stage 2 (mild): Secondary | ICD-10-CM | POA: Diagnosis present

## 2024-01-30 DIAGNOSIS — R9389 Abnormal findings on diagnostic imaging of other specified body structures: Secondary | ICD-10-CM

## 2024-01-30 DIAGNOSIS — Z905 Acquired absence of kidney: Secondary | ICD-10-CM | POA: Diagnosis not present

## 2024-01-30 DIAGNOSIS — E782 Mixed hyperlipidemia: Secondary | ICD-10-CM | POA: Diagnosis present

## 2024-01-30 DIAGNOSIS — Z79899 Other long term (current) drug therapy: Secondary | ICD-10-CM | POA: Diagnosis not present

## 2024-01-30 DIAGNOSIS — M1990 Unspecified osteoarthritis, unspecified site: Secondary | ICD-10-CM | POA: Diagnosis present

## 2024-01-30 DIAGNOSIS — E890 Postprocedural hypothyroidism: Secondary | ICD-10-CM | POA: Diagnosis present

## 2024-01-30 DIAGNOSIS — R1314 Dysphagia, pharyngoesophageal phase: Secondary | ICD-10-CM | POA: Diagnosis present

## 2024-01-30 DIAGNOSIS — K5900 Constipation, unspecified: Secondary | ICD-10-CM | POA: Diagnosis not present

## 2024-01-30 LAB — CBC
HCT: 30.1 % — ABNORMAL LOW (ref 36.0–46.0)
Hemoglobin: 11 g/dL — ABNORMAL LOW (ref 12.0–15.0)
MCH: 31.2 pg (ref 26.0–34.0)
MCHC: 36.5 g/dL — ABNORMAL HIGH (ref 30.0–36.0)
MCV: 85.3 fL (ref 80.0–100.0)
Platelets: 207 K/uL (ref 150–400)
RBC: 3.53 MIL/uL — ABNORMAL LOW (ref 3.87–5.11)
RDW: 12.2 % (ref 11.5–15.5)
WBC: 14 K/uL — ABNORMAL HIGH (ref 4.0–10.5)
nRBC: 0 % (ref 0.0–0.2)

## 2024-01-30 LAB — BASIC METABOLIC PANEL WITH GFR
Anion gap: 9 (ref 5–15)
BUN: 13 mg/dL (ref 8–23)
CO2: 24 mmol/L (ref 22–32)
Calcium: 8.3 mg/dL — ABNORMAL LOW (ref 8.9–10.3)
Chloride: 80 mmol/L — ABNORMAL LOW (ref 98–111)
Creatinine, Ser: 0.73 mg/dL (ref 0.44–1.00)
GFR, Estimated: >60 mL/min
Glucose, Bld: 94 mg/dL (ref 70–99)
Potassium: 3.2 mmol/L — ABNORMAL LOW (ref 3.5–5.1)
Sodium: 114 mmol/L — CL (ref 135–145)

## 2024-01-30 LAB — RESP PANEL BY RT-PCR (RSV, FLU A&B, COVID)  RVPGX2
Influenza A by PCR: NEGATIVE
Influenza B by PCR: NEGATIVE
Resp Syncytial Virus by PCR: NEGATIVE
SARS Coronavirus 2 by RT PCR: NEGATIVE

## 2024-01-30 LAB — OSMOLALITY, URINE: Osmolality, Ur: 628 mosm/kg (ref 300–900)

## 2024-01-30 LAB — SODIUM
Sodium: 114 mmol/L — CL (ref 135–145)
Sodium: 114 mmol/L — CL (ref 135–145)
Sodium: 116 mmol/L — CL (ref 135–145)
Sodium: 117 mmol/L — CL (ref 135–145)
Sodium: 119 mmol/L — CL (ref 135–145)

## 2024-01-30 LAB — OSMOLALITY
Osmolality: 240 mosm/kg — CL (ref 275–295)
Osmolality: 240 mosm/kg — CL (ref 275–295)

## 2024-01-30 LAB — MAGNESIUM
Magnesium: 1.5 mg/dL — ABNORMAL LOW (ref 1.7–2.4)
Magnesium: 1.6 mg/dL — ABNORMAL LOW (ref 1.7–2.4)

## 2024-01-30 LAB — SODIUM, URINE, RANDOM
Sodium, Ur: <30 mmol/L
Sodium, Ur: <30 mmol/L

## 2024-01-30 LAB — TSH: TSH: 9.05 u[IU]/mL — ABNORMAL HIGH (ref 0.350–4.500)

## 2024-01-30 MED ORDER — MORPHINE SULFATE (PF) 2 MG/ML IV SOLN
2.0000 mg | INTRAVENOUS | Status: DC | PRN
  Administered 2024-01-31 (×3): 2 mg via INTRAVENOUS
  Filled 2024-01-30 (×3): qty 1

## 2024-01-30 MED ORDER — LACTATED RINGERS IV SOLN: INTRAVENOUS | Status: DC

## 2024-01-30 MED ORDER — ATENOLOL 25 MG PO TABS
25.0000 mg | ORAL_TABLET | Freq: Every day | ORAL | Status: DC
  Administered 2024-01-30 – 2024-02-01 (×3): 25 mg via ORAL
  Filled 2024-01-30 (×4): qty 1

## 2024-01-30 MED ORDER — IPRATROPIUM-ALBUTEROL 0.5-2.5 (3) MG/3ML IN SOLN
3.0000 mL | Freq: Four times a day (QID) | RESPIRATORY_TRACT | Status: DC
  Administered 2024-01-30 – 2024-02-02 (×11): 3 mL via RESPIRATORY_TRACT
  Filled 2024-01-30 (×11): qty 3

## 2024-01-30 MED ORDER — PHENOL 1.4 % MT LIQD: 1.0000 | OROMUCOSAL | Status: DC | PRN

## 2024-01-30 MED ORDER — CALCIUM CARBONATE 1250 (500 CA) MG PO TABS
1.0000 | ORAL_TABLET | Freq: Two times a day (BID) | ORAL | Status: DC
  Administered 2024-01-30 – 2024-02-02 (×6): 1250 mg via ORAL
  Filled 2024-01-30 (×6): qty 1

## 2024-01-30 MED ORDER — MAGNESIUM SULFATE 4 GM/100ML IV SOLN
4.0000 g | Freq: Once | INTRAVENOUS | Status: AC
  Administered 2024-01-30: 4 g via INTRAVENOUS
  Filled 2024-01-30: qty 100

## 2024-01-30 MED ORDER — LEVOTHYROXINE SODIUM 50 MCG PO TABS
75.0000 ug | ORAL_TABLET | Freq: Every day | ORAL | Status: DC
  Administered 2024-01-31 – 2024-02-02 (×3): 75 ug via ORAL
  Filled 2024-01-30: qty 2
  Filled 2024-01-30 (×2): qty 1

## 2024-01-30 MED ORDER — IOHEXOL 300 MG/ML  SOLN
75.0000 mL | Freq: Once | INTRAMUSCULAR | Status: AC | PRN
  Administered 2024-01-30: 75 mL via INTRAVENOUS

## 2024-01-30 MED ORDER — SODIUM CHLORIDE 3 % IV SOLN
INTRAVENOUS | Status: DC
  Filled 2024-01-30 (×2): qty 500

## 2024-01-30 MED ORDER — LOSARTAN POTASSIUM 50 MG PO TABS
25.0000 mg | ORAL_TABLET | Freq: Every day | ORAL | Status: DC
  Administered 2024-01-30 – 2024-02-02 (×4): 25 mg via ORAL
  Filled 2024-01-30 (×4): qty 1

## 2024-01-30 MED ORDER — POTASSIUM CHLORIDE 20 MEQ PO PACK
40.0000 meq | PACK | Freq: Once | ORAL | Status: AC
  Administered 2024-01-30: 40 meq via ORAL
  Filled 2024-01-30: qty 2

## 2024-01-30 MED ORDER — ATORVASTATIN CALCIUM 20 MG PO TABS
10.0000 mg | ORAL_TABLET | Freq: Every day | ORAL | Status: DC
  Administered 2024-01-30 – 2024-02-02 (×4): 10 mg via ORAL
  Filled 2024-01-30 (×4): qty 1

## 2024-01-30 NOTE — Progress Notes (Signed)
" ° °  Brief Progress Note   _____________________________________________________________________________________________________________  Patient Name: Rachel Quinn Patient DOB: March 08, 1945 Date: @TODAY @      Data: Reviewed labs, notes, VS.      Action: No action needed at this time.      Response:    _____________________________________________________________________________________________________________  [facility] RN Expeditor Sharolyn JONETTA Batman Please contact us  directly via secure chat (search for [facility]) or by calling us  at [phone] [facility]).  "

## 2024-01-30 NOTE — Consult Note (Addendum)
 Pharmacy Consulted for monitoring of Hypertonic Saline  Rachel Quinn is a 49 yoF presenting with concerns for hyponatremia. Past medical history notable for  HTN, HLD,  and hypothyroidism. Patient arrived following an appointment with PCP with concerns for muscle tightness. Was recommended to go to the ER for evaluation following resultant sodium level 112. Per PCP note, no apparent concerns for AMS or neurologic deficits. Based on fill history, patient takes atenolol -chlorthalidone . Recent baseline sodium in low 130s.  MIVF:  NaCL 3% @ 43ml/h started at 1029 on 1/13  Goal of Therapy:  Goal of correction per 24 hours IF sodium rises > 4 mEq/L over 2 hours   > 6 mEq/L over 4 hours - Pharmacy will evaluate and contact MD per consult.  Date Time Na  1/13 1023 114    Plan:  MIVF NaCL 3% order active since 1029 (started on 1/13). Na: 114 CTM labs q2h x2; then q4h thereafter.   Leonor Argyle, PharmD 01/30/2024

## 2024-01-30 NOTE — Consult Note (Addendum)
 Pharmacy Consulted for monitoring of Hypertonic Saline  Rachel Quinn is a 66 yoF presenting with concerns for hyponatremia. Past medical history notable for  HTN, HLD,  and hypothyroidism. Patient arrived following an appointment with PCP with concerns for muscle tightness. Was recommended to go to the ER for evaluation following resultant sodium level 112. Per PCP note, no apparent concerns for AMS or neurologic deficits. Based on fill history, patient takes atenolol -chlorthalidone . Recent baseline sodium in low 130s.  MIVF:  NaCL 3% @ 66ml/h started at 1029 on 1/13  Goal of Therapy:  Goal of correction per 24 hours IF sodium rises > 4 mEq/L over 2 hours   > 6 mEq/L over 4 hours - Pharmacy will evaluate and contact MD per consult.  Date Time Na  1/13 1023 114  1/13 1239 116  1/13 1615 117   Plan:  MIVF NaCL 3% order active since 1029 (started on 1/13). Continue to monitor labs q4h  Leonor JAYSON Argyle, PharmD Pharmacy Resident  01/30/2024 5:27 PM

## 2024-01-30 NOTE — ED Notes (Signed)
 Pt alert, NAD, calm, interactive, mag and 3%Nacl infusing. C/o R hand pain. Neb complete. 96-98% on RA. Denies pain, sob, nausea or dizziness. Family at [facility].

## 2024-01-30 NOTE — Assessment & Plan Note (Signed)
 Patient with having arthritic like pain involving different joints.  She was recently referred to see a rheumatologist by her PCP.  Uric acid was tested by PCP and it was normal.  Right wrist imaging was done on admission for concern of some swelling and it was negative for any osseous abnormality. - Outpatient rheumatology evaluation -Supportive care

## 2024-01-30 NOTE — Assessment & Plan Note (Signed)
 Blood pressure currently within goal.  Patient was on a combination pill containing atenolol  and chlorthalidone  at home.  Chlorthalidone  was discontinued. - Will continue with atenolol  -Starting on low-dose losartan  -Continue to monitor

## 2024-01-30 NOTE — Consult Note (Signed)
 Pharmacy Consulted for monitoring of Hypertonic Saline  Rachel Quinn is a 72 yoF presenting with concerns for hyponatremia. Past medical history notable for  HTN, HLD,  and hypothyroidism. Patient arrived following an appointment with PCP with concerns for muscle tightness. Was recommended to go to the ER for evaluation following resultant sodium level 112. Per PCP note, no apparent concerns for AMS or neurologic deficits. Based on fill history, patient takes atenolol -chlorthalidone . Recent baseline sodium in low 130s.  MIVF:  NaCL 3% @ 60ml/h started at 1029 on 1/13  Goal of Therapy:  Goal of correction per 24 hours IF sodium rises > 4 mEq/L over 2 hours   > 6 mEq/L over 4 hours - Pharmacy will evaluate and contact MD per consult.  Date Time Na  1/13 1023 114  1/13 1239 116  1/13 1615 117  1/13 1959 119       Plan:  NA increased only in 9 hrs since infusion started. Continue to monitor labs q4h  Rachel Quinn PharmD, BCPS 01/30/2024 9:16 PM

## 2024-01-30 NOTE — Hospital Course (Addendum)
 Partly taken from H&P.  Rachel Quinn is a 79 y.o. year old female with medical history of HTN, HLD, hypothyroidism presenting to the ED after rheumatology office noted pt with severely low sodium.  Patient reports not eating well over the last several days and states she has been taking her medications regularly.   On presentation vital stable, labs with leukocytosis 16.1, sodium of 112, potassium 3.5, bilirubin at 2.2. CT abdomen and pelvis with no acute abnormality. Chest x-ray with increased density at the right hilum with recommendation to do CT chest.  Hyponatremia labs ordered and patient was given some normal saline.  1/13: Vital stable, sodium remained at 114 on previous 3 checks, potassium of 3.2, some improvement in leukocytosis to 14 but all cell lines decreased.  Hyponatremia labs with hypoosmolar hyponatremia with serum osmolality low at 240, normal urine osmolality and urine sodium less than 30.  Patient is being started on hypertonic saline. CT chest was also ordered due to abnormal x-ray. Nephrology was consulted.  She was also on chlorthalidone  which should be discontinued.

## 2024-01-30 NOTE — Assessment & Plan Note (Signed)
 Check TSH and continue home Synthroid

## 2024-01-30 NOTE — Progress Notes (Signed)
 PT Cancellation Note  Patient Details Name: Rachel Quinn MRN: [id number] DOB: 07-Mar-1945   Cancelled Treatment:    Reason Eval/Treat Not Completed: Medical issues which prohibited therapy (Consult received and chart reviewed. Patient noted with critically low sodium levels. Per attending, advises hold at this time, citing plans to manage with hypertonic saline. Will continue to follow and initiate as medically appropriate)  Tejal Monroy H. Delores, PT, DPT, NCS 01/30/2024, 9:26 AM [phone]

## 2024-01-30 NOTE — Consult Note (Addendum)
 Pharmacy Consulted for monitoring of Hypertonic Saline  Rachel Quinn is a 80 yoF presenting with concerns for hyponatremia. Past medical history notable for  HTN, HLD,  and hypothyroidism. Patient arrived following an appointment with PCP with concerns for muscle tightness. Was recommended to go to the ER for evaluation following resultant sodium level 112. Per PCP note, no apparent concerns for AMS or neurologic deficits. Based on fill history, patient takes atenolol -chlorthalidone . Recent baseline sodium in low 130s.  MIVF:  NaCL 3% @ 46ml/h started at 1029 on 1/13  Goal of Therapy:  Goal of correction per 24 hours IF sodium rises > 4 mEq/L over 2 hours   > 6 mEq/L over 4 hours - Pharmacy will evaluate and contact MD per consult.  Date Time Na  1/13 1023 114  1/13 1239 116   Plan:  MIVF NaCL 3% order active since 1029 (started on 1/13). CTM labs q2h x2, then q4h thereafter  Leonor Argyle, PharmD 01/30/2024

## 2024-01-30 NOTE — Assessment & Plan Note (Signed)
 Magnesium  at 1.6. - Place magnesium  and monitor

## 2024-01-30 NOTE — Progress Notes (Signed)
 " Progress Note   Patient: Rachel Quinn FMW:[id number] DOB: [birth year] DOA: 01/29/2024     0 DOS: the patient was seen and examined on 01/30/2024   Brief hospital course: Partly taken from H&P.  Rachel Quinn is a 79 y.o. year old female with medical history of HTN, HLD, hypothyroidism presenting to the ED after rheumatology office noted pt with severely low sodium.  Patient reports not eating well over the last several days and states she has been taking her medications regularly.   On presentation vital stable, labs with leukocytosis 16.1, sodium of 112, potassium 3.5, bilirubin at 2.2. CT abdomen and pelvis with no acute abnormality. Chest x-ray with increased density at the right hilum with recommendation to do CT chest.  Hyponatremia labs ordered and patient was given some normal saline.  1/13: Vital stable, sodium remained at 114 on previous 3 checks, potassium of 3.2, some improvement in leukocytosis to 14 but all cell lines decreased.  Hyponatremia labs with hypoosmolar hyponatremia with serum osmolality low at 240, normal urine osmolality and urine sodium less than 30.  Patient is being started on hypertonic saline. CT chest was also ordered due to abnormal x-ray. Nephrology was consulted.  She was also on chlorthalidone  which should be discontinued.  Assessment and Plan: * Hyponatremia Persistent hyponatremia with sodium at 114 so patient is being started on hypertonic saline.  Hyponatremia labs consistent with hypovolemic hyponatremia. Nephrology was consulted. Discontinue home chlorthalidone  as it can be contributory. CT chest was also ordered due to abnormal chest x-ray to rule out any underlying lung condition to cause SIADH -Monitor sodium-goal increase 6-8 in 24 hours  Essential hypertension Blood pressure currently within goal.  Patient was on a combination pill containing atenolol  and chlorthalidone  at home.  Chlorthalidone  was discontinued. - Will  continue with atenolol  -Starting on low-dose losartan  -Continue to monitor  Hypokalemia Mild hypokalemia with hypomagnesemia. -Replace potassium and monitor  Hypomagnesemia Magnesium  at 1.6. - Place magnesium  and monitor  Hyperlipidemia - Continue home atorvastatin   Hypothyroidism, postsurgical - Check TSH and continue home Synthroid   Abnormal chest x-ray CT chest was ordered as recommended by radiology as chest x-ray with concern of some right hilum density. - Follow-up CT chest  Arthritic-like pain Patient with having arthritic like pain involving different joints.  She was recently referred to see a rheumatologist by her PCP.  Uric acid was tested by PCP and it was normal.  Right wrist imaging was done on admission for concern of some swelling and it was negative for any osseous abnormality. - Outpatient rheumatology evaluation -Supportive care  Sore throat Patient has developing some sore throat and early signs of congestion. -Respiratory panel was ordered -Supportive care   Subjective: Patient was complaining of joint aches and pains and with some intermittent swelling for which she was recently referred to see a rheumatologist but has not seen them yet.  She was feeling weak and having some sore throat.  Physical Exam: Vitals:   01/30/24 0730 01/30/24 0800 01/30/24 0849 01/30/24 1245  BP: 120/62 130/60  131/72  Pulse:    80  Resp: 17 20  19   Temp:   98.5 F (36.9 C) 98.4 F (36.9 C)  TempSrc:   Oral Oral  SpO2:    98%  Weight:      Height:       General.  Frail and obese elderly lady, in no acute distress. Pulmonary.  Lungs clear bilaterally, normal respiratory effort. CV.  Regular rate and  rhythm, no JVD, rub or murmur. Abdomen.  Soft, nontender, nondistended, BS positive. CNS.  Alert and oriented .  No focal neurologic deficit. Extremities.  No edema, no cyanosis, pulses intact and symmetrical.  Data Reviewed: Prior data reviewed  Family  Communication: Discussed with daughter at bedside  Disposition: Status is: Inpatient Remains inpatient appropriate because: Severity of illness  Planned Discharge Destination: Home with Home Health  DVT prophylaxis.  Lovenox  Time spent: 50 minutes  This record has been created using Conservation officer, historic buildings. Errors have been sought and corrected,but may not always be located. Such creation errors do not reflect on the standard of care.   Author: Amaryllis Dare, MD 01/30/2024 1:34 PM  For on call review [email or web address].  "

## 2024-01-30 NOTE — Assessment & Plan Note (Signed)
 Continue home atorvastatin 

## 2024-01-30 NOTE — Assessment & Plan Note (Signed)
 Mild hypokalemia with hypomagnesemia. -Replace potassium and monitor

## 2024-01-30 NOTE — Consult Note (Signed)
 " [facility]  CONSULT NOTE    Date: 01/30/2024                  Patient Name:  Rachel Quinn  MRN: [id number]  DOB: 10-Jul-1945  Age / Sex: 79 y.o., female         PCP: Wendee Lynwood HERO, NP                 Service Requesting Consult: TRH                 Reason for Consult: Hyponatremia             History of Present Illness: Ms. Rachel Quinn is a 79 y.o.  female with past medical history including hypertension, hypothyroidism, hyperlipidemia, who was admitted to [facility] on 01/29/2024 for Hyponatremia [E87.1]  Patient presents to the emergency department with her daughter due to abnormal labs.  Patient is very drowsy during ED visit.  Daughter is able to assist with history.  States patient was in her usual state of health last week.  Has experienced decreased appetite and mobility over the past few days.  Has continued to take all medications as prescribed.  Denies pain or discomfort at this time.  Room air.  Trace lower extremity edema  Labs on ED arrival concerning for sodium 112.  During visit, patient had lactated Ringer's infusing.  Patient has received normal saline bolus in emergency department as well.  Very little response to sodium as it increased to 114.  Appears patient prescribed chlorthalidone  outpatient.   Medications: Outpatient medications: (Not in a hospital admission)   Current medications: Current Facility-Administered Medications  Medication Dose Route Frequency Provider Last Rate Last Admin   acetaminophen  (TYLENOL ) tablet 650 mg  650 mg Oral Q6H PRN Fernand Prost, MD       Or   acetaminophen  (TYLENOL ) suppository 650 mg  650 mg Rectal Q6H PRN Fernand Prost, MD       atenolol  (TENORMIN ) tablet 25 mg  25 mg Oral Daily Amin, Sumayya, MD       atorvastatin  (LIPITOR) tablet 10 mg  10 mg Oral Daily Amin, Sumayya, MD       calcium  carbonate (OS-CAL - dosed in mg of elemental calcium ) tablet 1,250 mg  1 tablet Oral BID WC Amin,  Sumayya, MD       heparin  injection 5,000 Units  5,000 Units Subcutaneous Q8H Khan, Ghalib, MD   5,000 Units at 01/30/24 9479   ipratropium-albuterol  (DUONEB) 0.5-2.5 (3) MG/3ML nebulizer solution 3 mL  3 mL Nebulization Q6H Amin, Sumayya, MD       [START ON 01/31/2024] levothyroxine  (SYNTHROID ) tablet 75 mcg  75 mcg Oral QAC breakfast Amin, Sumayya, MD       losartan  (COZAAR ) tablet 25 mg  25 mg Oral Daily Amin, Sumayya, MD       magnesium  sulfate IVPB 4 g 100 mL  4 g Intravenous Once Amin, Sumayya, MD       ondansetron  (ZOFRAN ) tablet 4 mg  4 mg Oral Q6H PRN Fernand Prost, MD       Or   ondansetron  (ZOFRAN ) injection 4 mg  4 mg Intravenous Q6H PRN Khan, Ghalib, MD       phenol (CHLORASEPTIC) mouth spray 1 spray  1 spray Mouth/Throat PRN Caleen Qualia, MD       senna-docusate (Senokot-S) tablet 1 tablet  1 tablet Oral QHS PRN Fernand Prost, MD  sodium chloride  (hypertonic) 3 % solution   Intravenous Continuous Druscilla Bald, NP 30 mL/hr at 01/30/24 1029 New Bag at 01/30/24 1029   sodium chloride  flush (NS) 0.9 % injection 3 mL  3 mL Intravenous Q12H Khan, Ghalib, MD   3 mL at 01/29/24 2239   Current Outpatient Medications  Medication Sig Dispense Refill   acetaminophen  (TYLENOL ) 500 MG tablet Take 500 mg by mouth every 6 (six) hours as needed.     albuterol  (VENTOLIN  HFA) 108 (90 Base) MCG/ACT inhaler Inhale 2 puffs into the lungs every 4 (four) hours as needed for wheezing or shortness of breath. 18 g 0   atenolol -chlorthalidone  (TENORETIC ) 50-25 MG tablet Take 1 tablet by mouth daily. 90 tablet 1   atorvastatin  (LIPITOR) 10 MG tablet Take 1 tablet by mouth once daily 90 tablet 1   calcium  carbonate (OS-CAL - DOSED IN MG OF ELEMENTAL CALCIUM ) 1250 (500 Ca) MG tablet Take 1 tablet by mouth.     levothyroxine  (SYNTHROID ) 75 MCG tablet Take 1 tablet (75 mcg total) by mouth daily. 90 tablet 1   potassium citrate  (UROCIT-K ) 10 MEQ (1080 MG) SR tablet Take 1 tablet by mouth once daily 90  tablet 1   predniSONE  (DELTASONE ) 10 MG tablet Take 4 pills once daily by mouth for 3 days, then 3 pills daily for 3 days, then 2 pills daily for 3 days then 1 pill daily for 3 days then stop 30 tablet 0   tiZANidine  (ZANAFLEX ) 2 MG tablet Take 1 tablet (2 mg total) by mouth at bedtime as needed for muscle spasms. 30 tablet 0   traMADol  (ULTRAM ) 50 MG tablet Take 1 tablet (50 mg total) by mouth every 6 (six) hours as needed. 20 tablet 0      Allergies: Allergies[1]    Past Medical History: Past Medical History:  Diagnosis Date   Cataract    Chronic kidney disease    Heart murmur    Hypertension    Kidney stone    Thyroid  disease      Past Surgical History: Past Surgical History:  Procedure Laterality Date   CATARACT EXTRACTION Bilateral 01/2020   KIDNEY SURGERY  2000   KNEE ARTHROSCOPY Left 09/10/2023   Procedure: ARTHROSCOPY, KNEE;  Surgeon: Lorelle Hussar, MD;  Location: [facility] ORS;  Service: Orthopedics;  Laterality: Left;   NEPHRECTOMY Left 01/18/1999   2/2 nephrolithiasis   THYROIDECTOMY  01/17/2002   TUBAL LIGATION       Family History: Family History  Problem Relation Age of Onset   Healthy Mother    Healthy Father    Cancer Neg Hx    Heart attack Neg Hx    Stroke Neg Hx    Colon cancer Neg Hx    Esophageal cancer Neg Hx    Rectal cancer Neg Hx    Stomach cancer Neg Hx      Social History: Social History   Socioeconomic History   Marital status: Married    Spouse name: Not on file   Number of children: 9   Years of education: college   Highest education level: Not on file  Occupational History   Not on file  Tobacco Use   Smoking status: Never   Smokeless tobacco: Never  Vaping Use   Vaping status: Never Used  Substance and Sexual Activity   Alcohol use: No    Alcohol/week: 0.0 standard drinks of alcohol   Drug use: No   Sexual activity: Not Currently  Other Topics Concern  Not on file  Social History Narrative   ** Merged History  Encounter **       Lives with son Elene) and his family From [place], moved to the [place]  20 years ago Enjoys: spending time with family, church Sunday, 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 Exercise: taking care of her granddaughter (56 years old), walking daily Diet:    rice, fish, chicken, veggies, fruit   Social Drivers of Health   Tobacco Use: Low Risk (01/29/2024)   Patient History    Smoking Tobacco Use: Never    Smokeless Tobacco Use: Never    Passive Exposure: Not on file  Financial Resource Strain: Low Risk (10/04/2023)   Overall Financial Resource Strain (CARDIA)    Difficulty of Paying Living Expenses: Not hard at all  Food Insecurity: No Food Insecurity (10/04/2023)   Epic    Worried About Programme Researcher, Broadcasting/film/video in the Last Year: Never true    Ran Out of Food in the Last Year: Never true  Transportation Needs: No Transportation Needs (10/04/2023)   Epic    Lack of Transportation (Medical): No    Lack of Transportation (Non-Medical): No  Physical Activity: Sufficiently Active (10/04/2023)   Exercise Vital Sign    Days of Exercise per Week: 6 days    Minutes of Exercise per Session: 30 min  Stress: No Stress Concern Present (10/04/2023)   [employer] of Occupational Health - Occupational Stress Questionnaire    Feeling of Stress: Not at all  Social Connections: Socially Integrated (10/04/2023)   Social Connection and Isolation Panel    Frequency of Communication with Friends and Family: More than three times a week    Frequency of Social Gatherings with Friends and Family: More than three times a week    Attends Religious Services: More than 4 times per year    Active Member of [employer] or Organizations: Yes    Attends Banker Meetings: 1 to 4 times per year    Marital Status: Married  Catering Manager Violence: Not At Risk (10/04/2023)   Epic    Fear of Current or Ex-Partner: No    Emotionally Abused: No    Physically Abused: No    Sexually Abused: No   Depression (PHQ2-9): Low Risk (01/29/2024)   Depression (PHQ2-9)    PHQ-2 Score: 0  Alcohol Screen: Low Risk (10/04/2023)   Alcohol Screen    Last Alcohol Screening Score (AUDIT): 0  Housing: Low Risk (10/04/2023)   Epic    Unable to Pay for Housing in the Last Year: No    Number of Times Moved in the Last Year: 0    Homeless in the Last Year: No  Utilities: Not At Risk (10/04/2023)   Epic    Threatened with loss of utilities: No  Health Literacy: Inadequate Health Literacy (10/04/2023)   B1300 Health Literacy    Frequency of need for help with medical instructions: Sometimes     Review of Systems: ROS  Vital Signs: Blood pressure 131/72, pulse 80, temperature 98.4 F (36.9 C), temperature source Oral, resp. rate 19, height 5' (1.524 m), weight 79.1 kg, SpO2 98%.  Weight trends: Filed Weights   01/29/24 1700  Weight: 79.1 kg    Physical Exam: General: NAD  Head: Normocephalic, atraumatic. Moist oral mucosal membranes  Eyes: Anicteric  Lungs:  Clear to auscultation, normal effort  Heart: Regular rate and rhythm  Abdomen:  Soft, nontender  Extremities:  Trace peripheral edema.  Neurologic: Alert and oriented  Skin: No lesions        Lab results: Basic Metabolic Panel: Recent Labs  Lab 01/29/24 1248 01/29/24 1702 01/29/24 2137 01/30/24 0050 01/30/24 0457 01/30/24 1023  NA 112* 112*   < > 114* 114* 114*  K 3.4* 3.5  --   --  3.2*  --   CL 77* 77*  --   --  80*  --   CO2 27 24  --   --  24  --   GLUCOSE 121* 107*  --   --  94  --   BUN 14 13  --   --  13  --   CREATININE 0.67 0.66  --   --  0.73  --   CALCIUM  8.9 9.2  --   --  8.3*  --   MG  --   --   --  1.5*  --  1.6*   < > = values in this interval not displayed.    Liver Function Tests: Recent Labs  Lab 01/29/24 1702  AST 21  ALT 14  ALKPHOS 83  BILITOT 2.2*  PROT 7.1  ALBUMIN 3.5   No results for input(s): LIPASE, AMYLASE in the last 168 hours. No results for input(s): AMMONIA in the  last 168 hours.  CBC: Recent Labs  Lab 01/29/24 1248 01/29/24 1702 01/30/24 0457  WBC 14.5* 16.1* 14.0*  NEUTROABS 12.8*  --   --   HGB 12.4 12.2 11.0*  HCT 35.2* 32.9* 30.1*  MCV 88.6 84.1 85.3  PLT 252.0 237 207    Cardiac Enzymes: Recent Labs  Lab 01/29/24 2137  CKTOTAL 88    BNP: Invalid input(s): POCBNP  CBG: No results for input(s): GLUCAP in the last 168 hours.  Microbiology: Results for orders placed or performed during the hospital encounter of 09/09/23  Body fluid culture w Gram Stain     Status: None   Collection Time: 09/09/23  6:52 PM   Specimen: Synovial, Left Knee; Body Fluid  Result Value Ref Range Status   Specimen Description   Final    Synov, Knee Performed at [facility], [address] [place], [place] [address]    Special Requests   Final    NONE Performed at [facility], [address] [place], [place] [address]    Gram Stain   Final    WBC PRESENT,BOTH PMN AND MONONUCLEAR NO ORGANISMS SEEN CYTOSPIN SMEAR    Culture   Final    NO GROWTH 3 DAYS Performed at [facility], [address] [place], [place] [address]    Report Status 09/13/2023 FINAL  Final  Resp panel by RT-PCR (RSV, Flu A&B, Covid) Anterior Nasal Swab     Status: Abnormal   Collection Time: 09/09/23  7:29 PM   Specimen: Anterior Nasal Swab  Result Value Ref Range Status   SARS Coronavirus 2 by RT PCR POSITIVE (A) NEGATIVE Final    Comment: (NOTE) SARS-CoV-2 target nucleic acids are DETECTED.  The SARS-CoV-2 RNA is generally detectable in upper respiratory specimens during the acute phase of infection. Positive results are indicative of the presence of the identified virus, but do not rule out bacterial infection or co-infection with other pathogens not detected by the test. Clinical correlation with patient history and other diagnostic information is necessary to determine patient infection status. The expected result is  Negative.  Fact Sheet for Patients: bloggercourse.com  Fact Sheet for Healthcare Providers: seriousbroker.it  This test is not  yet approved or cleared by the [place]  FDA and  has been authorized for detection and/or diagnosis of SARS-CoV-2 by FDA under an Emergency Use Authorization (EUA).  This EUA will remain in effect (meaning this test can be used) for the duration of  the COVID-19 declaration under Section 564(b)(1) of the A ct, 21 U.S.C. section 360bbb-3(b)(1), unless the authorization is terminated or revoked sooner.     Influenza A by PCR NEGATIVE NEGATIVE Final   Influenza B by PCR NEGATIVE NEGATIVE Final    Comment: (NOTE) The Xpert Xpress SARS-CoV-2/FLU/RSV plus assay is intended as an aid in the diagnosis of influenza from Nasopharyngeal swab specimens and should not be used as a sole basis for treatment. Nasal washings and aspirates are unacceptable for Xpert Xpress SARS-CoV-2/FLU/RSV testing.  Fact Sheet for Patients: bloggercourse.com  Fact Sheet for Healthcare Providers: seriousbroker.it  This test is not yet approved or cleared by the [place]  FDA and has been authorized for detection and/or diagnosis of SARS-CoV-2 by FDA under an Emergency Use Authorization (EUA). This EUA will remain in effect (meaning this test can be used) for the duration of the COVID-19 declaration under Section 564(b)(1) of the Act, 21 U.S.C. section 360bbb-3(b)(1), unless the authorization is terminated or revoked.     Resp Syncytial Virus by PCR NEGATIVE NEGATIVE Final    Comment: (NOTE) Fact Sheet for Patients: bloggercourse.com  Fact Sheet for Healthcare Providers: seriousbroker.it  This test is not yet approved or cleared by the [place]  FDA and has been authorized for detection and/or diagnosis of SARS-CoV-2  by FDA under an Emergency Use Authorization (EUA). This EUA will remain in effect (meaning this test can be used) for the duration of the COVID-19 declaration under Section 564(b)(1) of the Act, 21 U.S.C. section 360bbb-3(b)(1), unless the authorization is terminated or revoked.  Performed at [facility], [address] [place], [place] [address]   Body fluid culture w Gram Stain     Status: None   Collection Time: 09/10/23 12:33 PM   Specimen: Synovial, Left Knee; Body Fluid  Result Value Ref Range Status   Specimen Description   Final    SYNOVIAL Performed at [facility], [address], [place], [place] [address]    Special Requests   Final    SYN LEFT KNEE ID A Performed at [facility], [address] [place], [place] [address]    Gram Stain   Final    RARE WBC PRESENT, PREDOMINANTLY PMN NO ORGANISMS SEEN    Culture   Final    NO GROWTH 3 DAYS Performed at [facility], [address], [place], [place] [address]    Report Status 09/14/2023 FINAL  Final  Body fluid culture w Gram Stain     Status: None   Collection Time: 09/10/23 12:39 PM   Specimen: Synovial, Left Knee; Body Fluid  Result Value Ref Range Status   Specimen Description   Final    SYNOVIAL Performed at [facility], [address] [place], [place] [address]    Special Requests   Final    SYN LEFT KNEE ID B Performed at [facility], [address] [place], [place] [address]    Gram Stain   Final    RARE WBC PRESENT, PREDOMINANTLY PMN NO ORGANISMS SEEN    Culture   Final    NO GROWTH 3 DAYS Performed at [facility], [address] [place], [place] [address]  Report Status 09/14/2023 FINAL  Final    Coagulation Studies: No results for input(s): LABPROT, INR in the last 72 hours.  Urinalysis: Recent Labs    01/29/24 2137  COLORURINE YELLOW*  LABSPEC >1.046*  PHURINE 7.0  GLUCOSEU NEGATIVE  HGBUR NEGATIVE   BILIRUBINUR NEGATIVE  KETONESUR NEGATIVE  PROTEINUR NEGATIVE  NITRITE NEGATIVE  LEUKOCYTESUR NEGATIVE      Imaging: DG Wrist Complete Right Result Date: 01/29/2024 CLINICAL DATA:  Wrist erythema EXAM: DG WRIST COMPLETE 3+V*R* COMPARISON:  12/09/2023 FINDINGS: No fracture or malalignment. Soft tissue edema without emphysema. Vascular calcifications IMPRESSION: No acute osseous abnormality. Electronically Signed   By: Luke Bun M.D.   On: 01/29/2024 22:30   CT ABDOMEN PELVIS W CONTRAST Result Date: 01/29/2024 EXAM: CT ABDOMEN AND PELVIS WITH CONTRAST 01/29/2024 07:31:28 PM TECHNIQUE: CT of the abdomen and pelvis was performed with the administration of 100 mL of iohexol  (OMNIPAQUE ) 300 MG/ML solution. Multiplanar reformatted images are provided for review. Automated exposure control, iterative reconstruction, and/or weight-based adjustment of the mA/kV was utilized to reduce the radiation dose to as low as reasonably achievable. COMPARISON: None available. CLINICAL HISTORY: Abdominal pain, acute, nonlocalized. FINDINGS: LOWER CHEST: Lung bases demonstrate mild right lower lobe atelectatic changes. Some scarring in the left lung base is noted as well. LIVER: The liver is unremarkable. GALLBLADDER AND BILE DUCTS: Gallbladder is unremarkable. No biliary ductal dilatation. SPLEEN: No acute abnormality. PANCREAS: No acute abnormality. ADRENAL GLANDS: The adrenal glands are within normal limits. KIDNEYS, URETERS AND BLADDER: The left kidney has been surgically removed. The right kidney is mildly lobulated. No calculi or obstructive changes are noted in the right kidney. The bladder is well distended. GI AND BOWEL: Stomach and small bowel are unremarkable. No obstructive or inflammatory changes of the colon are seen. Mild diverticular changes noted without diverticulitis. The appendix is within normal limits. PERITONEUM AND RETROPERITONEUM: No ascites. No free air. VASCULATURE: Aorta is normal in caliber.  Aortic calcifications are noted without aneurysmal dilatation. LYMPH NODES: No lymphadenopathy. REPRODUCTIVE ORGANS: The uterus is within normal limits for the patient's age. No adnexal mass is seen. BONES AND SOFT TISSUES: Degenerative changes of the lumbar spine are seen. Chronic T12 compression deformity is noted. IMPRESSION: 1. No acute findings in the abdomen or pelvis. Electronically signed by: Oneil Devonshire MD MD 01/29/2024 08:33 PM EST RP Workstation: [id number]   CT HEAD WO CONTRAST ( ) Result Date: 01/29/2024 EXAM: CT HEAD WITHOUT CONTRAST 01/29/2024 07:31:28 PM TECHNIQUE: CT of the head was performed without the administration of intravenous contrast. Automated exposure control, iterative reconstruction, and/or weight based adjustment of the mA/kV was utilized to reduce the radiation dose to as low as reasonably achievable. COMPARISON: None available. CLINICAL HISTORY: Headache, new onset (Age >= 51y). FINDINGS: BRAIN AND VENTRICLES: No acute hemorrhage. No evidence of acute infarct. Mild periventricular and deep white matter hypodensity typical of chronic small vessel ischemia. Basal gangliar mineralization, typically senescent. ORBITS: Bilateral lens resection. SINUSES: No acute abnormality. SOFT TISSUES AND SKULL: No acute soft tissue abnormality. No skull fracture. IMPRESSION: 1. Chronic ischemic changes without acute abnormality. Electronically signed by: Oneil Devonshire MD MD 01/29/2024 08:30 PM EST RP Workstation: [id number]   DG Chest 2 View Result Date: 01/29/2024 CLINICAL DATA:  Wheezing EXAM: CHEST - 2 VIEW COMPARISON:  09/09/2023 FINDINGS: Frontal and lateral views of the chest demonstrates stable enlargement of the cardiac silhouette. There is persistent right hilar prominence, which may reflect prominence of the central vasculature. However, given progression since 2024 underlying  adenopathy or mass cannot be excluded. Further evaluation with contrasted CT chest may be useful. No acute  airspace disease, effusion, or pneumothorax. No acute bony abnormalities. Chronic T12 compression deformity. IMPRESSION: 1. Increased density at the right hilum. While this could reflect prominence of the central vasculature, underlying adenopathy or mass cannot be excluded and further evaluation with nonemergent contrasted chest CT is recommended. 2. Stable enlarged cardiac silhouette. Electronically Signed   By: Ozell Daring M.D.   On: 01/29/2024 18:38     Assessment & Plan: Ms. SHERALYN PINEGAR is a 79 y.o.  female with  past medical history including hypertension, hypothyroidism, hyperlipidemia, who was admitted to [facility] on 01/29/2024 for Hyponatremia [E87.1]  Severe acute hyponatremia, serum sodium on ED arrival 112.  Offending agent can include chlorthalidone .  Patient received 500 mL liter normal saline bolus in emergency department followed by lactated Ringer's.  Little response in sodium level, 114.  CT chest pending to evaluate lung malignancy.  Patient denies tobacco history.  We will stop lactated Ringer's and order hypertonic saline 3% at 30 mL/h.  Continue to trend serum sodium levels every 4 hours.  Goal of correction 8-10 points per 24 hours.  2.  Hypertension, essential.  Home regimen includes chlorthalidone .  Currently held due to hyponatremia.  Currently prescribed atenolol  and losartan .     LOS: 0 Ygnacio Fecteau 1/13/20261:32 PM     [1] No Known Allergies  "

## 2024-01-30 NOTE — Progress Notes (Signed)
 OT Cancellation Note  Patient Details Name: KEIRSTIN MUSIL MRN: [id number] DOB: 05/10/1945   Cancelled Treatment:    Reason Eval/Treat Not Completed: Medical issues which prohibited therapy. Received order and reviewed chart. Pt remains with critically low sodium levels. Per MD, hold on rehab evaluations at this time. Will continue to follow and move ahead with eval at pt is medically appropriate.    Suzen Hock 01/30/2024, 11:03 AM

## 2024-01-30 NOTE — Assessment & Plan Note (Addendum)
 Persistent hyponatremia with sodium at 114 so patient is being started on hypertonic saline.  Hyponatremia labs consistent with hypovolemic hyponatremia. Nephrology was consulted. Discontinue home chlorthalidone  as it can be contributory. CT chest was also ordered due to abnormal chest x-ray to rule out any underlying lung condition to cause SIADH -Monitor sodium-goal increase 6-8 in 24 hours

## 2024-01-30 NOTE — Assessment & Plan Note (Signed)
 CT chest was ordered as recommended by radiology as chest x-ray with concern of some right hilum density. - Follow-up CT chest

## 2024-01-30 NOTE — Assessment & Plan Note (Signed)
 Patient has developing some sore throat and early signs of congestion. -Respiratory panel was ordered -Supportive care

## 2024-01-31 DIAGNOSIS — E871 Hypo-osmolality and hyponatremia: Secondary | ICD-10-CM | POA: Diagnosis not present

## 2024-01-31 DIAGNOSIS — E876 Hypokalemia: Secondary | ICD-10-CM | POA: Diagnosis not present

## 2024-01-31 DIAGNOSIS — R1319 Other dysphagia: Secondary | ICD-10-CM | POA: Insufficient documentation

## 2024-01-31 DIAGNOSIS — J189 Pneumonia, unspecified organism: Secondary | ICD-10-CM | POA: Diagnosis not present

## 2024-01-31 LAB — RENAL FUNCTION PANEL
Albumin: 2.9 g/dL — ABNORMAL LOW (ref 3.5–5.0)
Anion gap: 9 (ref 5–15)
BUN: 13 mg/dL (ref 8–23)
CO2: 24 mmol/L (ref 22–32)
Calcium: 8.1 mg/dL — ABNORMAL LOW (ref 8.9–10.3)
Chloride: 92 mmol/L — ABNORMAL LOW (ref 98–111)
Creatinine, Ser: 0.7 mg/dL (ref 0.44–1.00)
GFR, Estimated: >60 mL/min
Glucose, Bld: 109 mg/dL — ABNORMAL HIGH (ref 70–99)
Phosphorus: 2.9 mg/dL (ref 2.5–4.6)
Potassium: 3.6 mmol/L (ref 3.5–5.1)
Sodium: 125 mmol/L — ABNORMAL LOW (ref 135–145)

## 2024-01-31 LAB — SODIUM
Sodium: 125 mmol/L — ABNORMAL LOW (ref 135–145)
Sodium: 124 mmol/L — ABNORMAL LOW (ref 135–145)
Sodium: 126 mmol/L — ABNORMAL LOW (ref 135–145)
Sodium: 124 mmol/L — ABNORMAL LOW (ref 135–145)
Sodium: 126 mmol/L — ABNORMAL LOW (ref 135–145)
Sodium: 127 mmol/L — ABNORMAL LOW (ref 135–145)

## 2024-01-31 LAB — CBC
HCT: 30.9 % — ABNORMAL LOW (ref 36.0–46.0)
Hemoglobin: 11.1 g/dL — ABNORMAL LOW (ref 12.0–15.0)
MCH: 31.2 pg (ref 26.0–34.0)
MCHC: 35.9 g/dL (ref 30.0–36.0)
MCV: 86.8 fL (ref 80.0–100.0)
Platelets: 243 K/uL (ref 150–400)
RBC: 3.56 MIL/uL — ABNORMAL LOW (ref 3.87–5.11)
RDW: 12.7 % (ref 11.5–15.5)
WBC: 13.9 K/uL — ABNORMAL HIGH (ref 4.0–10.5)
nRBC: 0 % (ref 0.0–0.2)

## 2024-01-31 LAB — MAGNESIUM: Magnesium: 2.4 mg/dL (ref 1.7–2.4)

## 2024-01-31 MED ORDER — SODIUM CHLORIDE 0.9 % IV SOLN
1.0000 g | INTRAVENOUS | Status: DC
  Administered 2024-02-01: 1 g via INTRAVENOUS
  Filled 2024-01-31: qty 10

## 2024-01-31 MED ORDER — AZITHROMYCIN 500 MG PO TABS
500.0000 mg | ORAL_TABLET | Freq: Every day | ORAL | Status: DC
  Administered 2024-02-01 – 2024-02-02 (×2): 500 mg via ORAL
  Filled 2024-01-31 (×2): qty 1

## 2024-01-31 MED ORDER — PANTOPRAZOLE SODIUM 40 MG PO TBEC
40.0000 mg | DELAYED_RELEASE_TABLET | Freq: Every day | ORAL | Status: DC
  Administered 2024-01-31 – 2024-02-02 (×3): 40 mg via ORAL
  Filled 2024-01-31 (×3): qty 1

## 2024-01-31 MED ORDER — ENSURE PLUS HIGH PROTEIN PO LIQD
237.0000 mL | Freq: Two times a day (BID) | ORAL | Status: DC
  Administered 2024-02-01 – 2024-02-02 (×3): 237 mL via ORAL

## 2024-01-31 MED ORDER — SODIUM CHLORIDE 3 % IV SOLN
INTRAVENOUS | Status: DC
  Filled 2024-01-31 (×2): qty 500

## 2024-01-31 MED ORDER — SODIUM CHLORIDE 0.9 % IV SOLN: INTRAVENOUS | Status: DC

## 2024-01-31 NOTE — Consult Note (Signed)
 Pharmacy Consulted for monitoring of Hypertonic Saline  Rachel Quinn is a 53 yoF presenting with concerns for hyponatremia. Past medical history notable for  HTN, HLD,  and hypothyroidism. Patient arrived following an appointment with PCP with concerns for muscle tightness. Was recommended to go to the ER for evaluation following resultant sodium level 112. Per PCP note, no apparent concerns for AMS or neurologic deficits. Based on fill history, patient takes atenolol -chlorthalidone . Recent baseline sodium in low 130s.  MIVF:  NaCL 3% @ 1ml/h started at 1029 on 1/13  Goal of Therapy:  Goal of correction per 24 hours IF sodium rises > 4 mEq/L over 2 hours   > 6 mEq/L over 4 hours - Pharmacy will evaluate and contact MD per consult.  Date Time Na  1/13 1023 114  1/13 1239 116  1/13 1615 117  1/13 1959 119  1/14 0107 125   Plan:  Sodium level has increased 6 mEq in last 5 hours (increased a total of 11 mEq since infusion started) Per Dr. Lawence, will discontinue hypertonic saline infusion and start normal saline at 100 mL/hr Continue to monitor labs q4h  Thank you for involving pharmacy in this patient's care.   Damien Napoleon, PharmD Clinical Pharmacist 01/31/2024 1:51 AM

## 2024-01-31 NOTE — Progress Notes (Signed)
 " [facility]  ROUNDING NOTE   Subjective:   Patient seen resting quietly Partially completed breakfast tray Alert Denies pain  Sodium 126   Objective:  Vital signs in last 24 hours:  Temp:  [98.1 F (36.7 C)-99.2 F (37.3 C)] 98.1 F (36.7 C) (01/14 1100) Pulse Rate:  [70-91] 89 (01/14 1100) Resp:  [13-22] 16 (01/14 1100) BP: (104-145)/(56-82) 145/82 (01/14 1100) SpO2:  [97 %-100 %] 99 % (01/14 1100)  Weight change:  Filed Weights   01/29/24 1700  Weight: 79.1 kg    Intake/Output: I/O last 3 completed shifts: In: 1014.7 [I.V.:414.7; IV Piggyback:600] Out: -    Intake/Output this shift:  No intake/output data recorded.  Physical Exam: General: NAD  Head: Normocephalic, atraumatic. Moist oral mucosal membranes  Eyes: Anicteric  Lungs:  Clear to auscultation  Heart: Regular rate and rhythm  Abdomen:  Soft, nontender  Extremities:  Trace peripheral edema.  Neurologic: Awake, alert, conversant  Skin: Warm,dry, no rash       Basic Metabolic Panel: Recent Labs  Lab 01/29/24 1248 01/29/24 1702 01/29/24 2137 01/30/24 0050 01/30/24 0457 01/30/24 1023 01/30/24 1239 01/30/24 1959 01/31/24 0107 01/31/24 0331 01/31/24 0757 01/31/24 1416  NA 112* 112*   < > 114* 114* 114*   < > 119* 125* 125*  124* 126* 124*  K 3.4* 3.5  --   --  3.2*  --   --   --   --  3.6  --   --   CL 77* 77*  --   --  80*  --   --   --   --  92*  --   --   CO2 27 24  --   --  24  --   --   --   --  24  --   --   GLUCOSE 121* 107*  --   --  94  --   --   --   --  109*  --   --   BUN 14 13  --   --  13  --   --   --   --  13  --   --   CREATININE 0.67 0.66  --   --  0.73  --   --   --   --  0.70  --   --   CALCIUM  8.9 9.2  --   --  8.3*  --   --   --   --  8.1*  --   --   MG  --   --   --  1.5*  --  1.6*  --   --   --  2.4  --   --   PHOS  --   --   --   --   --   --   --   --   --  2.9  --   --    < > = values in this interval not displayed.    Liver Function  Tests: Recent Labs  Lab 01/29/24 1702 01/31/24 0331  AST 21  --   ALT 14  --   ALKPHOS 83  --   BILITOT 2.2*  --   PROT 7.1  --   ALBUMIN 3.5 2.9*   No results for input(s): LIPASE, AMYLASE in the last 168 hours. No results for input(s): AMMONIA in the last 168 hours.  CBC: Recent Labs  Lab 01/29/24 1248 01/29/24 1702 01/30/24  0457 01/31/24 0331  WBC 14.5* 16.1* 14.0* 13.9*  NEUTROABS 12.8*  --   --   --   HGB 12.4 12.2 11.0* 11.1*  HCT 35.2* 32.9* 30.1* 30.9*  MCV 88.6 84.1 85.3 86.8  PLT 252.0 237 207 243    Cardiac Enzymes: Recent Labs  Lab 01/29/24 2137  CKTOTAL 88    BNP: Invalid input(s): POCBNP  CBG: No results for input(s): GLUCAP in the last 168 hours.  Microbiology: Results for orders placed or performed during the hospital encounter of 01/29/24  Resp panel by RT-PCR (RSV, Flu A&B, Covid) Anterior Nasal Swab     Status: None   Collection Time: 01/30/24  7:59 PM   Specimen: Anterior Nasal Swab  Result Value Ref Range Status   SARS Coronavirus 2 by RT PCR NEGATIVE NEGATIVE Final    Comment: (NOTE) SARS-CoV-2 target nucleic acids are NOT DETECTED.  The SARS-CoV-2 RNA is generally detectable in upper respiratory specimens during the acute phase of infection. The lowest concentration of SARS-CoV-2 viral copies this assay can detect is 138 copies/mL. A negative result does not preclude SARS-Cov-2 infection and should not be used as the sole basis for treatment or other patient management decisions. A negative result may occur with  improper specimen collection/handling, submission of specimen other than nasopharyngeal swab, presence of viral mutation(s) within the areas targeted by this assay, and inadequate number of viral copies(<138 copies/mL). A negative result must be combined with clinical observations, patient history, and epidemiological information. The expected result is Negative.  Fact Sheet for Patients:   bloggercourse.com  Fact Sheet for Healthcare Providers:  seriousbroker.it  This test is no t yet approved or cleared by the [place]  FDA and  has been authorized for detection and/or diagnosis of SARS-CoV-2 by FDA under an Emergency Use Authorization (EUA). This EUA will remain  in effect (meaning this test can be used) for the duration of the COVID-19 declaration under Section 564(b)(1) of the Act, 21 U.S.C.section 360bbb-3(b)(1), unless the authorization is terminated  or revoked sooner.       Influenza A by PCR NEGATIVE NEGATIVE Final   Influenza B by PCR NEGATIVE NEGATIVE Final    Comment: (NOTE) The Xpert Xpress SARS-CoV-2/FLU/RSV plus assay is intended as an aid in the diagnosis of influenza from Nasopharyngeal swab specimens and should not be used as a sole basis for treatment. Nasal washings and aspirates are unacceptable for Xpert Xpress SARS-CoV-2/FLU/RSV testing.  Fact Sheet for Patients: bloggercourse.com  Fact Sheet for Healthcare Providers: seriousbroker.it  This test is not yet approved or cleared by the [place]  FDA and has been authorized for detection and/or diagnosis of SARS-CoV-2 by FDA under an Emergency Use Authorization (EUA). This EUA will remain in effect (meaning this test can be used) for the duration of the COVID-19 declaration under Section 564(b)(1) of the Act, 21 U.S.C. section 360bbb-3(b)(1), unless the authorization is terminated or revoked.     Resp Syncytial Virus by PCR NEGATIVE NEGATIVE Final    Comment: (NOTE) Fact Sheet for Patients: bloggercourse.com  Fact Sheet for Healthcare Providers: seriousbroker.it  This test is not yet approved or cleared by the [place]  FDA and has been authorized for detection and/or diagnosis of SARS-CoV-2 by FDA under an Emergency Use  Authorization (EUA). This EUA will remain in effect (meaning this test can be used) for the duration of the COVID-19 declaration under Section 564(b)(1) of the Act, 21 U.S.C. section 360bbb-3(b)(1), unless the authorization is terminated or revoked.  Performed  at [facility], [address] [place], [place] [address]     Coagulation Studies: No results for input(s): LABPROT, INR in the last 72 hours.  Urinalysis: Recent Labs    01/29/24 2137  COLORURINE YELLOW*  LABSPEC >1.046*  PHURINE 7.0  GLUCOSEU NEGATIVE  HGBUR NEGATIVE  BILIRUBINUR NEGATIVE  KETONESUR NEGATIVE  PROTEINUR NEGATIVE  NITRITE NEGATIVE  LEUKOCYTESUR NEGATIVE      Imaging: CT CHEST W CONTRAST Result Date: 01/30/2024 CLINICAL DATA:  Right hilar opacity on same day chest radiograph EXAM: CT CHEST WITH CONTRAST TECHNIQUE: Multidetector CT imaging of the chest was performed during intravenous contrast administration. RADIATION DOSE REDUCTION: This exam was performed according to the departmental dose-optimization program which includes automated exposure control, adjustment of the mA and/or kV according to patient size and/or use of iterative reconstruction technique. CONTRAST:  75mL OMNIPAQUE  IOHEXOL  300 MG/ML  SOLN COMPARISON:  Same day chest radiograph FINDINGS: Cardiovascular: Normal heart size. No significant pericardial fluid/thickening. Dilated main pulmonary artery measures 4.3 cm. Dilated and tortuous right pulmonary arteries and pulmonary veins. No central pulmonary emboli. Coronary artery calcifications and aortic atherosclerosis. Mediastinum/Nodes: Thyroidectomy. Small hiatal hernia. 12 mm right hilar lymph node (2:59). Lungs/Pleura: The central airways are patent. Peripheral posterior right lower lobe ovoid opacity measures 2.4 x 1.1 cm (3:65). Left lower lobe subsegmental atelectasis. Scattered patchy ground-glass opacities. No pneumothorax. No pleural effusion. Upper abdomen: Punctate  calcification in the spleen, likely sequela of granulomatous infection. Musculoskeletal: Multilevel degenerative changes of the thoracic spine. Age indeterminate anterior wedging of T12. Mild superior endplate compression deformity of T1, T5. Old left rib fractures. IMPRESSION: 1. Peripheral posterior right lower lobe ovoid opacity measures 2.4 x 1.1 cm. This may represent an area of atelectasis or pneumonia. 2. Dilated main pulmonary artery measures 4.3 cm, which can be seen in the setting of pulmonary arterial hypertension. Queried right hilar density corresponds to dilated and tortuous right pulmonary arteries and pulmonary veins. No central pulmonary emboli. 3. A 12 mm right hilar lymph node, likely reactive. 4. Age indeterminate anterior wedging of T12. Mild superior endplate compression deformity of T1, T5. 5.  Aortic Atherosclerosis (ICD10-I70.0). Electronically Signed   By: Limin  Xu M.D.   On: 01/30/2024 15:43   DG Wrist Complete Right Result Date: 01/29/2024 CLINICAL DATA:  Wrist erythema EXAM: DG WRIST COMPLETE 3+V*R* COMPARISON:  12/09/2023 FINDINGS: No fracture or malalignment. Soft tissue edema without emphysema. Vascular calcifications IMPRESSION: No acute osseous abnormality. Electronically Signed   By: Luke Bun M.D.   On: 01/29/2024 22:30   CT ABDOMEN PELVIS W CONTRAST Result Date: 01/29/2024 EXAM: CT ABDOMEN AND PELVIS WITH CONTRAST 01/29/2024 07:31:28 PM TECHNIQUE: CT of the abdomen and pelvis was performed with the administration of 100 mL of iohexol  (OMNIPAQUE ) 300 MG/ML solution. Multiplanar reformatted images are provided for review. Automated exposure control, iterative reconstruction, and/or weight-based adjustment of the mA/kV was utilized to reduce the radiation dose to as low as reasonably achievable. COMPARISON: None available. CLINICAL HISTORY: Abdominal pain, acute, nonlocalized. FINDINGS: LOWER CHEST: Lung bases demonstrate mild right lower lobe atelectatic changes. Some  scarring in the left lung base is noted as well. LIVER: The liver is unremarkable. GALLBLADDER AND BILE DUCTS: Gallbladder is unremarkable. No biliary ductal dilatation. SPLEEN: No acute abnormality. PANCREAS: No acute abnormality. ADRENAL GLANDS: The adrenal glands are within normal limits. KIDNEYS, URETERS AND BLADDER: The left kidney has been surgically removed. The right kidney is mildly lobulated. No calculi or obstructive changes are noted in the right  kidney. The bladder is well distended. GI AND BOWEL: Stomach and small bowel are unremarkable. No obstructive or inflammatory changes of the colon are seen. Mild diverticular changes noted without diverticulitis. The appendix is within normal limits. PERITONEUM AND RETROPERITONEUM: No ascites. No free air. VASCULATURE: Aorta is normal in caliber. Aortic calcifications are noted without aneurysmal dilatation. LYMPH NODES: No lymphadenopathy. REPRODUCTIVE ORGANS: The uterus is within normal limits for the patient's age. No adnexal mass is seen. BONES AND SOFT TISSUES: Degenerative changes of the lumbar spine are seen. Chronic T12 compression deformity is noted. IMPRESSION: 1. No acute findings in the abdomen or pelvis. Electronically signed by: Oneil Devonshire MD MD 01/29/2024 08:33 PM EST RP Workstation: [id number]   CT HEAD WO CONTRAST ( ) Result Date: 01/29/2024 EXAM: CT HEAD WITHOUT CONTRAST 01/29/2024 07:31:28 PM TECHNIQUE: CT of the head was performed without the administration of intravenous contrast. Automated exposure control, iterative reconstruction, and/or weight based adjustment of the mA/kV was utilized to reduce the radiation dose to as low as reasonably achievable. COMPARISON: None available. CLINICAL HISTORY: Headache, new onset (Age >= 51y). FINDINGS: BRAIN AND VENTRICLES: No acute hemorrhage. No evidence of acute infarct. Mild periventricular and deep white matter hypodensity typical of chronic small vessel ischemia. Basal gangliar  mineralization, typically senescent. ORBITS: Bilateral lens resection. SINUSES: No acute abnormality. SOFT TISSUES AND SKULL: No acute soft tissue abnormality. No skull fracture. IMPRESSION: 1. Chronic ischemic changes without acute abnormality. Electronically signed by: Oneil Devonshire MD MD 01/29/2024 08:30 PM EST RP Workstation: [id number]   DG Chest 2 View Result Date: 01/29/2024 CLINICAL DATA:  Wheezing EXAM: CHEST - 2 VIEW COMPARISON:  09/09/2023 FINDINGS: Frontal and lateral views of the chest demonstrates stable enlargement of the cardiac silhouette. There is persistent right hilar prominence, which may reflect prominence of the central vasculature. However, given progression since 2024 underlying adenopathy or mass cannot be excluded. Further evaluation with contrasted CT chest may be useful. No acute airspace disease, effusion, or pneumothorax. No acute bony abnormalities. Chronic T12 compression deformity. IMPRESSION: 1. Increased density at the right hilum. While this could reflect prominence of the central vasculature, underlying adenopathy or mass cannot be excluded and further evaluation with nonemergent contrasted chest CT is recommended. 2. Stable enlarged cardiac silhouette. Electronically Signed   By: Ozell Daring M.D.   On: 01/29/2024 18:38     Medications:     atenolol   25 mg Oral Daily   atorvastatin   10 mg Oral Daily   calcium  carbonate  1 tablet Oral BID WC   heparin   5,000 Units Subcutaneous Q8H   ipratropium-albuterol   3 mL Nebulization Q6H   levothyroxine   75 mcg Oral QAC breakfast   losartan   25 mg Oral Daily   sodium chloride  flush  3 mL Intravenous Q12H   acetaminophen  **OR** acetaminophen , morphine  injection, ondansetron  **OR** ondansetron  (ZOFRAN ) IV, phenol, senna-docusate  Assessment/ Plan:  Rachel Quinn is a 79 y.o.  female with past medical history including hypertension, hypothyroidism, hyperlipidemia, who was admitted to [facility] on 01/29/2024 for  Hyponatremia [E87.1]   Severe acute hyponatremia, serum sodium on ED arrival 112.  Offending agent can include chlorthalidone .  Patient received 500 mL liter normal saline bolus in emergency department followed by lactated Ringer's.  Little response in sodium level, 114.  CT chest shows possible pneumonia.  Sodium 126, 3% hypertonic saline stopped. NS infusing this morning, this has also been stopped.  Will allow levels to stabilize and determine treatment moving forward.   Hypertension, essential.  Home regimen includes chlorthalidone .  Currently held due to hyponatremia.  Currently prescribed atenolol  and losartan .    LOS: 1 Robyne Matar 1/14/20263:00 PM   "

## 2024-01-31 NOTE — Progress Notes (Signed)
 " PROGRESS NOTE    Kiara L Quant  FMW:[id number] DOB: [birth year] DOA: 01/29/2024 PCP: Wendee Lynwood HERO, NP    Brief Narrative:   Patient is a 79 year old female with PMHx of HTN, HLD, hypothyroidism who presented to the ED on 01/29/2024 from [facility] office after outpatient labs revealed severe hyponatremia. Reportedly, the patient was in her usual state of health last week but experienced decreased appetite, nausea, vomiting, and joint pains over the last few days.  On presentation, vital signs were stable.  CBC showed leukocytosis to 16.1.  CMP showed sodium 112, chloride 77, T. bili 2.2.  UA unremarkable CK WNL.  CXR showed increased density at the right hilum with recommendation to obtain CT chest CT abd pelvis without acute abnormalities  The patient was admitted for management of acute hyponatremia  Assessment and Plan:   # Hyponatremia - Na 112 on admission - Received NS bolus in ED then started on IVF with LR. Ultimately switched to 3% saline with improvement in Na to 125 prompting discontinuation of 3% saline - Was started on NS overnight but discontinued by nephrology today in allow Na levels to settle and reevaluate tomorrow.  - Na began to drop again prompting resumption of 3% saline this evening  # Hypomagensemia -resolved  # Abnormal CXR #Possible pulm arterial hypertension - CXR (1/12) noted increased density at the right hilum with concern for mass - CT chest (1/13) showed that the right hilar density corresponds to dilated and tortuous right pulmonary artery and pulmonary veins, noting dilated main pulmonary artery measuring 4.3 cm which can be seen in the setting of pulmonary arterial hypertension  # Possible CAP - CT chest showed RLL opacity 2.4 x 1.1 cm c/f pneumonia vs atelectasis, LLL subsegmental atelectasis with scattered patchy GGO - Patient currently with persistent leukocytosis - Ordered procalcitonin - Will start rocephin  and PO azithromycin     # Hypokalemia - resolved  # HTN - Home atenolol -clorthalidone pill discontinued  - Continue atenolol  separately - Continue losartan   # HLD - Continue home Lipitor  # Hypothyroidism - Continue home Synthroid   # Arthritic-like pain Patient complaining of arthritic like pain involving different joints.  She was recently referred to see a rheumatologist by her PCP.  Uric acid was tested by PCP and it was normal.  Right wrist imaging was done on admission for concern of some swelling and it was negative for any osseous abnormality. - Outpatient rheumatology evaluation - Supportive care   # Sore throat Patient has developing some sore throat and early signs of congestion. - Respiratory panel negative - Supportive care  # Esophageal dysphagia # Possible reflux - Evaluated by SLP, noted to exhibit s/s of esophageal dysmotility with globus sensation - Started PO protonix  40 mg daily - Referral to outpatient GI for evaluation of esophageal dysphagia  Scheduled Meds:  atenolol   25 mg Oral Daily   atorvastatin   10 mg Oral Daily   calcium  carbonate  1 tablet Oral BID WC   heparin   5,000 Units Subcutaneous Q8H   ipratropium-albuterol   3 mL Nebulization Q6H   levothyroxine   75 mcg Oral QAC breakfast   losartan   25 mg Oral Daily   sodium chloride  flush  3 mL Intravenous Q12H   Continuous Infusions:  sodium chloride  100 mL/hr at 01/31/24 0159   PRN Meds:.acetaminophen  **OR** acetaminophen , morphine  injection, ondansetron  **OR** ondansetron  (ZOFRAN ) IV, phenol, senna-docusate  Current Outpatient Medications  Medication Instructions   acetaminophen  (TYLENOL ) 500 mg, Oral, Every 6 hours PRN  albuterol  (VENTOLIN  HFA) 108 (90 Base) MCG/ACT inhaler 2 puffs, Inhalation, Every 4 hours PRN   atenolol -chlorthalidone  (TENORETIC ) 50-25 MG tablet 1 tablet, Oral, Daily   atorvastatin  (LIPITOR) 10 mg, Oral, Daily   calcium  carbonate (OS-CAL - DOSED IN MG OF ELEMENTAL CALCIUM ) 1250 (500 Ca) MG  tablet 1 tablet   levothyroxine  (SYNTHROID ) 75 mcg, Oral, Daily   potassium citrate  (UROCIT-K ) 10 MEQ (1080 MG) SR tablet 10 mEq, Oral, Daily   predniSONE  (DELTASONE ) 10 MG tablet Take 4 pills once daily by mouth for 3 days, then 3 pills daily for 3 days, then 2 pills daily for 3 days then 1 pill daily for 3 days then stop   tiZANidine  (ZANAFLEX ) 2 mg, Oral, At bedtime PRN   traMADol  (ULTRAM ) 50 mg, Oral, Every 6 hours PRN    DVT prophylaxis: heparin  injection 5,000 Units Start: 01/29/24 2200   Code Status:   Code Status: Full Code  Family Communication: Discussed with husband at bedside  Disposition Plan: Likely SNF pending clinical improvement PT - Follow Up Recommendations: Skilled nursing-short term rehab (<3 hours/day) - PT equipment: None recommended by PT OT - Follow Up Recommendations: Acute inpatient rehab (3hours/day) -    Level of care: Stepdown  Consultants:  Nephrology   Subjective: Patient examined at bedside.  Continues to report pain that limits her range of motion in her upper extremities and lower extremities.  Per RN, patient is requiring two-person max assist but mostly limited by pain.  Objective: Vitals:   01/31/24 0028 01/31/24 0200 01/31/24 0446 01/31/24 0500  BP: (!) 117/56 128/73 118/75 138/78  Pulse: 73 74 81 82  Resp: 16 17 16 15   Temp: 98.8 F (37.1 C)  99.1 F (37.3 C)   TempSrc:   Oral   SpO2: 97% 100% 100% 98%  Weight:      Height:        Intake/Output Summary (Last 24 hours) at 01/31/2024 0846 Last data filed at 01/31/2024 0157 Gross per 24 hour  Intake 514.7 ml  Output --  Net 514.7 ml   Filed Weights   01/29/24 1700  Weight: 79.1 kg    Examination:  Gen: NAD, A&Ox3, sleepy HEENT: NCAT Neck: Supple CV: RRR, no murmurs Resp: normal WOB, CTAB, no w/r/r Abd: Soft, NTND, no guarding, BS normoactive Ext: No LE edema Skin: Warm, dry Neuro: ROM of upper extremities limited by pain, sensation intact bilaterally, gait not tested  again due to limitation from pain Psych: Calm, cooperative, appropriate affect    Data Reviewed: I have personally reviewed following labs and imaging studies  CBC: Recent Labs  Lab 01/29/24 1248 01/29/24 1702 01/30/24 0457 01/31/24 0331  WBC 14.5* 16.1* 14.0* 13.9*  NEUTROABS 12.8*  --   --   --   HGB 12.4 12.2 11.0* 11.1*  HCT 35.2* 32.9* 30.1* 30.9*  MCV 88.6 84.1 85.3 86.8  PLT 252.0 237 207 243   Basic Metabolic Panel: Recent Labs  Lab 01/29/24 1248 01/29/24 1702 01/29/24 2137 01/30/24 0050 01/30/24 0457 01/30/24 1023 01/30/24 1239 01/30/24 1615 01/30/24 1959 01/31/24 0107 01/31/24 0331 01/31/24 0757  NA 112* 112*   < > 114* 114* 114*   < > 117* 119* 125* 125*  124* 126*  K 3.4* 3.5  --   --  3.2*  --   --   --   --   --  3.6  --   CL 77* 77*  --   --  80*  --   --   --   --   --  92*  --   CO2 27 24  --   --  24  --   --   --   --   --  24  --   GLUCOSE 121* 107*  --   --  94  --   --   --   --   --  109*  --   BUN 14 13  --   --  13  --   --   --   --   --  13  --   CREATININE 0.67 0.66  --   --  0.73  --   --   --   --   --  0.70  --   CALCIUM  8.9 9.2  --   --  8.3*  --   --   --   --   --  8.1*  --   MG  --   --   --  1.5*  --  1.6*  --   --   --   --   --   --   PHOS  --   --   --   --   --   --   --   --   --   --  2.9  --    < > = values in this interval not displayed.   GFR: Estimated Creatinine Clearance: 54.8 mL/min (by C-G formula based on SCr of 0.7 mg/dL). Liver Function Tests: Recent Labs  Lab 01/29/24 1702 01/31/24 0331  AST 21  --   ALT 14  --   ALKPHOS 83  --   BILITOT 2.2*  --   PROT 7.1  --   ALBUMIN 3.5 2.9*   No results for input(s): LIPASE, AMYLASE in the last 168 hours. No results for input(s): AMMONIA in the last 168 hours. Coagulation Profile: No results for input(s): INR, PROTIME in the last 168 hours. Cardiac Enzymes: Recent Labs  Lab 01/29/24 2137  CKTOTAL 88   BNP (last 3 results) No results for  input(s): PROBNP in the last 8760 hours. HbA1C: No results for input(s): HGBA1C in the last 72 hours. CBG: No results for input(s): GLUCAP in the last 168 hours. Lipid Profile: No results for input(s): CHOL, HDL, LDLCALC, TRIG, CHOLHDL, LDLDIRECT in the last 72 hours. Thyroid  Function Tests: Recent Labs    01/30/24 1615  TSH 9.050*   Anemia Panel: No results for input(s): VITAMINB12, FOLATE, FERRITIN, TIBC, IRON, RETICCTPCT in the last 72 hours. Sepsis Labs: No results for input(s): PROCALCITON, LATICACIDVEN in the last 168 hours.  Recent Results (from the past 240 hours)  Resp panel by RT-PCR (RSV, Flu A&B, Covid) Anterior Nasal Swab     Status: None   Collection Time: 01/30/24  7:59 PM   Specimen: Anterior Nasal Swab  Result Value Ref Range Status   SARS Coronavirus 2 by RT PCR NEGATIVE NEGATIVE Final    Comment: (NOTE) SARS-CoV-2 target nucleic acids are NOT DETECTED.  The SARS-CoV-2 RNA is generally detectable in upper respiratory specimens during the acute phase of infection. The lowest concentration of SARS-CoV-2 viral copies this assay can detect is 138 copies/mL. A negative result does not preclude SARS-Cov-2 infection and should not be used as the sole basis for treatment or other patient management decisions. A negative result may occur with  improper specimen collection/handling, submission of specimen other than nasopharyngeal swab, presence of viral mutation(s) within the areas targeted by this assay, and inadequate number  of viral copies(<138 copies/mL). A negative result must be combined with clinical observations, patient history, and epidemiological information. The expected result is Negative.  Fact Sheet for Patients:  bloggercourse.com  Fact Sheet for Healthcare Providers:  seriousbroker.it  This test is no t yet approved or cleared by the [place]  FDA and  has  been authorized for detection and/or diagnosis of SARS-CoV-2 by FDA under an Emergency Use Authorization (EUA). This EUA will remain  in effect (meaning this test can be used) for the duration of the COVID-19 declaration under Section 564(b)(1) of the Act, 21 U.S.C.section 360bbb-3(b)(1), unless the authorization is terminated  or revoked sooner.       Influenza A by PCR NEGATIVE NEGATIVE Final   Influenza B by PCR NEGATIVE NEGATIVE Final    Comment: (NOTE) The Xpert Xpress SARS-CoV-2/FLU/RSV plus assay is intended as an aid in the diagnosis of influenza from Nasopharyngeal swab specimens and should not be used as a sole basis for treatment. Nasal washings and aspirates are unacceptable for Xpert Xpress SARS-CoV-2/FLU/RSV testing.  Fact Sheet for Patients: bloggercourse.com  Fact Sheet for Healthcare Providers: seriousbroker.it  This test is not yet approved or cleared by the [place]  FDA and has been authorized for detection and/or diagnosis of SARS-CoV-2 by FDA under an Emergency Use Authorization (EUA). This EUA will remain in effect (meaning this test can be used) for the duration of the COVID-19 declaration under Section 564(b)(1) of the Act, 21 U.S.C. section 360bbb-3(b)(1), unless the authorization is terminated or revoked.     Resp Syncytial Virus by PCR NEGATIVE NEGATIVE Final    Comment: (NOTE) Fact Sheet for Patients: bloggercourse.com  Fact Sheet for Healthcare Providers: seriousbroker.it  This test is not yet approved or cleared by the [place]  FDA and has been authorized for detection and/or diagnosis of SARS-CoV-2 by FDA under an Emergency Use Authorization (EUA). This EUA will remain in effect (meaning this test can be used) for the duration of the COVID-19 declaration under Section 564(b)(1) of the Act, 21 U.S.C. section 360bbb-3(b)(1), unless the  authorization is terminated or revoked.  Performed at [facility], [address] [place], [place] [address]      Radiology Studies: CT CHEST W CONTRAST Result Date: 01/30/2024 CLINICAL DATA:  Right hilar opacity on same day chest radiograph EXAM: CT CHEST WITH CONTRAST TECHNIQUE: Multidetector CT imaging of the chest was performed during intravenous contrast administration. RADIATION DOSE REDUCTION: This exam was performed according to the departmental dose-optimization program which includes automated exposure control, adjustment of the mA and/or kV according to patient size and/or use of iterative reconstruction technique. CONTRAST:  75mL OMNIPAQUE  IOHEXOL  300 MG/ML  SOLN COMPARISON:  Same day chest radiograph FINDINGS: Cardiovascular: Normal heart size. No significant pericardial fluid/thickening. Dilated main pulmonary artery measures 4.3 cm. Dilated and tortuous right pulmonary arteries and pulmonary veins. No central pulmonary emboli. Coronary artery calcifications and aortic atherosclerosis. Mediastinum/Nodes: Thyroidectomy. Small hiatal hernia. 12 mm right hilar lymph node (2:59). Lungs/Pleura: The central airways are patent. Peripheral posterior right lower lobe ovoid opacity measures 2.4 x 1.1 cm (3:65). Left lower lobe subsegmental atelectasis. Scattered patchy ground-glass opacities. No pneumothorax. No pleural effusion. Upper abdomen: Punctate calcification in the spleen, likely sequela of granulomatous infection. Musculoskeletal: Multilevel degenerative changes of the thoracic spine. Age indeterminate anterior wedging of T12. Mild superior endplate compression deformity of T1, T5. Old left rib fractures. IMPRESSION: 1. Peripheral posterior right lower lobe ovoid opacity measures 2.4 x 1.1 cm. This may represent an  area of atelectasis or pneumonia. 2. Dilated main pulmonary artery measures 4.3 cm, which can be seen in the setting of pulmonary arterial hypertension. Queried right  hilar density corresponds to dilated and tortuous right pulmonary arteries and pulmonary veins. No central pulmonary emboli. 3. A 12 mm right hilar lymph node, likely reactive. 4. Age indeterminate anterior wedging of T12. Mild superior endplate compression deformity of T1, T5. 5.  Aortic Atherosclerosis (ICD10-I70.0). Electronically Signed   By: Limin  Xu M.D.   On: 01/30/2024 15:43   DG Wrist Complete Right Result Date: 01/29/2024 CLINICAL DATA:  Wrist erythema EXAM: DG WRIST COMPLETE 3+V*R* COMPARISON:  12/09/2023 FINDINGS: No fracture or malalignment. Soft tissue edema without emphysema. Vascular calcifications IMPRESSION: No acute osseous abnormality. Electronically Signed   By: Luke Bun M.D.   On: 01/29/2024 22:30   CT ABDOMEN PELVIS W CONTRAST Result Date: 01/29/2024 EXAM: CT ABDOMEN AND PELVIS WITH CONTRAST 01/29/2024 07:31:28 PM TECHNIQUE: CT of the abdomen and pelvis was performed with the administration of 100 mL of iohexol  (OMNIPAQUE ) 300 MG/ML solution. Multiplanar reformatted images are provided for review. Automated exposure control, iterative reconstruction, and/or weight-based adjustment of the mA/kV was utilized to reduce the radiation dose to as low as reasonably achievable. COMPARISON: None available. CLINICAL HISTORY: Abdominal pain, acute, nonlocalized. FINDINGS: LOWER CHEST: Lung bases demonstrate mild right lower lobe atelectatic changes. Some scarring in the left lung base is noted as well. LIVER: The liver is unremarkable. GALLBLADDER AND BILE DUCTS: Gallbladder is unremarkable. No biliary ductal dilatation. SPLEEN: No acute abnormality. PANCREAS: No acute abnormality. ADRENAL GLANDS: The adrenal glands are within normal limits. KIDNEYS, URETERS AND BLADDER: The left kidney has been surgically removed. The right kidney is mildly lobulated. No calculi or obstructive changes are noted in the right kidney. The bladder is well distended. GI AND BOWEL: Stomach and small bowel are  unremarkable. No obstructive or inflammatory changes of the colon are seen. Mild diverticular changes noted without diverticulitis. The appendix is within normal limits. PERITONEUM AND RETROPERITONEUM: No ascites. No free air. VASCULATURE: Aorta is normal in caliber. Aortic calcifications are noted without aneurysmal dilatation. LYMPH NODES: No lymphadenopathy. REPRODUCTIVE ORGANS: The uterus is within normal limits for the patient's age. No adnexal mass is seen. BONES AND SOFT TISSUES: Degenerative changes of the lumbar spine are seen. Chronic T12 compression deformity is noted. IMPRESSION: 1. No acute findings in the abdomen or pelvis. Electronically signed by: Oneil Devonshire MD MD 01/29/2024 08:33 PM EST RP Workstation: [id number]   CT HEAD WO CONTRAST ( ) Result Date: 01/29/2024 EXAM: CT HEAD WITHOUT CONTRAST 01/29/2024 07:31:28 PM TECHNIQUE: CT of the head was performed without the administration of intravenous contrast. Automated exposure control, iterative reconstruction, and/or weight based adjustment of the mA/kV was utilized to reduce the radiation dose to as low as reasonably achievable. COMPARISON: None available. CLINICAL HISTORY: Headache, new onset (Age >= 51y). FINDINGS: BRAIN AND VENTRICLES: No acute hemorrhage. No evidence of acute infarct. Mild periventricular and deep white matter hypodensity typical of chronic small vessel ischemia. Basal gangliar mineralization, typically senescent. ORBITS: Bilateral lens resection. SINUSES: No acute abnormality. SOFT TISSUES AND SKULL: No acute soft tissue abnormality. No skull fracture. IMPRESSION: 1. Chronic ischemic changes without acute abnormality. Electronically signed by: Oneil Devonshire MD MD 01/29/2024 08:30 PM EST RP Workstation: [id number]   DG Chest 2 View Result Date: 01/29/2024 CLINICAL DATA:  Wheezing EXAM: CHEST - 2 VIEW COMPARISON:  09/09/2023 FINDINGS: Frontal and lateral views of the chest demonstrates stable enlargement of the  cardiac  silhouette. There is persistent right hilar prominence, which may reflect prominence of the central vasculature. However, given progression since 2024 underlying adenopathy or mass cannot be excluded. Further evaluation with contrasted CT chest may be useful. No acute airspace disease, effusion, or pneumothorax. No acute bony abnormalities. Chronic T12 compression deformity. IMPRESSION: 1. Increased density at the right hilum. While this could reflect prominence of the central vasculature, underlying adenopathy or mass cannot be excluded and further evaluation with nonemergent contrasted chest CT is recommended. 2. Stable enlarged cardiac silhouette. Electronically Signed   By: Ozell Daring M.D.   On: 01/29/2024 18:38    Scheduled Meds:  atenolol   25 mg Oral Daily   atorvastatin   10 mg Oral Daily   calcium  carbonate  1 tablet Oral BID WC   heparin   5,000 Units Subcutaneous Q8H   ipratropium-albuterol   3 mL Nebulization Q6H   levothyroxine   75 mcg Oral QAC breakfast   losartan   25 mg Oral Daily   sodium chloride  flush  3 mL Intravenous Q12H   Continuous Infusions:  sodium chloride  100 mL/hr at 01/31/24 0159     Unresulted Labs (From admission, onward)     Start     Ordered   02/01/24 0500  CBC  Tomorrow morning,   R        01/31/24 2252   02/01/24 0500  Basic metabolic panel with GFR  Tomorrow morning,   R        01/31/24 2252   01/31/24 1101  Sodium  Once,   TIMED        01/31/24 1101   01/30/24 1501  Sodium  Every 4 hours,   TIMED      01/30/24 0903             LOS:  LOS: 1 day   Time Spent: 55 minutes  Jashad Depaula Al-Sultani, MD [facility]  If 7PM-7AM, please contact night-coverage  01/31/2024, 8:46 AM      "

## 2024-01-31 NOTE — Evaluation (Signed)
 Occupational Therapy Evaluation Patient Details Name: ELYA TARQUINIO MRN: [id number] DOB: Nov 01, 1945 Today's Date: 01/31/2024   History of Present Illness   Darina Snellgrove is a 78yoF who is sent to ED from home by PCP regarding Na+: 112. Pt has been dealing with myalgias, nausea, recently. Pt saw [facility] 01/24/23 who recommended consult with rheumatology. PMH: HTN, HLD, hypoTSH,  nephrectomy, recent Rt wrist pain.     Clinical Impressions Patient was seen for OT evaluation this date. Prior to hospital admission, patient was independent with ADLs and transfers. Patient presents with generalized pain rating 9/10 (received IV morphine  prior to OT), patient tearful throughout. Patient performed SPT from recliner to [facility] to void, required total A for transfer and total A x 2 for toileting (hygiene/clothing management). Noted improvement when transferring to patients R side (still max A).   Patient presents with deficits in standing tolerance and gross strength related to pain, affecting safe and optimal ADL completion. Patient is currently requiring max-total A for all tasks.  Paient would benefit from skilled OT services to address noted impairments and functional limitations (see below for any additional details) in order to maximize safety and independence while minimizing future risk of falls, injury, and readmission. Anticipate the need for follow up OT services upon acute hospital DC.      If plan is discharge home, recommend the following:   Two people to help with walking and/or transfers;Two people to help with bathing/dressing/bathroom;Help with stairs or ramp for entrance     Functional Status Assessment   Patient has had a recent decline in their functional status and demonstrates the ability to make significant improvements in function in a reasonable and predictable amount of time.     Equipment Recommendations   None recommended by OT     Recommendations  for Other Services   Rehab consult     Precautions/Restrictions   Precautions Precautions: Fall Recall of Precautions/Restrictions: Intact Restrictions Weight Bearing Restrictions Per Provider Order: No     Mobility Bed Mobility               General bed mobility comments: OOB pre/post tx    Transfers Overall transfer level: Needs assistance Equipment used: 2 person hand held assist Transfers: Bed to chair/wheelchair/BSC Sit to Stand: Max assist, Total assist, +2 physical assistance   Squat pivot transfers: Max assist, +2 physical assistance       General transfer comment: patient with improvement in transfers when going towards her R      Balance Overall balance assessment: Needs assistance Sitting-balance support: Feet supported, Bilateral upper extremity supported Sitting balance-Leahy Scale: Fair     Standing balance support: During functional activity, Reliant on assistive device for balance Standing balance-Leahy Scale: Poor                             ADL either performed or assessed with clinical judgement   ADL Overall ADL's : Needs assistance/impaired                                       General ADL Comments: anticipate max-total A for all LB self care tasks, mod A for UB self care tasks     Vision         Perception         Praxis  Pertinent Vitals/Pain Pain Assessment Pain Assessment: Faces Faces Pain Scale: Hurts whole lot Pain Location: generalized Pain Intervention(s): Monitored during session, Limited activity within patient's tolerance, Premedicated before session     Extremity/Trunk Assessment Upper Extremity Assessment Upper Extremity Assessment: Generalized weakness   Lower Extremity Assessment Lower Extremity Assessment: Defer to PT evaluation       Communication Communication Communication: No apparent difficulties   Cognition Arousal: Alert Behavior During Therapy: WFL  for tasks assessed/performed Cognition: No apparent impairments                               Following commands: Intact       Cueing  General Comments   Cueing Techniques: Verbal cues      Exercises     Shoulder Instructions      Home Living Family/patient expects to be discharged to:: Private residence Living Arrangements: Spouse/significant other Available Help at Discharge: Family;Available 24 hours/day Type of Home: House Home Access: Ramped entrance     Home Layout: One level     Bathroom Shower/Tub: Chief Strategy Officer: Standard Bathroom Accessibility: Yes   Home Equipment: Shower seat;Grab bars - tub/shower;Rolling Walker (2 wheels);Wheelchair - manual;Cane - single point   Additional Comments: typically no need for cane      Prior Functioning/Environment Prior Level of Function : Independent/Modified Independent             Mobility Comments: no AD use, denies falls ADLs Comments: independent    OT Problem List: Decreased strength;Pain;Impaired balance (sitting and/or standing);Decreased activity tolerance   OT Treatment/Interventions: Self-care/ADL training;Therapeutic exercise;DME and/or AE instruction;Therapeutic activities;Patient/family education      OT Goals(Current goals can be found in the care plan section)   Acute Rehab OT Goals Patient Stated Goal: to have less pain OT Goal Formulation: With patient Time For Goal Achievement: 02/14/24 Potential to Achieve Goals: Fair ADL Goals Pt Will Perform Grooming: with supervision;with set-up;sitting Pt Will Perform Lower Body Dressing: with min assist;sit to/from stand;sitting/lateral leans Pt Will Transfer to Toilet: with min assist;bedside commode Pt Will Perform Toileting - Clothing Manipulation and hygiene: with min assist;with caregiver independent in assisting   OT Frequency:  Min 2X/week    Co-evaluation              AM-PAC OT 6 Clicks  Daily Activity     Outcome Measure Help from another person eating meals?: A Little Help from another person taking care of personal grooming?: A Little Help from another person toileting, which includes using toliet, bedpan, or urinal?: Total Help from another person bathing (including washing, rinsing, drying)?: Total Help from another person to put on and taking off regular upper body clothing?: Total Help from another person to put on and taking off regular lower body clothing?: Total 6 Click Score: 10   End of Session Equipment Utilized During Treatment: Gait belt Nurse Communication: Mobility status  Activity Tolerance: Patient limited by pain Patient left: in chair;with call bell/phone within reach;with family/visitor present  OT Visit Diagnosis: Unsteadiness on feet (R26.81);Muscle weakness (generalized) (M62.81);Other abnormalities of gait and mobility (R26.89);Pain                Time: 8951-8885 OT Time Calculation (min): 26 min Charges:  OT General Charges $OT Visit: 1 Visit OT Evaluation $OT Eval Moderate Complexity: 1 Mod OT Treatments $Self Care/Home Management : 8-22 mins  Rogers Clause, OT/L MSOT, 01/31/2024

## 2024-01-31 NOTE — Discharge Instructions (Signed)
 We performed a CT scan of your chest and found imaging findings that may indicate you have pulmonary arterial hypertension. Please follow up with your PCP for further evaluation.

## 2024-01-31 NOTE — Evaluation (Signed)
 Clinical/Bedside Swallow Evaluation Patient Details  Name: Rachel Quinn MRN: [id number] Date of Birth: 1946/01/07  Today's Date: 01/31/2024 Time: SLP Start Time (ACUTE ONLY): 1430 SLP Stop Time (ACUTE ONLY): 1530 SLP Time Calculation (min) (ACUTE ONLY): 60 min  Past Medical History:  Past Medical History:  Diagnosis Date   Cataract    Chronic kidney disease    Heart murmur    Hypertension    Kidney stone    Thyroid  disease    Past Surgical History:  Past Surgical History:  Procedure Laterality Date   CATARACT EXTRACTION Bilateral 01/2020   KIDNEY SURGERY  2000   KNEE ARTHROSCOPY Left 09/10/2023   Procedure: ARTHROSCOPY, KNEE;  Surgeon: Lorelle Hussar, MD;  Location: [facility] ORS;  Service: Orthopedics;  Laterality: Left;   NEPHRECTOMY Left 01/18/1999   2/2 nephrolithiasis   THYROIDECTOMY  01/17/2002   TUBAL LIGATION     HPI:  Pt is a 79 y.o. year old female with medical history of HTN, CKD, HLD, hypothyroidism presenting to the ED after Rheumatology office noted pt with severely low sodium.  Patient reports not eating well over the last several days w/ episode of N/V; states she has been taking her medications regularly.  She reports malaise.  On ROS, complains of right wrist pain and states she has family history of gout. On arrival to the ED patient was noted to be HDS stable.  Lab work and imaging obtained.  Blood work repeated and CBC with leukocytosis at 16.1, normal hemoglobin.  CMP with low Sodium at 112 unchanged from earlier today, potassium at 3.5, chloride at 77, bilirubin elevated at 2.2.    Chest Imaging: Peripheral posterior right lower lobe ovoid opacity measures 2.4  x 1.1 cm. This may represent an area of atelectasis or pneumonia.  2. Dilated main pulmonary artery measures 4.3 cm, which can be seen  in the setting of pulmonary arterial hypertension. Queried right  hilar density corresponds to dilated and tortuous right pulmonary  arteries and pulmonary veins. No  central pulmonary emboli.  3. A 12 mm right hilar lymph node, likely reactive.  Left atelectasis.  No pleural effusion.    Assessment / Plan / Recommendation  Clinical Impression   Pt seen for BSE today. Pt awake, sitting in chair. Husband present. Pt and Husband English-speaking (fair+) and declined an Equities Trader. Pt and Husband endorsed pt's s/s of Globus sensations when eating solid foods but able to drink Ensure and water. Pt is Edentulous and wears her Dentures at home sometimes. Pt denied taking anything for REFLUX at home. Pt stated she was tired. Noted pt exhibited Weak UEs/hands and had difficulty holding Cup/utensil - recommend OT f/u. On RA, afebrile. WBC elevated.   OF NOTE: Pt endorse s/s of REFLUX currently and sometimes at home c/b Globus, Belching, full feeling; difficulty eating meats and solids but drinking liquids ok. She is Not on a PPI at home.   Pt appears to present w/ functional oropharyngeal phase swallowing w/ No overt oropharyngeal phase dysphagia appreciated during oral intake of trials; No sensorimotor deficits appreciated. Pt is Edentulous.  Pt appears at reduced risk for aspiration from an oropharyngeal phase standpoint when following general aspiration precautions w/ easy to gum/mash/eat foods d/t Edentulous status. HOWEVER, pt has a baseline presentation of REFLUX s/s and episodes of REFLUX behavior. ANY Esophageal phase Dysmotility or Regurgitation of REFLUX material can increase risk for aspiration of the REFLUX material during Retrograde flow thus impact Voicing and Pulmonary status.  Pt sat upright in chair and consumed several trials of thin liquids Via Cup/Straw, purees, and broken/moistened solid foods w/ No overt clinical s/s of aspiration noted; clear vocal quality b/t trials, no decline in pulmonary status, no cough, no decline in O2 sats(98%). Oral phase appeared [facility] for bolus management and timely A-P transfer/clearing of material.  Mashing/gumming appropriate for textured boluses. OM exam was [facility] for oral clearing; lingual/labial movements. No unilateral weakness noted. Speech clear.    Recommend a more Mech Soft diet (chopped solids; moistened foods) w/ thin liquids. General aspiration precautions including small sips/bites slowly. REFLUX precautions including Rest Breaks during meals/oral intake to allow for Esophageal clearing, moistened foods well, and alternating b/t bites/sips. Sitting upright/HOB elevated post meals for ~1 hour and at night when sleeping for REFLUX precautions.  D/w MD and pt the potential of a PPI currently; MD has ordered it per chart.    Recommend pt f/u w/ GI for assessment/management of REFLUX behavior and tx as indicated. Discussion and handouts given on REFLUX, impact of REFLUX on swallowing and breathing, behaviors to manage REFLUX, and foods/diet options/prep.  MD to reconsult ST services if any new needs while admitted. No further skilled ST services indicated currently. MD/NSG updated. Pt and Husband appreciative of Education information. Precautions posted in chart. Recommend f/u w/ Dietician as pt has been drinking Ensure at home per Husband. SLP Visit Diagnosis: Dysphagia, unspecified (R13.10) (suspect impact from Esophageal phase Dysmotility(multiple s/s during intake); deconditioned; Edentulous; illness/hospitalization including N/V; feeding support needed d/t weak UEs.)    Aspiration Risk   (reduced from an oropharyngeal phase standpoint but aspiration of REFLUX potential present)    Diet Recommendation   Thin;Dysphagia 3 (mechanical soft) (chopped meats; moistened foods) =  a more Mech Soft diet (chopped solids; moistened foods) w/ thin liquids. General aspiration precautions including small sips/bites slowly. REFLUX precautions including Rest Breaks during meals/oral intake to allow for Esophageal clearing, moistened foods well, and alternating b/t bites/sips. Sitting upright/HOB elevated  post meals for ~1 hour and at night when sleeping for REFLUX precautions.  Medication Administration: Whole meds with puree (IF any difficulty swallowing w/ Water)    Other Recommendations Recommended Consults: Consider GI evaluation;Consider esophageal assessment (Dietician) Oral Care Recommendations: Oral care BID;Staff/trained caregiver to provide oral care     Swallow Evaluation Recommendations  See above   Assistance Recommended at Discharge  FULL at meals for feeding support d/t Weak UEs  Functional Status Assessment Patient has had a recent decline in their functional status and demonstrates the ability to make significant improvements in function in a reasonable and predictable amount of time.  Frequency and Duration  (n/a)   (n/a)       Prognosis Prognosis for improved oropharyngeal function: Fair (-Good) Barriers/Prognosis Comment: suspect impact from Esophageal phase Dysmotility(multiple s/s during intake); deconditioned; Edentulous; illness/hospitalization including N/V; feeding support needed d/t weak UEs.      Swallow Study   General Date of Onset: 01/29/24 HPI: Pt is a 80 y.o. year old female with medical history of HTN, CKD, HLD, hypothyroidism presenting to the ED after Rheumatology office noted pt with severely low sodium.  Patient reports not eating well over the last several days w/ episode of N/V; states she has been taking her medications regularly.  She reports malaise.  On ROS, complains of right wrist pain and states she has family history of gout. On arrival to the ED patient was noted to be HDS stable.  Lab work and imaging obtained.  Blood work repeated and CBC with leukocytosis at 16.1, normal hemoglobin.  CMP with low Sodium at 112 unchanged from earlier today, potassium at 3.5, chloride at 77, bilirubin elevated at 2.2.    Chest Imaging: Peripheral posterior right lower lobe ovoid opacity measures 2.4  x 1.1 cm. This may represent an area of atelectasis or  pneumonia.  2. Dilated main pulmonary artery measures 4.3 cm, which can be seen  in the setting of pulmonary arterial hypertension. Queried right  hilar density corresponds to dilated and tortuous right pulmonary  arteries and pulmonary veins. No central pulmonary emboli.  3. A 12 mm right hilar lymph node, likely reactive.  Left atelectasis.  No pleural effusion. Type of Study: Bedside Swallow Evaluation Previous Swallow Assessment: none Diet Prior to this Study: Regular;Thin liquids (Level 0) Temperature Spikes Noted: No (wbc 13.9) Respiratory Status: Room air History of Recent Intubation: No Behavior/Cognition: Alert;Cooperative;Pleasant mood Oral Cavity Assessment: Within Functional Limits Oral Care Completed by SLP: Recent completion by staff Oral Cavity - Dentition: Edentulous (not wearing her dentures(typical)) Vision: Functional for self-feeding Self-Feeding Abilities: Able to feed self;Needs assist;Needs set up;Total assist (weak UEs/hands/wrist- recent) Patient Positioning: Upright in chair Baseline Vocal Quality: Normal Volitional Cough: Strong Volitional Swallow: Able to elicit    Oral/Motor/Sensory Function Overall Oral Motor/Sensory Function: Within functional limits   Ice Chips Ice chips: Within functional limits Presentation: Spoon (fed; 1 trial)   Thin Liquid Thin Liquid: Within functional limits Presentation: Cup;Self Fed;Straw (3 via Cup; ~6 ozs via straw)    Nectar Thick Nectar Thick Liquid: Not tested   Honey Thick Honey Thick Liquid: Not tested   Puree Puree: Within functional limits Presentation: Spoon (fed; 6 trials)   Solid     Solid: Impaired (min impact from Edentulous status) Presentation: Spoon (fed; 5 trials) Oral Phase Impairments: Impaired mastication (Edentulous status; fatigue) Oral Phase Functional Implications:  (time; moistening needs) Pharyngeal Phase Impairments:  (none)        Rachel Portugal, MS, CCC-SLP Speech Language  Pathologist Rehab Services; [facility] [phone] (ascom) Rachel Quinn 01/31/2024,5:41 PM

## 2024-01-31 NOTE — Evaluation (Signed)
 Physical Therapy Evaluation Patient Details Name: Rachel Quinn MRN: [id number] DOB: [birth year] Today's Date: 01/31/2024  History of Present Illness  Rachel Quinn is a 78yoF who is sent to ED from home by PCP regarding Na+: 112. Pt has been dealing with myalgias, nausea, recently. Pt saw [facility] 01/24/23 who recommended consult with rheumatology. PMH: HTN, HLD, hypoTSH,  nephrectomy, recent Rt wrist pain.  Clinical Impression  Pt in ED on evaluation, Na+ improved today: 125. Pt remains in significant pain, 9/10 resting, and quite tearful and guarded during transitions. Despite c/o widespread pain, biggest complain is centered to the posterior left knee. Pt requires Max-totalA +2 for all mobility and despite high motivation to perform her voiding on the commode, is really limited by pain overall. Husband in room for session, he decline use of interpreter, reports she understands English, however he does switch to native language periodically to facilitate points of clarification and detail. Will continue to follow.       If plan is discharge home, recommend the following: Two people to help with walking and/or transfers;Two people to help with bathing/dressing/bathroom;Assistance with cooking/housework;Help with stairs or ramp for entrance;Assist for transportation   Can travel by private vehicle   No    Equipment Recommendations None recommended by PT  Recommendations for Other Services  Rehab consult    Functional Status Assessment Patient has had a recent decline in their functional status and demonstrates the ability to make significant improvements in function in a reasonable and predictable amount of time.     Precautions / Restrictions Precautions Precautions: Fall Restrictions Weight Bearing Restrictions Per Provider Order: No      Mobility  Bed Mobility Overal bed mobility: Needs Assistance Bed Mobility: Supine to Sit     Supine to sit: Total assist, +2  for physical assistance, HOB elevated     General bed mobility comments: crying due to pain, slow moving, unable to provide much assistance    Transfers Overall transfer level: Needs assistance   Transfers: Sit to/from Stand, Bed to chair/wheelchair/BSC Sit to Stand: Max assist, +2 physical assistance     Squat pivot transfers: Max assist, +2 physical assistance     General transfer comment: pt unable to pick up feet; again, quite pain limited.    Ambulation/Gait Ambulation/Gait assistance:  (unabl;e)                Stairs            Wheelchair Mobility     Tilt Bed    Modified Rankin (Stroke Patients Only)       Balance                                             Pertinent Vitals/Pain Pain Assessment Pain Assessment: Faces Pain Score: 9  Faces Pain Scale: Hurts worst (crying during transfers and mobility.) Pain Location: wrist whole body Pain Intervention(s): Repositioned, Monitored during session, Relaxation    Home Living Family/patient expects to be discharged to:: Private residence Living Arrangements: Spouse/significant other Available Help at Discharge: Family;Available 24 hours/day Type of Home: House Home Access: Ramped entrance       Home Layout: One level Home Equipment: Shower seat;Grab bars - tub/shower;Rolling Walker (2 wheels);Wheelchair - manual;Cane - single point Additional Comments: typically no need for cane    Prior Function Prior Level of Function : Independent/Modified Independent  Mobility Comments: no AD use, denies falls       Extremity/Trunk Assessment                Communication        Cognition Arousal: Alert Behavior During Therapy: WFL for tasks assessed/performed   PT - Cognitive impairments: No apparent impairments                                 Cueing       General Comments      Exercises Other Exercises Other Exercises: EOB  sitting x10 minutes Other Exercises: self peri care on [facility]   Assessment/Plan    PT Assessment Patient needs continued PT services  PT Problem List Decreased strength;Decreased activity tolerance;Decreased range of motion;Decreased mobility;Decreased knowledge of use of DME       PT Treatment Interventions DME instruction;Gait training;Functional mobility training;Therapeutic activities;Therapeutic exercise;Balance training;Neuromuscular re-education;Patient/family education    PT Goals (Current goals can be found in the Care Plan section)  Acute Rehab PT Goals Patient Stated Goal: pain control, regain AMB status PT Goal Formulation: With patient Time For Goal Achievement: 02/14/24 Potential to Achieve Goals: Good    Frequency Min 2X/week     Co-evaluation               AM-PAC PT 6 Clicks Mobility  Outcome Measure Help needed turning from your back to your side while in a flat bed without using bedrails?: Total Help needed moving from lying on your back to sitting on the side of a flat bed without using bedrails?: Total Help needed moving to and from a bed to a chair (including a wheelchair)?: Total Help needed standing up from a chair using your arms (e.g., wheelchair or bedside chair)?: Total Help needed to walk in hospital room?: Total Help needed climbing 3-5 steps with a railing? : Total 6 Click Score: 6    End of Session   Activity Tolerance: Patient tolerated treatment well;No increased pain Patient left: in bed;with call bell/phone within reach;with nursing/sitter in room Nurse Communication: Mobility status PT Visit Diagnosis: Unsteadiness on feet (R26.81);Other abnormalities of gait and mobility (R26.89);Muscle weakness (generalized) (M62.81)    Time: 9095-9048 PT Time Calculation (min) (ACUTE ONLY): 47 min   Charges:   PT Evaluation $PT Eval High Complexity: 1 High PT Treatments $Therapeutic Activity: 8-22 mins PT General Charges $$ ACUTE PT  VISIT: 1 Visit        10:34 AM, 01/31/2024 Peggye JAYSON Linear, PT, DPT Physical Therapist - [facility]  [phone] (ASCOM)    Alisyn Lequire C 01/31/2024, 10:30 AM

## 2024-02-01 DIAGNOSIS — J189 Pneumonia, unspecified organism: Secondary | ICD-10-CM

## 2024-02-01 DIAGNOSIS — E876 Hypokalemia: Secondary | ICD-10-CM | POA: Diagnosis not present

## 2024-02-01 DIAGNOSIS — E871 Hypo-osmolality and hyponatremia: Secondary | ICD-10-CM | POA: Diagnosis not present

## 2024-02-01 LAB — CBC
HCT: 31.9 % — ABNORMAL LOW (ref 36.0–46.0)
Hemoglobin: 11.1 g/dL — ABNORMAL LOW (ref 12.0–15.0)
MCH: 30.8 pg (ref 26.0–34.0)
MCHC: 34.8 g/dL (ref 30.0–36.0)
MCV: 88.6 fL (ref 80.0–100.0)
Platelets: 259 K/uL (ref 150–400)
RBC: 3.6 MIL/uL — ABNORMAL LOW (ref 3.87–5.11)
RDW: 13.2 % (ref 11.5–15.5)
WBC: 10.2 K/uL (ref 4.0–10.5)
nRBC: 0 % (ref 0.0–0.2)

## 2024-02-01 LAB — BASIC METABOLIC PANEL WITH GFR
Anion gap: 10 (ref 5–15)
BUN: 16 mg/dL (ref 8–23)
CO2: 23 mmol/L (ref 22–32)
Calcium: 7.9 mg/dL — ABNORMAL LOW (ref 8.9–10.3)
Chloride: 96 mmol/L — ABNORMAL LOW (ref 98–111)
Creatinine, Ser: 0.66 mg/dL (ref 0.44–1.00)
GFR, Estimated: >60 mL/min
Glucose, Bld: 113 mg/dL — ABNORMAL HIGH (ref 70–99)
Potassium: 3.5 mmol/L (ref 3.5–5.1)
Sodium: 129 mmol/L — ABNORMAL LOW (ref 135–145)

## 2024-02-01 LAB — RHEUMATOID FACTOR: Rheumatoid fact SerPl-aCnc: 388 [IU]/mL — ABNORMAL HIGH

## 2024-02-01 LAB — CBG MONITORING, ED
Glucose-Capillary: 105 mg/dL — ABNORMAL HIGH (ref 70–99)
Glucose-Capillary: 98 mg/dL (ref 70–99)

## 2024-02-01 LAB — SODIUM
Sodium: 127 mmol/L — ABNORMAL LOW (ref 135–145)
Sodium: 131 mmol/L — ABNORMAL LOW (ref 135–145)
Sodium: 131 mmol/L — ABNORMAL LOW (ref 135–145)

## 2024-02-01 LAB — PROCALCITONIN: Procalcitonin: 0.71 ng/mL

## 2024-02-01 LAB — ANA: Anti Nuclear Antibody (ANA): NEGATIVE

## 2024-02-01 MED ORDER — ACETAMINOPHEN 325 MG PO TABS: 650.0000 mg | ORAL_TABLET | Freq: Four times a day (QID) | ORAL | Status: DC | PRN

## 2024-02-01 MED ORDER — SODIUM CHLORIDE 0.9 % IV SOLN
2.0000 g | INTRAVENOUS | Status: DC
  Administered 2024-02-01: 2 g via INTRAVENOUS
  Filled 2024-02-01: qty 20

## 2024-02-01 MED ORDER — ENOXAPARIN SODIUM 40 MG/0.4ML IJ SOSY
40.0000 mg | PREFILLED_SYRINGE | INTRAMUSCULAR | Status: DC
  Administered 2024-02-01: 40 mg via SUBCUTANEOUS
  Filled 2024-02-01: qty 0.4

## 2024-02-01 MED ORDER — ACETAMINOPHEN 650 MG RE SUPP: 650.0000 mg | Freq: Four times a day (QID) | RECTAL | Status: DC | PRN

## 2024-02-01 NOTE — ED Notes (Addendum)
 Upon assessment, IV on left forearm was noted to have infiltrated. IVF infusion stopped. IV taken out. Al-Sultani, MD and Tobie, [facility], made aware. Pt's arm is red around IV site and distal to IV site and warm to the touch. Pt is stating that arm hurts. Pt's arm elevated and icepack applied per instructions from [place], [facility].

## 2024-02-01 NOTE — Progress Notes (Signed)
 " [facility]  ROUNDING NOTE   Subjective:   Patient seen resting in bed Family at bedside Poor oral intake   Sodium 129   Objective:  Vital signs in last 24 hours:  Temp:  [98.2 F (36.8 C)-99.6 F (37.6 C)] 98.5 F (36.9 C) (01/15 1410) Pulse Rate:  [64-84] 64 (01/15 1410) Resp:  [15-20] 19 (01/15 1410) BP: (123-159)/(59-76) 129/65 (01/15 1410) SpO2:  [98 %-100 %] 100 % (01/15 1410)  Weight change:  Filed Weights   01/29/24 1700  Weight: 79.1 kg    Intake/Output: I/O last 3 completed shifts: In: 711.3 [I.V.:611.3; IV Piggyback:100] Out: -    Intake/Output this shift:  Total I/O In: -  Out: 900 [Urine:900]  Physical Exam: General: NAD  Head: Normocephalic, atraumatic. Moist oral mucosal membranes  Eyes: Anicteric  Lungs:  Clear to auscultation  Heart: Regular rate and rhythm  Abdomen:  Soft, nontender  Extremities:  Trace peripheral edema.  Neurologic: Awake, alert, conversant  Skin: Warm,dry, no rash       Basic Metabolic Panel: Recent Labs  Lab 01/29/24 1248 01/29/24 1702 01/29/24 2137 01/30/24 0050 01/30/24 0457 01/30/24 1023 01/30/24 1239 01/31/24 0331 01/31/24 0757 01/31/24 1416 01/31/24 2018 01/31/24 2301 02/01/24 0238 02/01/24 0442  NA 112* 112*   < > 114* 114* 114*   < > 125*  124*   < > 124* 126* 127* 127* 129*  K 3.4* 3.5  --   --  3.2*  --   --  3.6  --   --   --   --   --  3.5  CL 77* 77*  --   --  80*  --   --  92*  --   --   --   --   --  96*  CO2 27 24  --   --  24  --   --  24  --   --   --   --   --  23  GLUCOSE 121* 107*  --   --  94  --   --  109*  --   --   --   --   --  113*  BUN 14 13  --   --  13  --   --  13  --   --   --   --   --  16  CREATININE 0.67 0.66  --   --  0.73  --   --  0.70  --   --   --   --   --  0.66  CALCIUM  8.9 9.2  --   --  8.3*  --   --  8.1*  --   --   --   --   --  7.9*  MG  --   --   --  1.5*  --  1.6*  --  2.4  --   --   --   --   --   --   PHOS  --   --   --   --   --   --    --  2.9  --   --   --   --   --   --    < > = values in this interval not displayed.    Liver Function Tests: Recent Labs  Lab 01/29/24 1702 01/31/24 0331  AST 21  --   ALT 14  --   ALKPHOS  83  --   BILITOT 2.2*  --   PROT 7.1  --   ALBUMIN 3.5 2.9*   No results for input(s): LIPASE, AMYLASE in the last 168 hours. No results for input(s): AMMONIA in the last 168 hours.  CBC: Recent Labs  Lab 01/29/24 1248 01/29/24 1702 01/30/24 0457 01/31/24 0331 02/01/24 0442  WBC 14.5* 16.1* 14.0* 13.9* 10.2  NEUTROABS 12.8*  --   --   --   --   HGB 12.4 12.2 11.0* 11.1* 11.1*  HCT 35.2* 32.9* 30.1* 30.9* 31.9*  MCV 88.6 84.1 85.3 86.8 88.6  PLT 252.0 237 207 243 259    Cardiac Enzymes: Recent Labs  Lab 01/29/24 2137  CKTOTAL 88    BNP: Invalid input(s): POCBNP  CBG: Recent Labs  Lab 02/01/24 0741 02/01/24 1205  GLUCAP 105* 98    Microbiology: Results for orders placed or performed during the hospital encounter of 01/29/24  Resp panel by RT-PCR (RSV, Flu A&B, Covid) Anterior Nasal Swab     Status: None   Collection Time: 01/30/24  7:59 PM   Specimen: Anterior Nasal Swab  Result Value Ref Range Status   SARS Coronavirus 2 by RT PCR NEGATIVE NEGATIVE Final    Comment: (NOTE) SARS-CoV-2 target nucleic acids are NOT DETECTED.  The SARS-CoV-2 RNA is generally detectable in upper respiratory specimens during the acute phase of infection. The lowest concentration of SARS-CoV-2 viral copies this assay can detect is 138 copies/mL. A negative result does not preclude SARS-Cov-2 infection and should not be used as the sole basis for treatment or other patient management decisions. A negative result may occur with  improper specimen collection/handling, submission of specimen other than nasopharyngeal swab, presence of viral mutation(s) within the areas targeted by this assay, and inadequate number of viral copies(<138 copies/mL). A negative result must be  combined with clinical observations, patient history, and epidemiological information. The expected result is Negative.  Fact Sheet for Patients:  bloggercourse.com  Fact Sheet for Healthcare Providers:  seriousbroker.it  This test is no t yet approved or cleared by the [place]  FDA and  has been authorized for detection and/or diagnosis of SARS-CoV-2 by FDA under an Emergency Use Authorization (EUA). This EUA will remain  in effect (meaning this test can be used) for the duration of the COVID-19 declaration under Section 564(b)(1) of the Act, 21 U.S.C.section 360bbb-3(b)(1), unless the authorization is terminated  or revoked sooner.       Influenza A by PCR NEGATIVE NEGATIVE Final   Influenza B by PCR NEGATIVE NEGATIVE Final    Comment: (NOTE) The Xpert Xpress SARS-CoV-2/FLU/RSV plus assay is intended as an aid in the diagnosis of influenza from Nasopharyngeal swab specimens and should not be used as a sole basis for treatment. Nasal washings and aspirates are unacceptable for Xpert Xpress SARS-CoV-2/FLU/RSV testing.  Fact Sheet for Patients: bloggercourse.com  Fact Sheet for Healthcare Providers: seriousbroker.it  This test is not yet approved or cleared by the [place]  FDA and has been authorized for detection and/or diagnosis of SARS-CoV-2 by FDA under an Emergency Use Authorization (EUA). This EUA will remain in effect (meaning this test can be used) for the duration of the COVID-19 declaration under Section 564(b)(1) of the Act, 21 U.S.C. section 360bbb-3(b)(1), unless the authorization is terminated or revoked.     Resp Syncytial Virus by PCR NEGATIVE NEGATIVE Final    Comment: (NOTE) Fact Sheet for Patients: bloggercourse.com  Fact Sheet for Healthcare Providers: seriousbroker.it  This test  is not yet  approved or cleared by the [place]  FDA and has been authorized for detection and/or diagnosis of SARS-CoV-2 by FDA under an Emergency Use Authorization (EUA). This EUA will remain in effect (meaning this test can be used) for the duration of the COVID-19 declaration under Section 564(b)(1) of the Act, 21 U.S.C. section 360bbb-3(b)(1), unless the authorization is terminated or revoked.  Performed at [facility], [address] [place], [place] [address]     Coagulation Studies: No results for input(s): LABPROT, INR in the last 72 hours.  Urinalysis: Recent Labs    01/29/24 2137  COLORURINE YELLOW*  LABSPEC >1.046*  PHURINE 7.0  GLUCOSEU NEGATIVE  HGBUR NEGATIVE  BILIRUBINUR NEGATIVE  KETONESUR NEGATIVE  PROTEINUR NEGATIVE  NITRITE NEGATIVE  LEUKOCYTESUR NEGATIVE      Imaging: CT CHEST W CONTRAST Result Date: 01/30/2024 CLINICAL DATA:  Right hilar opacity on same day chest radiograph EXAM: CT CHEST WITH CONTRAST TECHNIQUE: Multidetector CT imaging of the chest was performed during intravenous contrast administration. RADIATION DOSE REDUCTION: This exam was performed according to the departmental dose-optimization program which includes automated exposure control, adjustment of the mA and/or kV according to patient size and/or use of iterative reconstruction technique. CONTRAST:  75mL OMNIPAQUE  IOHEXOL  300 MG/ML  SOLN COMPARISON:  Same day chest radiograph FINDINGS: Cardiovascular: Normal heart size. No significant pericardial fluid/thickening. Dilated main pulmonary artery measures 4.3 cm. Dilated and tortuous right pulmonary arteries and pulmonary veins. No central pulmonary emboli. Coronary artery calcifications and aortic atherosclerosis. Mediastinum/Nodes: Thyroidectomy. Small hiatal hernia. 12 mm right hilar lymph node (2:59). Lungs/Pleura: The central airways are patent. Peripheral posterior right lower lobe ovoid opacity measures 2.4 x 1.1 cm (3:65). Left  lower lobe subsegmental atelectasis. Scattered patchy ground-glass opacities. No pneumothorax. No pleural effusion. Upper abdomen: Punctate calcification in the spleen, likely sequela of granulomatous infection. Musculoskeletal: Multilevel degenerative changes of the thoracic spine. Age indeterminate anterior wedging of T12. Mild superior endplate compression deformity of T1, T5. Old left rib fractures. IMPRESSION: 1. Peripheral posterior right lower lobe ovoid opacity measures 2.4 x 1.1 cm. This may represent an area of atelectasis or pneumonia. 2. Dilated main pulmonary artery measures 4.3 cm, which can be seen in the setting of pulmonary arterial hypertension. Queried right hilar density corresponds to dilated and tortuous right pulmonary arteries and pulmonary veins. No central pulmonary emboli. 3. A 12 mm right hilar lymph node, likely reactive. 4. Age indeterminate anterior wedging of T12. Mild superior endplate compression deformity of T1, T5. 5.  Aortic Atherosclerosis (ICD10-I70.0). Electronically Signed   By: Limin  Xu M.D.   On: 01/30/2024 15:43     Medications:    cefTRIAXone  (ROCEPHIN )  IV     sodium chloride  (hypertonic) 20 mL/hr at 02/01/24 9441    atenolol   25 mg Oral Daily   atorvastatin   10 mg Oral Daily   azithromycin   500 mg Oral Daily   calcium  carbonate  1 tablet Oral BID WC   enoxaparin  (LOVENOX ) injection  40 mg Subcutaneous Q24H   feeding supplement  237 mL Oral BID BM   heparin   5,000 Units Subcutaneous Q8H   ipratropium-albuterol   3 mL Nebulization Q6H   levothyroxine   75 mcg Oral QAC breakfast   losartan   25 mg Oral Daily   pantoprazole   40 mg Oral Daily   sodium chloride  flush  3 mL Intravenous Q12H   acetaminophen  **OR** acetaminophen , morphine  injection, ondansetron  **OR** ondansetron  (ZOFRAN ) IV, phenol, senna-docusate  Assessment/ Plan:  Ms. Airyn  L Quinn is a 79 y.o.  female with past medical history including hypertension, hypothyroidism,  hyperlipidemia, who was admitted to [facility] on 01/29/2024 for Hyponatremia [E87.1]   Severe acute hyponatremia, serum sodium on ED arrival 112.  Offending agent can include chlorthalidone .  Patient received 500 mL liter normal saline bolus in emergency department followed by lactated Ringer's.  Little response in sodium level, 114.  CT chest shows possible pneumonia.  Restarted hypertonic saline yesterday. S sodium 129 today. IVF have been stopped. Will monitor levels for now. Patient encouraged to maintain oral intake   Hypertension, essential.  Home regimen includes chlorthalidone .  Currently held due to hyponatremia.  Currently prescribed atenolol  and losartan . Blood pressure remains stable    LOS: 2 Amyiah Gaba 1/15/20262:28 PM   "

## 2024-02-01 NOTE — ED Notes (Signed)
 Lab called second time for lab draw.

## 2024-02-01 NOTE — Consult Note (Addendum)
 Pharmacy Consulted for monitoring of Hypertonic Saline  Rachel Quinn is a 74 yoF presenting with concerns for hyponatremia. Past medical history notable for  HTN, HLD,  and hypothyroidism. Patient arrived following an appointment with PCP with concerns for muscle tightness. Was recommended to go to the ER for evaluation following resultant sodium level 112. Per PCP note, no apparent concerns for AMS or neurologic deficits. Based on fill history, patient takes atenolol -chlorthalidone . Recent baseline sodium in low 130s.  MIVF:  NaCL 3% restarted 1/15 @ 1800  Goal of Therapy:  Goal of correction per 24 hours IF sodium rises > 4 mEq/L over 2 hours   > 6 mEq/L over 4 hours - Pharmacy will evaluate and contact MD per consult.  Date Time Na  1/14 1416 124  1/14 2018 126  1/14 2301 127  1/15 0238 127  1/15 0442 129   Plan: Sodium has risen 5 mEq/L over 14 hours which is a safe rate of correction Continue 3% NaCl @ 25mL/hr Continue to monitor labs q4h  Thank you for involving pharmacy in this patient's care.   Will M. Lenon, PharmD, BCPS Clinical Pharmacist 02/01/2024 9:08 AM

## 2024-02-01 NOTE — Progress Notes (Signed)
 " PROGRESS NOTE    Rachel Quinn  FMW:[id number] DOB: [birth year] DOA: 01/29/2024 PCP: Wendee Lynwood HERO, NP    Brief Narrative:   Patient is a 79 year old female with PMHx of HTN, HLD, hypothyroidism who presented to the ED on 01/29/2024 from [facility] Medicine office after outpatient labs revealed severe hyponatremia. Reportedly, the patient was in her usual state of health last week but experienced decreased appetite, nausea, vomiting, and joint pains over the last few days.  On presentation, vital signs were stable.  CBC showed leukocytosis to 16.1.  CMP showed sodium 112, chloride 77, T. bili 2.2.  UA unremarkable CK WNL.  CXR showed increased density at the right hilum with recommendation to obtain CT chest CT abd pelvis without acute abnormalities  The patient was admitted for management of acute hyponatremia  Assessment and Plan:  # Hyponatremia - Na 112 on admission - Nephrology following - Was initially on 3% saline then switched to NS. Now back on NS 3% per nephrology - Na improved to 131  # Hypomagensemia - resolved  # Abnormal CXR # Possible pulm arterial hypertension - CXR (1/12) noted increased density at the right hilum with concern for mass - CT chest (1/13) showed that the right hilar density corresponds to dilated and tortuous right pulmonary artery and pulmonary veins, noting dilated main pulmonary artery measuring 4.3 cm which can be seen in the setting of pulmonary arterial hypertension  # Possible CAP - CT chest showed RLL opacity 2.4 x 1.1 cm c/f pneumonia vs atelectasis, LLL subsegmental atelectasis with scattered patchy GGO - Afebrile, leukocytosis resolved - Procalcitonin 0.7 - Continue IV Rocephin  and PO azithromycin    # Hypokalemia - resolved  # HTN - Home atenolol -clorthalidone pill discontinued  - Continue atenolol  separately - Continue losartan   # HLD - Continue home Lipitor  # Hypothyroidism - Continue home Synthroid   # Arthritic-like  pain Patient complaining of arthritic like pain involving different joints.  She was recently referred to see a rheumatologist by her PCP.  Uric acid was tested by PCP and it was normal.  Right wrist imaging was done on admission for concern of some swelling and it was negative for any osseous abnormality. - Outpatient rheumatology evaluation - Supportive care   # Sore throat Patient has developing some sore throat and early signs of congestion. - Respiratory panel negative - Supportive care  # Esophageal dysphagia # Possible reflux - Evaluated by SLP, noted to exhibit s/s of esophageal dysmotility with globus sensation - Continue PO protonix  40 mg daily - Referral to outpatient GI for evaluation of esophageal dysphagia   Scheduled Meds:  atenolol   25 mg Oral Daily   atorvastatin   10 mg Oral Daily   azithromycin   500 mg Oral Daily   calcium  carbonate  1 tablet Oral BID WC   enoxaparin  (LOVENOX ) injection  40 mg Subcutaneous Q24H   feeding supplement  237 mL Oral BID BM   heparin   5,000 Units Subcutaneous Q8H   ipratropium-albuterol   3 mL Nebulization Q6H   levothyroxine   75 mcg Oral QAC breakfast   losartan   25 mg Oral Daily   pantoprazole   40 mg Oral Daily   sodium chloride  flush  3 mL Intravenous Q12H   Continuous Infusions:  cefTRIAXone  (ROCEPHIN )  IV     sodium chloride  (hypertonic) 20 mL/hr at 02/01/24 0558   PRN Meds:.acetaminophen  **OR** acetaminophen , morphine  injection, ondansetron  **OR** ondansetron  (ZOFRAN ) IV, phenol, senna-docusate  Current Outpatient Medications  Medication Instructions   acetaminophen  (  TYLENOL ) 500 mg, Oral, Every 6 hours PRN   albuterol  (VENTOLIN  HFA) 108 (90 Base) MCG/ACT inhaler 2 puffs, Inhalation, Every 4 hours PRN   atenolol -chlorthalidone  (TENORETIC ) 50-25 MG tablet 1 tablet, Oral, Daily   atorvastatin  (LIPITOR) 10 mg, Oral, Daily   calcium  carbonate (OS-CAL - DOSED IN MG OF ELEMENTAL CALCIUM ) 1250 (500 Ca) MG tablet 1 tablet    levothyroxine  (SYNTHROID ) 75 mcg, Oral, Daily   potassium citrate  (UROCIT-K ) 10 MEQ (1080 MG) SR tablet 10 mEq, Oral, Daily   predniSONE  (DELTASONE ) 10 MG tablet Take 4 pills once daily by mouth for 3 days, then 3 pills daily for 3 days, then 2 pills daily for 3 days then 1 pill daily for 3 days then stop   tiZANidine  (ZANAFLEX ) 2 mg, Oral, At bedtime PRN   traMADol  (ULTRAM ) 50 mg, Oral, Every 6 hours PRN    DVT prophylaxis: enoxaparin  (LOVENOX ) injection 40 mg Start: 02/01/24 2200 heparin  injection 5,000 Units Start: 01/29/24 2200   Code Status:   Code Status: Full Code  Family Communication: Discussed with daughter at bedside  Disposition Plan: Likely SNF pending clinical improvement PT - Follow Up Recommendations: Skilled nursing-short term rehab (<3 hours/day) - PT equipment: None recommended by PT OT - Follow Up Recommendations: Acute inpatient rehab (3hours/day) -    Level of care: Stepdown  Consultants:  Nephrology   Subjective: Patient examined at bedside.. States she feels better today. Had IV infiltrate while infusing 3% saline in left arm with swelling and pain. Otherwise she reports a nonproductive cough but denies chest pain or shortness of breath.  Objective: Vitals:   02/01/24 0911 02/01/24 0959 02/01/24 1230 02/01/24 1410  BP: 137/66 (!) 143/68 123/63 129/65  Pulse: 70 70 65 64  Resp: 17  15 19   Temp:    98.5 F (36.9 C)  TempSrc:    Oral  SpO2: 100%  100% 100%  Weight:      Height:        Intake/Output Summary (Last 24 hours) at 02/01/2024 1427 Last data filed at 02/01/2024 1248 Gross per 24 hour  Intake 296.59 ml  Output 900 ml  Net -603.41 ml   Filed Weights   01/29/24 1700  Weight: 79.1 kg    Examination:  Gen: NAD, awake, A&Ox3 HEENT: NCAT Neck: Supple CV: RRR, no murmurs Resp: normal WOB, CTAB, no w/r/r Abd: Soft, NTND, no guarding, BS normoactive Ext: No LE edema, LUE with swelling and tenderness at site of IV infiltration  Skin:  Warm, dry Neuro: ROM of upper extremities limited by pain, sensation intact bilaterally Psych: Calm, cooperative, appropriate affect    Data Reviewed: I have personally reviewed following labs and imaging studies  CBC: Recent Labs  Lab 01/29/24 1248 01/29/24 1702 01/30/24 0457 01/31/24 0331 02/01/24 0442  WBC 14.5* 16.1* 14.0* 13.9* 10.2  NEUTROABS 12.8*  --   --   --   --   HGB 12.4 12.2 11.0* 11.1* 11.1*  HCT 35.2* 32.9* 30.1* 30.9* 31.9*  MCV 88.6 84.1 85.3 86.8 88.6  PLT 252.0 237 207 243 259   Basic Metabolic Panel: Recent Labs  Lab 01/29/24 1248 01/29/24 1702 01/29/24 2137 01/30/24 0050 01/30/24 0457 01/30/24 1023 01/30/24 1239 01/31/24 0331 01/31/24 0757 01/31/24 1416 01/31/24 2018 01/31/24 2301 02/01/24 0238 02/01/24 0442  NA 112* 112*   < > 114* 114* 114*   < > 125*  124*   < > 124* 126* 127* 127* 129*  K 3.4* 3.5  --   --  3.2*  --   --  3.6  --   --   --   --   --  3.5  CL 77* 77*  --   --  80*  --   --  92*  --   --   --   --   --  96*  CO2 27 24  --   --  24  --   --  24  --   --   --   --   --  23  GLUCOSE 121* 107*  --   --  94  --   --  109*  --   --   --   --   --  113*  BUN 14 13  --   --  13  --   --  13  --   --   --   --   --  16  CREATININE 0.67 0.66  --   --  0.73  --   --  0.70  --   --   --   --   --  0.66  CALCIUM  8.9 9.2  --   --  8.3*  --   --  8.1*  --   --   --   --   --  7.9*  MG  --   --   --  1.5*  --  1.6*  --  2.4  --   --   --   --   --   --   PHOS  --   --   --   --   --   --   --  2.9  --   --   --   --   --   --    < > = values in this interval not displayed.   GFR: Estimated Creatinine Clearance: 54.8 mL/min (by C-G formula based on SCr of 0.66 mg/dL). Liver Function Tests: Recent Labs  Lab 01/29/24 1702 01/31/24 0331  AST 21  --   ALT 14  --   ALKPHOS 83  --   BILITOT 2.2*  --   PROT 7.1  --   ALBUMIN 3.5 2.9*   No results for input(s): LIPASE, AMYLASE in the last 168 hours. No results for input(s):  AMMONIA in the last 168 hours. Coagulation Profile: No results for input(s): INR, PROTIME in the last 168 hours. Cardiac Enzymes: Recent Labs  Lab 01/29/24 2137  CKTOTAL 88   BNP (last 3 results) No results for input(s): PROBNP in the last 8760 hours. HbA1C: No results for input(s): HGBA1C in the last 72 hours. CBG: Recent Labs  Lab 02/01/24 0741 02/01/24 1205  GLUCAP 105* 98   Lipid Profile: No results for input(s): CHOL, HDL, LDLCALC, TRIG, CHOLHDL, LDLDIRECT in the last 72 hours. Thyroid  Function Tests: Recent Labs    01/30/24 1615  TSH 9.050*   Anemia Panel: No results for input(s): VITAMINB12, FOLATE, FERRITIN, TIBC, IRON, RETICCTPCT in the last 72 hours. Sepsis Labs: Recent Labs  Lab 02/01/24 0442  PROCALCITON 0.71    Recent Results (from the past 240 hours)  Resp panel by RT-PCR (RSV, Flu A&B, Covid) Anterior Nasal Swab     Status: None   Collection Time: 01/30/24  7:59 PM   Specimen: Anterior Nasal Swab  Result Value Ref Range Status   SARS Coronavirus 2 by RT PCR NEGATIVE NEGATIVE Final    Comment: (NOTE) SARS-CoV-2 target nucleic acids are  NOT DETECTED.  The SARS-CoV-2 RNA is generally detectable in upper respiratory specimens during the acute phase of infection. The lowest concentration of SARS-CoV-2 viral copies this assay can detect is 138 copies/mL. A negative result does not preclude SARS-Cov-2 infection and should not be used as the sole basis for treatment or other patient management decisions. A negative result may occur with  improper specimen collection/handling, submission of specimen other than nasopharyngeal swab, presence of viral mutation(s) within the areas targeted by this assay, and inadequate number of viral copies(<138 copies/mL). A negative result must be combined with clinical observations, patient history, and epidemiological information. The expected result is Negative.  Fact Sheet for  Patients:  bloggercourse.com  Fact Sheet for Healthcare Providers:  seriousbroker.it  This test is no t yet approved or cleared by the [place]  FDA and  has been authorized for detection and/or diagnosis of SARS-CoV-2 by FDA under an Emergency Use Authorization (EUA). This EUA will remain  in effect (meaning this test can be used) for the duration of the COVID-19 declaration under Section 564(b)(1) of the Act, 21 U.S.C.section 360bbb-3(b)(1), unless the authorization is terminated  or revoked sooner.       Influenza A by PCR NEGATIVE NEGATIVE Final   Influenza B by PCR NEGATIVE NEGATIVE Final    Comment: (NOTE) The Xpert Xpress SARS-CoV-2/FLU/RSV plus assay is intended as an aid in the diagnosis of influenza from Nasopharyngeal swab specimens and should not be used as a sole basis for treatment. Nasal washings and aspirates are unacceptable for Xpert Xpress SARS-CoV-2/FLU/RSV testing.  Fact Sheet for Patients: bloggercourse.com  Fact Sheet for Healthcare Providers: seriousbroker.it  This test is not yet approved or cleared by the [place]  FDA and has been authorized for detection and/or diagnosis of SARS-CoV-2 by FDA under an Emergency Use Authorization (EUA). This EUA will remain in effect (meaning this test can be used) for the duration of the COVID-19 declaration under Section 564(b)(1) of the Act, 21 U.S.C. section 360bbb-3(b)(1), unless the authorization is terminated or revoked.     Resp Syncytial Virus by PCR NEGATIVE NEGATIVE Final    Comment: (NOTE) Fact Sheet for Patients: bloggercourse.com  Fact Sheet for Healthcare Providers: seriousbroker.it  This test is not yet approved or cleared by the [place]  FDA and has been authorized for detection and/or diagnosis of SARS-CoV-2 by FDA under an Emergency  Use Authorization (EUA). This EUA will remain in effect (meaning this test can be used) for the duration of the COVID-19 declaration under Section 564(b)(1) of the Act, 21 U.S.C. section 360bbb-3(b)(1), unless the authorization is terminated or revoked.  Performed at [facility], [address] [place], [place] [address]      Radiology Studies: CT CHEST W CONTRAST Result Date: 01/30/2024 CLINICAL DATA:  Right hilar opacity on same day chest radiograph EXAM: CT CHEST WITH CONTRAST TECHNIQUE: Multidetector CT imaging of the chest was performed during intravenous contrast administration. RADIATION DOSE REDUCTION: This exam was performed according to the departmental dose-optimization program which includes automated exposure control, adjustment of the mA and/or kV according to patient size and/or use of iterative reconstruction technique. CONTRAST:  75mL OMNIPAQUE  IOHEXOL  300 MG/ML  SOLN COMPARISON:  Same day chest radiograph FINDINGS: Cardiovascular: Normal heart size. No significant pericardial fluid/thickening. Dilated main pulmonary artery measures 4.3 cm. Dilated and tortuous right pulmonary arteries and pulmonary veins. No central pulmonary emboli. Coronary artery calcifications and aortic atherosclerosis. Mediastinum/Nodes: Thyroidectomy. Small hiatal hernia. 12 mm right hilar lymph node (2:59). Lungs/Pleura:  The central airways are patent. Peripheral posterior right lower lobe ovoid opacity measures 2.4 x 1.1 cm (3:65). Left lower lobe subsegmental atelectasis. Scattered patchy ground-glass opacities. No pneumothorax. No pleural effusion. Upper abdomen: Punctate calcification in the spleen, likely sequela of granulomatous infection. Musculoskeletal: Multilevel degenerative changes of the thoracic spine. Age indeterminate anterior wedging of T12. Mild superior endplate compression deformity of T1, T5. Old left rib fractures. IMPRESSION: 1. Peripheral posterior right lower lobe ovoid  opacity measures 2.4 x 1.1 cm. This may represent an area of atelectasis or pneumonia. 2. Dilated main pulmonary artery measures 4.3 cm, which can be seen in the setting of pulmonary arterial hypertension. Queried right hilar density corresponds to dilated and tortuous right pulmonary arteries and pulmonary veins. No central pulmonary emboli. 3. A 12 mm right hilar lymph node, likely reactive. 4. Age indeterminate anterior wedging of T12. Mild superior endplate compression deformity of T1, T5. 5.  Aortic Atherosclerosis (ICD10-I70.0). Electronically Signed   By: Limin  Xu M.D.   On: 01/30/2024 15:43    Scheduled Meds:  atenolol   25 mg Oral Daily   atorvastatin   10 mg Oral Daily   azithromycin   500 mg Oral Daily   calcium  carbonate  1 tablet Oral BID WC   enoxaparin  (LOVENOX ) injection  40 mg Subcutaneous Q24H   feeding supplement  237 mL Oral BID BM   heparin   5,000 Units Subcutaneous Q8H   ipratropium-albuterol   3 mL Nebulization Q6H   levothyroxine   75 mcg Oral QAC breakfast   losartan   25 mg Oral Daily   pantoprazole   40 mg Oral Daily   sodium chloride  flush  3 mL Intravenous Q12H   Continuous Infusions:  cefTRIAXone  (ROCEPHIN )  IV     sodium chloride  (hypertonic) 20 mL/hr at 02/01/24 0558     Unresulted Labs (From admission, onward)     Start     Ordered   02/02/24 0500  CBC  Tomorrow morning,   R        02/01/24 1432   02/02/24 0500  Basic metabolic panel with GFR  Tomorrow morning,   R        02/01/24 1432   01/30/24 1501  Sodium  Every 4 hours,   TIMED      01/30/24 0903             LOS:  LOS: 2 days   Time Spent: 445 minutes  Trenace Coughlin Al-Sultani, MD [facility]  If 7PM-7AM, please contact night-coverage  02/01/2024, 2:27 PM      "

## 2024-02-02 DIAGNOSIS — E871 Hypo-osmolality and hyponatremia: Secondary | ICD-10-CM | POA: Diagnosis not present

## 2024-02-02 LAB — CBC
HCT: 34.4 % — ABNORMAL LOW (ref 36.0–46.0)
Hemoglobin: 12.1 g/dL (ref 12.0–15.0)
MCH: 31.3 pg (ref 26.0–34.0)
MCHC: 35.2 g/dL (ref 30.0–36.0)
MCV: 89.1 fL (ref 80.0–100.0)
Platelets: 309 K/uL (ref 150–400)
RBC: 3.86 MIL/uL — ABNORMAL LOW (ref 3.87–5.11)
RDW: 13.5 % (ref 11.5–15.5)
WBC: 7.9 K/uL (ref 4.0–10.5)
nRBC: 0 % (ref 0.0–0.2)

## 2024-02-02 LAB — BASIC METABOLIC PANEL WITH GFR
Anion gap: 11 (ref 5–15)
BUN: 18 mg/dL (ref 8–23)
CO2: 21 mmol/L — ABNORMAL LOW (ref 22–32)
Calcium: 8.4 mg/dL — ABNORMAL LOW (ref 8.9–10.3)
Chloride: 97 mmol/L — ABNORMAL LOW (ref 98–111)
Creatinine, Ser: 0.72 mg/dL (ref 0.44–1.00)
GFR, Estimated: >60 mL/min
Glucose, Bld: 120 mg/dL — ABNORMAL HIGH (ref 70–99)
Potassium: 3.7 mmol/L (ref 3.5–5.1)
Sodium: 129 mmol/L — ABNORMAL LOW (ref 135–145)

## 2024-02-02 LAB — SODIUM
Sodium: 130 mmol/L — ABNORMAL LOW (ref 135–145)
Sodium: 130 mmol/L — ABNORMAL LOW (ref 135–145)
Sodium: 130 mmol/L — ABNORMAL LOW (ref 135–145)

## 2024-02-02 LAB — CBG MONITORING, ED: Glucose-Capillary: 122 mg/dL — ABNORMAL HIGH (ref 70–99)

## 2024-02-02 MED ORDER — SENNA 8.6 MG PO TABS
1.0000 | ORAL_TABLET | Freq: Every day | ORAL | Status: DC
  Administered 2024-02-02: 8.6 mg via ORAL
  Filled 2024-02-02: qty 1

## 2024-02-02 MED ORDER — SENNA 8.6 MG PO TABS: 1.0000 | ORAL_TABLET | Freq: Every day | ORAL | 0 refills | Status: DC | PRN

## 2024-02-02 MED ORDER — POLYETHYLENE GLYCOL 3350 17 G PO PACK
17.0000 g | PACK | Freq: Every day | ORAL | Status: DC
  Administered 2024-02-02: 17 g via ORAL
  Filled 2024-02-02: qty 1

## 2024-02-02 MED ORDER — LOSARTAN POTASSIUM 25 MG PO TABS: 25.0000 mg | ORAL_TABLET | Freq: Every day | ORAL | 0 refills | Status: DC

## 2024-02-02 MED ORDER — ENSURE PLUS HIGH PROTEIN PO LIQD: 237.0000 mL | Freq: Two times a day (BID) | ORAL | Status: AC

## 2024-02-02 MED ORDER — ATENOLOL 50 MG PO TABS: 50.0000 mg | ORAL_TABLET | Freq: Every day | ORAL | 0 refills | Status: DC

## 2024-02-02 MED ORDER — AMOXICILLIN-POT CLAVULANATE 875-125 MG PO TABS: 1.0000 | ORAL_TABLET | Freq: Two times a day (BID) | ORAL | 0 refills | Status: AC

## 2024-02-02 MED ORDER — PANTOPRAZOLE SODIUM 40 MG PO TBEC: 40.0000 mg | DELAYED_RELEASE_TABLET | Freq: Every day | ORAL | 0 refills | Status: DC

## 2024-02-02 MED ORDER — POLYETHYLENE GLYCOL 3350 17 G PO PACK: 17.0000 g | PACK | Freq: Every day | ORAL | 0 refills | Status: AC | PRN

## 2024-02-02 MED ORDER — AZITHROMYCIN 500 MG PO TABS: 500.0000 mg | ORAL_TABLET | Freq: Every day | ORAL | 0 refills | Status: AC

## 2024-02-02 NOTE — Progress Notes (Signed)
" ° °  Brief Progress Note   _____________________________________________________________________________________________________________  Patient Name: Rachel Quinn Patient DOB: 02/28/1945 Date: 02/02/2024.     Data: Na this AM was 130, V/S looked good on Room Air, hasn't been on 3% NS since yesterday at 1610. They are currently Stepdown LOC since 1/12 and holding in ER.     Action: Sent message to Dr Al-Sultani to see if patient could be downgraded.    Response:  Dr Mosie placed new order for Progressive LOC.   _____________________________________________________________________________________________________________  [facility] RN Expeditor Remberto Lienhard Please contact us  directly via secure chat (search for [facility]) or by calling us  at [phone] [facility]).  "

## 2024-02-02 NOTE — Progress Notes (Signed)
 The beneficiary has a mobility limitation that significantly impairs his/her ability to participate in one or more mobility-related activities of daily living (MRADL) in the home. The patient is able to safely use the walker. The functional mobility deficit can be sufficiently resolved by use of walker.

## 2024-02-02 NOTE — ED Notes (Signed)
 Lab called to redraw labs and sodium lab.
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
## 2024-02-02 NOTE — ED Notes (Signed)
 Pt ambulatory to restroom with assist from family.

## 2024-02-02 NOTE — Discharge Summary (Signed)
 " Physician Discharge Summary   Patient: Rachel Quinn MRN: [id number] DOB: Apr 01, 1945  Admit date:     01/29/2024  Discharge date: 02/02/24  Discharge Physician: Duffy Al-Sultani   PCP: Wendee Lynwood HERO, NP   Recommendations at discharge:  {Tip this will not be part of the note when signed- Example include specific recommendations for outpatient follow-up, pending tests to follow-up on. (Optional):26781} Follow-up with PCP within 3 to 5 days of discharge.  Repeat CBC, CMP, Magnesium , two-view chest x-ray.  Monitor sodium, reinforce fluid restriction.  Monitor BP after discontinuation of chlorthalidone  and initiation of Losartan . Evaluation of possible pulmonary hypertension noted on CT chest Follow-up with gastroenterology for esophageal dysphagia evaluation Follo-up with rheumatology for evaluation of polyarthraglias  Discharge Diagnoses: Principal Problem:   Hyponatremia Active Problems:   Essential hypertension   Hypokalemia   Hypomagnesemia   Hyperlipidemia   Hypothyroidism, postsurgical   Abnormal chest x-ray   Arthritic-like pain   Sore throat   Esophageal dysphagia  Resolved Problems:   * No resolved hospital problems. [place] [facility] Course:  Patient is a 79 year old female with PMHx of HTN, HLD, hypothyroidism who presented to the ED on 01/29/2024 from [facility] office after outpatient labs revealed severe hyponatremia. Reportedly, the patient was in her usual state of health last week but experienced decreased appetite, nausea, vomiting, and joint pains over the last few days.  On presentation, vital signs were stable.  CBC showed leukocytosis to 16.1.  CMP showed sodium 112, chloride 77, T. bili 2.2.  UA unremarkable. CK WNL.  CXR showed increased density at the right hilum with recommendation to obtain CT chest CT abd pelvis without acute abnormalities   The patient was admitted for management of acute hyponatremia.  # Hyponatremia, acute, severe - Na  112 on admission.  Hyponatremia workup consistent with hypovolemic hyponatremia with sodium diuretics (chlorthalidone ), nausea, vomiting, decreased p.o. intake - Nephrology consulted. - Was initially on 3% saline then switched to NS. Required to go back on 3% saline, which was stopped 1/15 AM after Na improved to 129. - Na 130 at time of discharge. Nephrology cleared for discharge with recommendations for a daily fluid restriction to 32-40 oz.  - Follow up with PCP within 3-5 days to recheck sodium levels   # Abnormal CXR # Possible pulm arterial hypertension - CXR (1/12) noted increased density at the right hilum with concern for mass - A CT chest (1/13) was obtained to further evaluate CXR findings which showed that the right hilar density corresponds to dilated and tortuous right pulmonary artery and pulmonary veins, noting dilated main pulmonary artery measuring 4.3 cm which can be seen in the setting of pulmonary arterial hypertension - Recommend follow up with PCP for further evaluation   # Possible CAP - CT chest showed RLL opacity 2.4 x 1.1 cm c/f pneumonia vs atelectasis, LLL subsegmental atelectasis with scattered patchy GGO - Afebrile, leukocytosis up to 16.1, eventually resolved - Procalcitonin 0.7 - Treated with IV Rocephin  and PO azithromycin . Discharged on Augmentin  for 4 more days (to completed 5 days) and Azithromycin  for 1 day (to complete 3 days).   # Hypokalemia - resolved # Hypomagensemia - resolved - Follow up with PCP to recheck  # HTN - Home atenolol -clorthalidone combination pill discontinued to chlorthalidone  contributing to patient's presentation with acute hyponatremia - Continue atenolol  separately - Continue losartan    # HLD - Continue home Lipitor   # Hypothyroidism - Continue home Synthroid    # Polyarthralgia  Patient complaining of arthritic like pain involving different joints.  She was recently referred to see a rheumatologist by her PCP.  Uric acid  was tested by PCP and it was normal.  Right wrist imaging was done on admission for concern of some swelling and it was negative for any osseous abnormality. - Outpatient rheumatology evaluation - Supportive care   # Esophageal dysphagia # Possible reflux - Evaluated by SLP, noted to exhibit s/s of esophageal dysmotility with globus sensation - Continue PO protonix  40 mg daily - Referral to outpatient GI for evaluation of esophageal dysphagia    {Tip this will not be part of the note when signed Body mass index is 34.06 kg/m. , ,  (Optional):26781}   Consultants: *** Procedures performed: ***  Disposition: {Plan; Disposition:26390} Diet recommendation:  Diet Orders (From admission, onward)     Start     Ordered   01/31/24 1603  DIET DYS 3 Room service appropriate? Yes with Assist; Fluid consistency: Thin  Diet effective now       Comments: Meats MINCED w/ gravies added. More veggies and fruits and SOUPS at meals; less meats.   May have baked and sweet potatoes w/ butters per Speech ok.   Yogurt TID meals.  Question Answer Comment  Room service appropriate? Yes with Assist   Fluid consistency: Thin      01/31/24 1605            DISCHARGE MEDICATION: Allergies as of 02/02/2024   No Known Allergies   Med Rec must be completed prior to using this [facility]***        Durable Medical Equipment  (From admission, onward)           Start     Ordered   02/02/24 0000  For home use only DME 4 wheeled rolling walker with seat       Question Answer Comment  Patient needs a walker to treat with the following condition Generalized weakness   Patient needs a walker to treat with the following condition Polyarthralgia      02/02/24 1449            Follow-up Information     Wendee Lynwood HERO, NP. Schedule an appointment as soon as possible for a visit.   Specialties: Nurse Practitioner, Family Medicine Why: Follow up on CT chest findings of possible pulmonary  arterial hypertension Contact information: [address] [place] [address] [phone]         [facility]. Schedule an appointment as soon as possible for a visit in 2 week(s).   Why: For evaluation of esophageal dysphagia Contact information: [address] [place]  [address] [phone]                Discharge Exam: Fredricka Weights   01/29/24 1700  Weight: 79.1 kg   Blood pressure 122/78, pulse 79, temperature 98.6 F (37 C), temperature source Oral, resp. rate 20, height 5' (1.524 m), weight 79.1 kg, SpO2 99%.   ***  Condition at discharge: {DC Condition:26389}  The results of significant diagnostics from this hospitalization (including imaging, microbiology, ancillary and laboratory) are listed below for reference.   Imaging Studies: DG Shoulder Left Result Date: 01/31/2024 EXAM: 1 VIEW(S) XRAY OF THE LEFT SHOULDER 01/24/2024 03:30:00 PM COMPARISON: None available. CLINICAL HISTORY: pain pain pain FINDINGS: BONES AND JOINTS: Glenohumeral joint is normally aligned. No acute fracture. No malalignment. Mild acromioclavicular degenerative change. SOFT TISSUES: Surgical clips noted in the  left neck. No abnormal calcifications. Visualized lung is unremarkable. IMPRESSION: 1. No acute findings. 2. Mild acromioclavicular degenerative change. Electronically signed by: Oneil Devonshire MD 01/31/2024 11:56 PM EST RP Workstation: [id number]   CT CHEST W CONTRAST Result Date: 01/30/2024 CLINICAL DATA:  Right hilar opacity on same day chest radiograph EXAM: CT CHEST WITH CONTRAST TECHNIQUE: Multidetector CT imaging of the chest was performed during intravenous contrast administration. RADIATION DOSE REDUCTION: This exam was performed according to the departmental dose-optimization program which includes automated exposure control, adjustment of the mA and/or kV according to patient size and/or use of iterative reconstruction technique. CONTRAST:  75mL  OMNIPAQUE  IOHEXOL  300 MG/ML  SOLN COMPARISON:  Same day chest radiograph FINDINGS: Cardiovascular: Normal heart size. No significant pericardial fluid/thickening. Dilated main pulmonary artery measures 4.3 cm. Dilated and tortuous right pulmonary arteries and pulmonary veins. No central pulmonary emboli. Coronary artery calcifications and aortic atherosclerosis. Mediastinum/Nodes: Thyroidectomy. Small hiatal hernia. 12 mm right hilar lymph node (2:59). Lungs/Pleura: The central airways are patent. Peripheral posterior right lower lobe ovoid opacity measures 2.4 x 1.1 cm (3:65). Left lower lobe subsegmental atelectasis. Scattered patchy ground-glass opacities. No pneumothorax. No pleural effusion. Upper abdomen: Punctate calcification in the spleen, likely sequela of granulomatous infection. Musculoskeletal: Multilevel degenerative changes of the thoracic spine. Age indeterminate anterior wedging of T12. Mild superior endplate compression deformity of T1, T5. Old left rib fractures. IMPRESSION: 1. Peripheral posterior right lower lobe ovoid opacity measures 2.4 x 1.1 cm. This may represent an area of atelectasis or pneumonia. 2. Dilated main pulmonary artery measures 4.3 cm, which can be seen in the setting of pulmonary arterial hypertension. Queried right hilar density corresponds to dilated and tortuous right pulmonary arteries and pulmonary veins. No central pulmonary emboli. 3. A 12 mm right hilar lymph node, likely reactive. 4. Age indeterminate anterior wedging of T12. Mild superior endplate compression deformity of T1, T5. 5.  Aortic Atherosclerosis (ICD10-I70.0). Electronically Signed   By: Limin  Xu M.D.   On: 01/30/2024 15:43   DG Wrist Complete Right Result Date: 01/29/2024 CLINICAL DATA:  Wrist erythema EXAM: DG WRIST COMPLETE 3+V*R* COMPARISON:  12/09/2023 FINDINGS: No fracture or malalignment. Soft tissue edema without emphysema. Vascular calcifications IMPRESSION: No acute osseous abnormality.  Electronically Signed   By: Luke Bun M.D.   On: 01/29/2024 22:30   CT ABDOMEN PELVIS W CONTRAST Result Date: 01/29/2024 EXAM: CT ABDOMEN AND PELVIS WITH CONTRAST 01/29/2024 07:31:28 PM TECHNIQUE: CT of the abdomen and pelvis was performed with the administration of 100 mL of iohexol  (OMNIPAQUE ) 300 MG/ML solution. Multiplanar reformatted images are provided for review. Automated exposure control, iterative reconstruction, and/or weight-based adjustment of the mA/kV was utilized to reduce the radiation dose to as low as reasonably achievable. COMPARISON: None available. CLINICAL HISTORY: Abdominal pain, acute, nonlocalized. FINDINGS: LOWER CHEST: Lung bases demonstrate mild right lower lobe atelectatic changes. Some scarring in the left lung base is noted as well. LIVER: The liver is unremarkable. GALLBLADDER AND BILE DUCTS: Gallbladder is unremarkable. No biliary ductal dilatation. SPLEEN: No acute abnormality. PANCREAS: No acute abnormality. ADRENAL GLANDS: The adrenal glands are within normal limits. KIDNEYS, URETERS AND BLADDER: The left kidney has been surgically removed. The right kidney is mildly lobulated. No calculi or obstructive changes are noted in the right kidney. The bladder is well distended. GI AND BOWEL: Stomach and small bowel are unremarkable. No obstructive or inflammatory changes of the colon are seen. Mild diverticular changes noted without diverticulitis. The appendix is within normal limits. PERITONEUM AND  RETROPERITONEUM: No ascites. No free air. VASCULATURE: Aorta is normal in caliber. Aortic calcifications are noted without aneurysmal dilatation. LYMPH NODES: No lymphadenopathy. REPRODUCTIVE ORGANS: The uterus is within normal limits for the patient's age. No adnexal mass is seen. BONES AND SOFT TISSUES: Degenerative changes of the lumbar spine are seen. Chronic T12 compression deformity is noted. IMPRESSION: 1. No acute findings in the abdomen or pelvis. Electronically signed by:  Oneil Devonshire MD MD 01/29/2024 08:33 PM EST RP Workstation: [id number]   CT HEAD WO CONTRAST ( ) Result Date: 01/29/2024 EXAM: CT HEAD WITHOUT CONTRAST 01/29/2024 07:31:28 PM TECHNIQUE: CT of the head was performed without the administration of intravenous contrast. Automated exposure control, iterative reconstruction, and/or weight based adjustment of the mA/kV was utilized to reduce the radiation dose to as low as reasonably achievable. COMPARISON: None available. CLINICAL HISTORY: Headache, new onset (Age >= 51y). FINDINGS: BRAIN AND VENTRICLES: No acute hemorrhage. No evidence of acute infarct. Mild periventricular and deep white matter hypodensity typical of chronic small vessel ischemia. Basal gangliar mineralization, typically senescent. ORBITS: Bilateral lens resection. SINUSES: No acute abnormality. SOFT TISSUES AND SKULL: No acute soft tissue abnormality. No skull fracture. IMPRESSION: 1. Chronic ischemic changes without acute abnormality. Electronically signed by: Oneil Devonshire MD MD 01/29/2024 08:30 PM EST RP Workstation: [id number]   DG Chest 2 View Result Date: 01/29/2024 CLINICAL DATA:  Wheezing EXAM: CHEST - 2 VIEW COMPARISON:  09/09/2023 FINDINGS: Frontal and lateral views of the chest demonstrates stable enlargement of the cardiac silhouette. There is persistent right hilar prominence, which may reflect prominence of the central vasculature. However, given progression since 2024 underlying adenopathy or mass cannot be excluded. Further evaluation with contrasted CT chest may be useful. No acute airspace disease, effusion, or pneumothorax. No acute bony abnormalities. Chronic T12 compression deformity. IMPRESSION: 1. Increased density at the right hilum. While this could reflect prominence of the central vasculature, underlying adenopathy or mass cannot be excluded and further evaluation with nonemergent contrasted chest CT is recommended. 2. Stable enlarged cardiac silhouette. Electronically  Signed   By: Ozell Daring M.D.   On: 01/29/2024 18:38   US  Venous Img Lower Unilateral Right Result Date: 01/12/2024 CLINICAL DATA:  79 year old female with history of right posterior knee pain. EXAM: RIGHT LOWER EXTREMITY VENOUS DOPPLER ULTRASOUND TECHNIQUE: Gray-scale sonography with graded compression, as well as color Doppler and duplex ultrasound were performed to evaluate the right lower extremity deep venous systems from the level of the common femoral vein and including the common femoral, femoral, profunda femoral, popliteal and calf veins including the posterior tibial, peroneal and gastrocnemius veins when visible. Spectral Doppler was utilized to evaluate flow at rest and with distal augmentation maneuvers in the common femoral, femoral and popliteal veins. The contralateral common femoral vein was also evaluated for comparison. COMPARISON:  None Available. FINDINGS: RIGHT LOWER EXTREMITY Common Femoral Vein: No evidence of thrombus. Normal compressibility, respiratory phasicity and response to augmentation. Central Greater Saphenous Vein: No evidence of thrombus. Normal compressibility and flow on color Doppler imaging. Central Profunda Femoral Vein: No evidence of thrombus. Normal compressibility and flow on color Doppler imaging. Femoral Vein: No evidence of thrombus. Normal compressibility, respiratory phasicity and response to augmentation. Popliteal Vein: No evidence of thrombus. Normal compressibility, respiratory phasicity and response to augmentation. Calf Veins: No evidence of thrombus. Limited visualization of the peroneal vein. Normal compressibility and flow on color Doppler imaging. Other Findings: Ovoid anechoic, avascular structure in the medial popliteal fossa measuring approximately 3.6 x 0.9 x 2.7  cm. LEFT LOWER EXTREMITY Common Femoral Vein: No evidence of thrombus. Normal compressibility, respiratory phasicity and response to augmentation. IMPRESSION: 1. No evidence of right  lower extremity deep venous thrombosis. 2. Simple appearing right Baker cyst measuring up to 3.7 cm. Ester Sides, MD [facility] Electronically Signed   By: Ester Sides M.D.   On: 01/12/2024 14:04    Microbiology: Results for orders placed or performed during the hospital encounter of 01/29/24  Resp panel by RT-PCR (RSV, Flu A&B, Covid) Anterior Nasal Swab     Status: None   Collection Time: 01/30/24  7:59 PM   Specimen: Anterior Nasal Swab  Result Value Ref Range Status   SARS Coronavirus 2 by RT PCR NEGATIVE NEGATIVE Final    Comment: (NOTE) SARS-CoV-2 target nucleic acids are NOT DETECTED.  The SARS-CoV-2 RNA is generally detectable in upper respiratory specimens during the acute phase of infection. The lowest concentration of SARS-CoV-2 viral copies this assay can detect is 138 copies/mL. A negative result does not preclude SARS-Cov-2 infection and should not be used as the sole basis for treatment or other patient management decisions. A negative result may occur with  improper specimen collection/handling, submission of specimen other than nasopharyngeal swab, presence of viral mutation(s) within the areas targeted by this assay, and inadequate number of viral copies(<138 copies/mL). A negative result must be combined with clinical observations, patient history, and epidemiological information. The expected result is Negative.  Fact Sheet for Patients:  bloggercourse.com  Fact Sheet for Healthcare Providers:  seriousbroker.it  This test is no t yet approved or cleared by the [place]  FDA and  has been authorized for detection and/or diagnosis of SARS-CoV-2 by FDA under an Emergency Use Authorization (EUA). This EUA will remain  in effect (meaning this test can be used) for the duration of the COVID-19 declaration under Section 564(b)(1) of the Act,  21 U.S.C.section 360bbb-3(b)(1), unless the authorization is terminated  or revoked sooner.       Influenza A by PCR NEGATIVE NEGATIVE Final   Influenza B by PCR NEGATIVE NEGATIVE Final    Comment: (NOTE) The Xpert Xpress SARS-CoV-2/FLU/RSV plus assay is intended as an aid in the diagnosis of influenza from Nasopharyngeal swab specimens and should not be used as a sole basis for treatment. Nasal washings and aspirates are unacceptable for Xpert Xpress SARS-CoV-2/FLU/RSV testing.  Fact Sheet for Patients: bloggercourse.com  Fact Sheet for Healthcare Providers: seriousbroker.it  This test is not yet approved or cleared by the [place]  FDA and has been authorized for detection and/or diagnosis of SARS-CoV-2 by FDA under an Emergency Use Authorization (EUA). This EUA will remain in effect (meaning this test can be used) for the duration of the COVID-19 declaration under Section 564(b)(1) of the Act, 21 U.S.C. section 360bbb-3(b)(1), unless the authorization is terminated or revoked.     Resp Syncytial Virus by PCR NEGATIVE NEGATIVE Final    Comment: (NOTE) Fact Sheet for Patients: bloggercourse.com  Fact Sheet for Healthcare Providers: seriousbroker.it  This test is not yet approved or cleared by the [place]  FDA and has been authorized for detection and/or diagnosis of SARS-CoV-2 by FDA under an Emergency Use Authorization (EUA). This EUA will remain in effect (meaning this test can be used) for the duration of the COVID-19 declaration under Section 564(b)(1) of the Act, 21 U.S.C. section 360bbb-3(b)(1), unless the authorization is terminated or revoked.  Performed at [facility], [address], [place], [place] [address]  Labs: CBC: Recent Labs  Lab 01/29/24 1248 01/29/24 1702 01/30/24 0457 01/31/24 0331 02/01/24 0442 02/02/24 0506  WBC  14.5* 16.1* 14.0* 13.9* 10.2 7.9  NEUTROABS 12.8*  --   --   --   --   --   HGB 12.4 12.2 11.0* 11.1* 11.1* 12.1  HCT 35.2* 32.9* 30.1* 30.9* 31.9* 34.4*  MCV 88.6 84.1 85.3 86.8 88.6 89.1  PLT 252.0 237 207 243 259 309   Basic Metabolic Panel: Recent Labs  Lab 01/29/24 1702 01/29/24 2137 01/30/24 0050 01/30/24 0457 01/30/24 1023 01/30/24 1239 01/31/24 0331 01/31/24 0757 02/01/24 0442 02/01/24 1437 02/01/24 1859 02/01/24 2345 02/02/24 0339 02/02/24 0914 02/02/24 1058  NA 112*   < > 114* 114* 114*   < > 125*  124*   < > 129*   < > 131* 130* 130* 129* 130*  K 3.5  --   --  3.2*  --   --  3.6  --  3.5  --   --   --   --  3.7  --   CL 77*  --   --  80*  --   --  92*  --  96*  --   --   --   --  97*  --   CO2 24  --   --  24  --   --  24  --  23  --   --   --   --  21*  --   GLUCOSE 107*  --   --  94  --   --  109*  --  113*  --   --   --   --  120*  --   BUN 13  --   --  13  --   --  13  --  16  --   --   --   --  18  --   CREATININE 0.66  --   --  0.73  --   --  0.70  --  0.66  --   --   --   --  0.72  --   CALCIUM  9.2  --   --  8.3*  --   --  8.1*  --  7.9*  --   --   --   --  8.4*  --   MG  --   --  1.5*  --  1.6*  --  2.4  --   --   --   --   --   --   --   --   PHOS  --   --   --   --   --   --  2.9  --   --   --   --   --   --   --   --    < > = values in this interval not displayed.   Liver Function Tests: Recent Labs  Lab 01/29/24 1702 01/31/24 0331  AST 21  --   ALT 14  --   ALKPHOS 83  --   BILITOT 2.2*  --   PROT 7.1  --   ALBUMIN 3.5 2.9*   CBG: Recent Labs  Lab 02/01/24 0741 02/01/24 1205 02/02/24 0756  GLUCAP 105* 98 122*    Discharge time spent: Time Coordinating Discharge: I spent a total of 35 minutes engaged in face-to-face discussion with the patient and/or caregivers regarding the patients care, assessment, plan, and  discharge disposition. Over 50% of this time was dedicated to counseling the patient on the risks and benefits of treatment  options and the discharge plan, as well as coordinating post-discharge care.   Signed: Lavan Imes Al-Sultani, MD [facility] 02/02/2024         "

## 2024-02-02 NOTE — TOC Transition Note (Addendum)
 Transition of Care [facility]) - Discharge Note   Patient Details  Name: Rachel Quinn MRN: [id number] Date of Birth: October 30, 1945  [facility] CM/SW Contact:  Ridhaan Dreibelbis L Adja Ruff, LCSW Phone Number: 02/02/2024, 3:17 PM   Clinical Narrative:     CSW received a call from [place], daughter. She advised that she would like her mother to discharge home. [facility] services with [facility] ordered per daughter request. Darcia Finder ordered for delivery to the ED.   Attending notified and agreeable to services in the home.   TOC signing off.         Patient Goals and CMS Choice            Discharge Placement                       Discharge Plan and Services Additional resources added to the After Visit Summary for                                       Social Drivers of Health (SDOH) Interventions SDOH Screenings   Food Insecurity: No Food Insecurity (10/04/2023)  Housing: Low Risk (10/04/2023)  Transportation Needs: No Transportation Needs (10/04/2023)  Utilities: Not At Risk (10/04/2023)  Alcohol Screen: Low Risk (10/04/2023)  Depression (PHQ2-9): Low Risk (01/29/2024)  Financial Resource Strain: Low Risk (10/04/2023)  Physical Activity: Sufficiently Active (10/04/2023)  Social Connections: Socially Integrated (10/04/2023)  Stress: No Stress Concern Present (10/04/2023)  Tobacco Use: Low Risk (01/29/2024)  Health Literacy: Inadequate Health Literacy (10/04/2023)     Readmission Risk Interventions     No data to display

## 2024-02-02 NOTE — Progress Notes (Signed)
 " [facility]  ROUNDING NOTE   Subjective:   Patient seen resting in bed Daughter at bedside Awaiting breakfast Daughter complains of lack of mobility and weakness Also voices concerns of constipation  Sodium 130   Objective:  Vital signs in last 24 hours:  Temp:  [98.6 F (37 C)-99 F (37.2 C)] 98.6 F (37 C) (01/16 0757) Pulse Rate:  [69-85] 79 (01/16 0757) Resp:  [13-21] 20 (01/16 0757) BP: (122-146)/(63-81) 122/78 (01/16 0757) SpO2:  [97 %-100 %] 99 % (01/16 0757)  Weight change:  Filed Weights   01/29/24 1700  Weight: 79.1 kg    Intake/Output: I/O last 3 completed shifts: In: 296.6 [I.V.:196.6; IV Piggyback:100] Out: 2300 [Urine:2300]   Intake/Output this shift:  No intake/output data recorded.  Physical Exam: General: NAD  Head: Normocephalic, atraumatic. Moist oral mucosal membranes  Eyes: Anicteric  Lungs:  Clear to auscultation  Heart: Regular rate and rhythm  Abdomen:  Soft, nontender  Extremities:  Trace peripheral edema.  Neurologic: Awake, alert, conversant  Skin: Warm,dry, no rash       Basic Metabolic Panel: Recent Labs  Lab 01/29/24 1702 01/29/24 2137 01/30/24 0050 01/30/24 0457 01/30/24 1023 01/30/24 1239 01/31/24 0331 01/31/24 0757 02/01/24 0442 02/01/24 1437 02/01/24 1859 02/01/24 2345 02/02/24 0339 02/02/24 0914 02/02/24 1058  NA 112*   < > 114* 114* 114*   < > 125*  124*   < > 129*   < > 131* 130* 130* 129* 130*  K 3.5  --   --  3.2*  --   --  3.6  --  3.5  --   --   --   --  3.7  --   CL 77*  --   --  80*  --   --  92*  --  96*  --   --   --   --  97*  --   CO2 24  --   --  24  --   --  24  --  23  --   --   --   --  21*  --   GLUCOSE 107*  --   --  94  --   --  109*  --  113*  --   --   --   --  120*  --   BUN 13  --   --  13  --   --  13  --  16  --   --   --   --  18  --   CREATININE 0.66  --   --  0.73  --   --  0.70  --  0.66  --   --   --   --  0.72  --   CALCIUM  9.2  --   --  8.3*  --   --  8.1*  --   7.9*  --   --   --   --  8.4*  --   MG  --   --  1.5*  --  1.6*  --  2.4  --   --   --   --   --   --   --   --   PHOS  --   --   --   --   --   --  2.9  --   --   --   --   --   --   --   --    < > =  values in this interval not displayed.    Liver Function Tests: Recent Labs  Lab 01/29/24 1702 01/31/24 0331  AST 21  --   ALT 14  --   ALKPHOS 83  --   BILITOT 2.2*  --   PROT 7.1  --   ALBUMIN 3.5 2.9*   No results for input(s): LIPASE, AMYLASE in the last 168 hours. No results for input(s): AMMONIA in the last 168 hours.  CBC: Recent Labs  Lab 01/29/24 1248 01/29/24 1702 01/30/24 0457 01/31/24 0331 02/01/24 0442 02/02/24 0506  WBC 14.5* 16.1* 14.0* 13.9* 10.2 7.9  NEUTROABS 12.8*  --   --   --   --   --   HGB 12.4 12.2 11.0* 11.1* 11.1* 12.1  HCT 35.2* 32.9* 30.1* 30.9* 31.9* 34.4*  MCV 88.6 84.1 85.3 86.8 88.6 89.1  PLT 252.0 237 207 243 259 309    Cardiac Enzymes: Recent Labs  Lab 01/29/24 2137  CKTOTAL 88    BNP: Invalid input(s): POCBNP  CBG: Recent Labs  Lab 02/01/24 0741 02/01/24 1205 02/02/24 0756  GLUCAP 105* 98 122*    Microbiology: Results for orders placed or performed during the hospital encounter of 01/29/24  Resp panel by RT-PCR (RSV, Flu A&B, Covid) Anterior Nasal Swab     Status: None   Collection Time: 01/30/24  7:59 PM   Specimen: Anterior Nasal Swab  Result Value Ref Range Status   SARS Coronavirus 2 by RT PCR NEGATIVE NEGATIVE Final    Comment: (NOTE) SARS-CoV-2 target nucleic acids are NOT DETECTED.  The SARS-CoV-2 RNA is generally detectable in upper respiratory specimens during the acute phase of infection. The lowest concentration of SARS-CoV-2 viral copies this assay can detect is 138 copies/mL. A negative result does not preclude SARS-Cov-2 infection and should not be used as the sole basis for treatment or other patient management decisions. A negative result may occur with  improper specimen  collection/handling, submission of specimen other than nasopharyngeal swab, presence of viral mutation(s) within the areas targeted by this assay, and inadequate number of viral copies(<138 copies/mL). A negative result must be combined with clinical observations, patient history, and epidemiological information. The expected result is Negative.  Fact Sheet for Patients:  bloggercourse.com  Fact Sheet for Healthcare Providers:  seriousbroker.it  This test is no t yet approved or cleared by the [place]  FDA and  has been authorized for detection and/or diagnosis of SARS-CoV-2 by FDA under an Emergency Use Authorization (EUA). This EUA will remain  in effect (meaning this test can be used) for the duration of the COVID-19 declaration under Section 564(b)(1) of the Act, 21 U.S.C.section 360bbb-3(b)(1), unless the authorization is terminated  or revoked sooner.       Influenza A by PCR NEGATIVE NEGATIVE Final   Influenza B by PCR NEGATIVE NEGATIVE Final    Comment: (NOTE) The Xpert Xpress SARS-CoV-2/FLU/RSV plus assay is intended as an aid in the diagnosis of influenza from Nasopharyngeal swab specimens and should not be used as a sole basis for treatment. Nasal washings and aspirates are unacceptable for Xpert Xpress SARS-CoV-2/FLU/RSV testing.  Fact Sheet for Patients: bloggercourse.com  Fact Sheet for Healthcare Providers: seriousbroker.it  This test is not yet approved or cleared by the [place]  FDA and has been authorized for detection and/or diagnosis of SARS-CoV-2 by FDA under an Emergency Use Authorization (EUA). This EUA will remain in effect (meaning this test can be used) for the duration of the COVID-19 declaration under Section  564(b)(1) of the Act, 21 U.S.C. section 360bbb-3(b)(1), unless the authorization is terminated or revoked.     Resp Syncytial  Virus by PCR NEGATIVE NEGATIVE Final    Comment: (NOTE) Fact Sheet for Patients: bloggercourse.com  Fact Sheet for Healthcare Providers: seriousbroker.it  This test is not yet approved or cleared by the [place]  FDA and has been authorized for detection and/or diagnosis of SARS-CoV-2 by FDA under an Emergency Use Authorization (EUA). This EUA will remain in effect (meaning this test can be used) for the duration of the COVID-19 declaration under Section 564(b)(1) of the Act, 21 U.S.C. section 360bbb-3(b)(1), unless the authorization is terminated or revoked.  Performed at [facility], [address] [place], [place] [address]     Coagulation Studies: No results for input(s): LABPROT, INR in the last 72 hours.  Urinalysis: No results for input(s): COLORURINE, LABSPEC, PHURINE, GLUCOSEU, HGBUR, BILIRUBINUR, KETONESUR, PROTEINUR, UROBILINOGEN, NITRITE, LEUKOCYTESUR in the last 72 hours.  Invalid input(s): APPERANCEUR     Imaging: No results found.    Medications:    cefTRIAXone  (ROCEPHIN )  IV Stopped (02/02/24 0239)    atenolol   25 mg Oral Daily   atorvastatin   10 mg Oral Daily   azithromycin   500 mg Oral Daily   calcium  carbonate  1 tablet Oral BID WC   enoxaparin  (LOVENOX ) injection  40 mg Subcutaneous Q24H   feeding supplement  237 mL Oral BID BM   ipratropium-albuterol   3 mL Nebulization Q6H   levothyroxine   75 mcg Oral QAC breakfast   losartan   25 mg Oral Daily   pantoprazole   40 mg Oral Daily   polyethylene glycol  17 g Oral Daily   senna  1 tablet Oral Daily   sodium chloride  flush  3 mL Intravenous Q12H   acetaminophen  **OR** acetaminophen , morphine  injection, ondansetron  **OR** ondansetron  (ZOFRAN ) IV, phenol, senna-docusate  Assessment/ Plan:  Rachel Quinn is a 79 y.o.  female with past medical history including hypertension, hypothyroidism,  hyperlipidemia, who was admitted to [facility] on 01/29/2024 for Hyponatremia [E87.1]   Severe acute hyponatremia, serum sodium on ED arrival 112.  Offending agent can include chlorthalidone .  Patient received 500 mL liter normal saline bolus in emergency department followed by lactated Ringer's.  Little response in sodium level, 114.  CT chest shows possible pneumonia.  Sodium 130 today, acceptable for this patient.  Patient and family encouraged to maintain oral intake.  Also encourage for fluid restriction of 32 to 40 ounces daily.  Patient will follow-up with primary care physician.  Hypertension, essential.  Home regimen includes chlorthalidone .  Currently held due to hyponatremia.  Currently prescribed atenolol  and losartan .   Due to sodium improvements, we will sign off at this time.    LOS: 3 Tona Qualley 1/16/20262:40 PM   "
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
## 2024-02-02 NOTE — Progress Notes (Signed)
 Physical Therapy Treatment Patient Details Name: Rachel Quinn MRN: [id number] DOB: [birth year] Today's Date: 02/02/2024   History of Present Illness Rachel Quinn is a 78yoF who is sent to ED from home by PCP regarding Na+: 112. Pt has been dealing with myalgias, nausea, recently. Pt saw [facility] 01/24/23 who recommended consult with rheumatology. PMH: HTN, HLD, hypoTSH,  nephrectomy, recent Rt wrist pain.    PT Comments  Patient supine in bed on arrival. Patient completed bed mobility with minA. Stood from EOB with RW and minA to boost into standing. Ambulated 120' with RW and CGA. Daughter present throughout and reports feeling comfortable taking her home with other family members assisting. Notified MD of discharge recommendation change. Updated discharge recommendation to reflect current level of function.     If plan is discharge home, recommend the following: A little help with walking and/or transfers;A little help with bathing/dressing/bathroom;Assistance with cooking/housework;Assist for transportation;Help with stairs or ramp for entrance   Can travel by private vehicle        Equipment Recommendations  Rolling Requan Hardge (2 wheels)    Recommendations for Other Services       Precautions / Restrictions Precautions Precautions: Fall Recall of Precautions/Restrictions: Intact Restrictions Weight Bearing Restrictions Per Provider Order: No     Mobility  Bed Mobility Overal bed mobility: Needs Assistance Bed Mobility: Supine to Sit, Sit to Supine     Supine to sit: Min assist Sit to supine: Min assist        Transfers Overall transfer level: Needs assistance Equipment used: Rolling Gurpreet Mikhail (2 wheels) Transfers: Sit to/from Stand Sit to Stand: Min assist                Ambulation/Gait Ambulation/Gait assistance: Contact guard assist Gait Distance (Feet): 120 Feet Assistive device: Rolling Annessa Satre (2 wheels) Gait Pattern/deviations: Step-through  pattern, Decreased stride length Gait velocity: decreased     General Gait Details: CGA for safety   Stairs             Wheelchair Mobility     Tilt Bed    Modified Rankin (Stroke Patients Only)       Balance Overall balance assessment: Needs assistance Sitting-balance support: No upper extremity supported, Feet supported Sitting balance-Leahy Scale: Good     Standing balance support: Bilateral upper extremity supported, Reliant on assistive device for balance Standing balance-Leahy Scale: Fair                              Hotel Manager: No apparent difficulties  Cognition Arousal: Alert Behavior During Therapy: WFL for tasks assessed/performed   PT - Cognitive impairments: No apparent impairments                         Following commands: Intact      Cueing Cueing Techniques: Verbal cues  Exercises      General Comments        Pertinent Vitals/Pain Pain Assessment Pain Assessment: Faces Faces Pain Scale: No hurt Pain Intervention(s): Monitored during session    Home Living                          Prior Function            PT Goals (current goals can now be found in the care plan section) Acute Rehab PT Goals PT Goal Formulation: With patient Time For  Goal Achievement: 02/14/24 Potential to Achieve Goals: Good Progress towards PT goals: Progressing toward goals    Frequency    Min 2X/week      PT Plan      Co-evaluation              AM-PAC PT 6 Clicks Mobility   Outcome Measure  Help needed turning from your back to your side while in a flat bed without using bedrails?: A Little Help needed moving from lying on your back to sitting on the side of a flat bed without using bedrails?: A Little Help needed moving to and from a bed to a chair (including a wheelchair)?: A Little Help needed standing up from a chair using your arms (e.g., wheelchair or bedside  chair)?: A Little Help needed to walk in hospital room?: A Little Help needed climbing 3-5 steps with a railing? : A Little 6 Click Score: 18    End of Session   Activity Tolerance: Patient tolerated treatment well;No increased pain Patient left: in bed;with call bell/phone within reach;with family/visitor present Nurse Communication: Mobility status PT Visit Diagnosis: Unsteadiness on feet (R26.81);Other abnormalities of gait and mobility (R26.89);Muscle weakness (generalized) (M62.81)     Time: 8593-8560 PT Time Calculation (min) (ACUTE ONLY): 33 min  Charges:    $Therapeutic Activity: 8-22 mins PT General Charges $$ ACUTE PT VISIT: 1 Visit                     Maryanne Finder, PT, DPT Physical Therapist - [facility]  [facility]    Jeffey Janssen A Pamela Maddy 02/02/2024, 3:32 PM

## 2024-02-02 NOTE — Progress Notes (Signed)
 Occupational Therapy Treatment Patient Details Name: Rachel Quinn MRN: [id number] DOB: 26-Apr-1945 Today's Date: 02/02/2024   History of present illness Rachel Quinn is a 78yoF who is sent to ED from home by PCP regarding Na+: 112. Pt has been dealing with myalgias, nausea, recently. Pt saw [facility] 01/24/23 who recommended consult with rheumatology. PMH: HTN, HLD, hypoTSH,  nephrectomy, recent Rt wrist pain.   OT comments  Pt received in bed with supportive daughter present. Pt reports pain much improved. Progressing towards goals, pt discharge recommendations updated to reflect functional gains. Pt requires MIN A for bed mobility and SPC t/f BSC, able to ambulate in hallway with RW + CGA ~100+ ft . Daughter and pt both in agreement in returning home with [facility] services. Discharge recommendation updated.       If plan is discharge home, recommend the following:  A little help with walking and/or transfers;A little help with bathing/dressing/bathroom;Assist for transportation   Equipment Recommendations   (RW)       Precautions / Restrictions Precautions Precautions: Fall Recall of Precautions/Restrictions: Intact Restrictions Weight Bearing Restrictions Per Provider Order: No       Mobility Bed Mobility Overal bed mobility: Needs Assistance Bed Mobility: Supine to Sit, Sit to Supine     Supine to sit: Min assist Sit to supine: Min assist        Transfers Overall transfer level: Needs assistance Equipment used: Rolling walker (2 wheels) Transfers: Sit to/from Stand Sit to Stand: Min assist                 Balance Overall balance assessment: Needs assistance Sitting-balance support: No upper extremity supported, Feet supported Sitting balance-Leahy Scale: Good     Standing balance support: Bilateral upper extremity supported, Reliant on assistive device for balance Standing balance-Leahy Scale: Fair                             ADL  either performed or assessed with clinical judgement   ADL Overall ADL's : Needs assistance/impaired                         Toilet Transfer: Cueing for safety;Cueing for sequencing;BSC/3in1;Stand-pivot Toilet Transfer Details (indicate cue type and reason): pt verbalizing need to use BSC for BM; SPT with SPC to [facility] HHA MIN A Toileting- Clothing Manipulation and Hygiene: Sit to/from stand;Minimal assistance Toileting - Clothing Manipulation Details (indicate cue type and reason): MAX A to doff brief in standing, MAX A for pericare       General ADL Comments: pt improved with mobility and ADL performance this session, daughter in agreement to return home vs SNF     Communication Communication Communication: No apparent difficulties   Cognition Arousal: Alert Behavior During Therapy: WFL for tasks assessed/performed                                 Following commands: Intact        Cueing   Cueing Techniques: Verbal cues             Pertinent Vitals/ Pain       Pain Assessment Pain Assessment: Faces Faces Pain Scale: No hurt Pain Intervention(s): Monitored during session   Frequency  Min 2X/week        Progress Toward Goals  OT Goals(current goals can now be found in the  care plan section)  Progress towards OT goals: Progressing toward goals  Acute Rehab OT Goals OT Goal Formulation: With patient Time For Goal Achievement: 02/14/24 Potential to Achieve Goals: Fair ADL Goals Pt Will Perform Grooming: with supervision;with set-up;sitting Pt Will Perform Lower Body Dressing: with min assist;sit to/from stand;sitting/lateral leans Pt Will Transfer to Toilet: with min assist;bedside commode Pt Will Perform Toileting - Clothing Manipulation and hygiene: with min assist;with caregiver independent in assisting  Plan      AM-PAC OT 6 Clicks Daily Activity     Outcome Measure   Help from another person eating meals?: None Help from  another person taking care of personal grooming?: None Help from another person toileting, which includes using toliet, bedpan, or urinal?: A Little Help from another person bathing (including washing, rinsing, drying)?: A Little Help from another person to put on and taking off regular upper body clothing?: None Help from another person to put on and taking off regular lower body clothing?: A Little 6 Click Score: 21    End of Session Equipment Utilized During Treatment: Gait belt;Rolling walker (2 wheels);Other (comment) (personal SPC)  OT Visit Diagnosis: Unsteadiness on feet (R26.81);Muscle weakness (generalized) (M62.81);Other abnormalities of gait and mobility (R26.89);Pain   Activity Tolerance Patient tolerated treatment well   Patient Left in bed;with call bell/phone within reach;with bed alarm set;with family/visitor present   Nurse Communication Mobility status        Time: 8592-8560 OT Time Calculation (min): 32 min  Charges: OT General Charges $OT Visit: 1 Visit OT Treatments $Self Care/Home Management : 8-22 mins  Saje Gallop L. Ka Bench, OTR/L  02/02/24, 3:51 PM

## 2024-02-05 ENCOUNTER — Telehealth: Payer: Self-pay

## 2024-02-05 ENCOUNTER — Ambulatory Visit: Payer: Self-pay

## 2024-02-05 NOTE — Telephone Encounter (Signed)
Pt is seeing PCP tomorrow

## 2024-02-05 NOTE — Telephone Encounter (Signed)
 Not sure how much I can help with the joint pain - the rheum referral is in    Please make sure they get an appointment scheduled with rheum  Of course -happy to see her if needed   Will need hosp follow up with pcp optimally as well   Will cc to pcp

## 2024-02-05 NOTE — Telephone Encounter (Signed)
 FYI Only or Action Required?: FYI only for provider: appointment scheduled on 02/06/24.  Patient was last seen in primary care on 01/29/2024 by Randeen Laine LABOR, MD.  Called Nurse Triage reporting Hand Pain.  Symptoms began several weeks ago.  Interventions attempted: Other: Hospitalized.  Symptoms are: unchanged.  Triage Disposition: See PCP Within 2 Weeks  Patient/caregiver understands and will follow disposition?: Yes  Reason for Disposition  Requesting regular office appointment  Answer Assessment - Initial Assessment Questions Pt hospitalized for hyponatremia along with hand pain and swelling. Discharged 02/02/24. [name] given rheumatology office number as they have not reached out to schedule patient.   1. REASON FOR CALL: What is the main reason for your call? or How can I best help you?     [name] Ulla calling in today to set up hospital f/u.   2. SYMPTOMS : Do you have any symptoms?      Bilateral hand pain / swelling, denies new or worsening   3. OTHER QUESTIONS: Do you have any other questions?     Rheumatology referral status  Protocols used: Information Only Call - No Triage-A-AH  Message from Avram MATSU sent at 02/05/2024  8:37 AM EST  Reason for Triage: patient would like a hospital follow up for swelling/pain in hands

## 2024-02-05 NOTE — Transitions of Care (Post Inpatient/ED Visit) (Signed)
" ° °  02/05/2024  Name: Rachel Quinn MRN: [id number] DOB: 11-23-1945  Today's TOC FU Call Status: Today's TOC FU Call Status:: Unsuccessful Call (1st Attempt) Unsuccessful Call (1st Attempt) Date: 02/05/24  Attempted to reach the patient regarding the most recent Inpatient/ED visit.  Follow Up Plan: Additional outreach attempts will be made to reach the patient to complete the Transitions of Care (Post Inpatient/ED visit) call.   Arvin Seip RN, BSN, CCM [employer], Population Health Case Manager Phone: [phone]  "

## 2024-02-05 NOTE — Telephone Encounter (Signed)
 Hospital f/u and joint pain have been scheduled with Dr. Randeen tomorrow. Checking with PCP to see if he has any availability to see her, will await his response

## 2024-02-06 ENCOUNTER — Inpatient Hospital Stay: Admitting: Family Medicine

## 2024-02-06 ENCOUNTER — Ambulatory Visit: Admitting: Nurse Practitioner

## 2024-02-06 ENCOUNTER — Ambulatory Visit (INDEPENDENT_AMBULATORY_CARE_PROVIDER_SITE_OTHER)
Admission: RE | Admit: 2024-02-06 | Discharge: 2024-02-06 | Disposition: A | Discharge status: Home / Self Care | Source: Ambulatory Visit | Attending: Nurse Practitioner | Admitting: Nurse Practitioner

## 2024-02-06 VITALS — BP 138/70 | HR 73 | Temp 98.4°F | Ht 60.0 in | Wt 174.0 lb

## 2024-02-06 DIAGNOSIS — I10 Essential (primary) hypertension: Secondary | ICD-10-CM | POA: Diagnosis not present

## 2024-02-06 DIAGNOSIS — Z8701 Personal history of pneumonia (recurrent): Secondary | ICD-10-CM

## 2024-02-06 DIAGNOSIS — E871 Hypo-osmolality and hyponatremia: Secondary | ICD-10-CM | POA: Diagnosis not present

## 2024-02-06 DIAGNOSIS — R600 Localized edema: Secondary | ICD-10-CM | POA: Diagnosis not present

## 2024-02-06 DIAGNOSIS — M2550 Pain in unspecified joint: Secondary | ICD-10-CM | POA: Diagnosis not present

## 2024-02-06 DIAGNOSIS — E039 Hypothyroidism, unspecified: Secondary | ICD-10-CM

## 2024-02-06 DIAGNOSIS — Z09 Encounter for follow-up examination after completed treatment for conditions other than malignant neoplasm: Secondary | ICD-10-CM

## 2024-02-06 LAB — COMPREHENSIVE METABOLIC PANEL WITH GFR
ALT: 36 U/L — ABNORMAL HIGH (ref 3–35)
AST: 24 U/L (ref 5–37)
Albumin: 3.2 g/dL — ABNORMAL LOW (ref 3.5–5.2)
Alkaline Phosphatase: 119 U/L — ABNORMAL HIGH (ref 39–117)
BUN: 14 mg/dL (ref 6–23)
CO2: 24 meq/L (ref 19–32)
Calcium: 7.6 mg/dL — ABNORMAL LOW (ref 8.4–10.5)
Chloride: 95 meq/L — ABNORMAL LOW (ref 96–112)
Creatinine, Ser: 0.6 mg/dL (ref 0.40–1.20)
GFR: 86.74 mL/min
Glucose, Bld: 115 mg/dL — ABNORMAL HIGH (ref 70–99)
Potassium: 4.3 meq/L (ref 3.5–5.1)
Sodium: 124 meq/L — ABNORMAL LOW (ref 135–145)
Total Bilirubin: 0.9 mg/dL (ref 0.2–1.2)
Total Protein: 7 g/dL (ref 6.0–8.3)

## 2024-02-06 LAB — CBC
HCT: 34.7 % — ABNORMAL LOW (ref 36.0–46.0)
Hemoglobin: 11.9 g/dL — ABNORMAL LOW (ref 12.0–15.0)
MCHC: 34.3 g/dL (ref 30.0–36.0)
MCV: 90.2 fl (ref 78.0–100.0)
Platelets: 303 K/uL (ref 150.0–400.0)
RBC: 3.84 Mil/uL — ABNORMAL LOW (ref 3.87–5.11)
RDW: 13.8 % (ref 11.5–15.5)
WBC: 11 K/uL — ABNORMAL HIGH (ref 4.0–10.5)

## 2024-02-06 LAB — MAGNESIUM: Magnesium: 1.8 mg/dL (ref 1.5–2.5)

## 2024-02-06 LAB — BRAIN NATRIURETIC PEPTIDE: Pro B Natriuretic peptide (BNP): 89 pg/mL (ref 1.0–100.0)

## 2024-02-06 MED ORDER — TRAMADOL HCL 50 MG PO TABS: 50.0000 mg | ORAL_TABLET | Freq: Three times a day (TID) | ORAL | 0 refills | Status: AC | PRN

## 2024-02-06 NOTE — Patient Instructions (Signed)
 Nice to see you today If you do not hear from home health this week let me know  I will be in touch with the labs and xray once I have them  Follow up with me in approx 10 days

## 2024-02-06 NOTE — Progress Notes (Signed)
 "  Established Patient Office Visit  Subjective   Patient ID: Rachel Quinn, female    DOB: 16-Dec-1945  Age: 79 y.o. MRN: [id number]  Chief Complaint  Patient presents with   Hospitalization Follow-up    Pt complains of still having issues with swelling in both feet.    Medication Management    Pt would like to know if medication given in ER needs to be continued. Amoxicillin , atenolol , azithromycin , losartan , pantoprazole , polyethylene glycol, senna.    Labs Only    Recheck sodium levels.     HPI  Patient was seen by colleague on 01/29/2024 for multiple joint pain wheezing and muscle tightness.  Patient was instructed to hold off statin for 2 weeks to see if this would help labs were ordered..  Patient was referred to rheumatology from orthopedist for likely inflammatory arthritis.  Once blood work was back patient had a critical sodium of 112 and patient was directed to the local emergency department.  They did do CT imaging.  CT of the head abdomen and pelvis came back normal.  CT of the chest showed posterior right lower lobe opacity either atelectasis or pneumonia dilated main pulmonary artery 12 mm right hilar lymph node likely reactive age indeterminant anterior wedging of T12 and aortic atherosclerosis.  Patient was discharged on 02/02/2024 needs repeat CBC CMP magnesium  and two-view chest x-ray patient is on fluid restriction.  They discontinued chlorthalidone  and initiated losartan .  30 to 40 ounces of fluid restriction.  Patient was treated with IV Rocephin  and azithromycin  and then discharged on Augmentin  for 4 days and azithromycin  for possible community-acquired pneumonia.  Patient to continue atenolol  50 mg daily and started on losartan  25 mg daily.  Patient was referred to outpatient GI for evaluation of esophageal dysphagia and Protonix  40 mg daily was continued.  Patient was recommended by PT OT and they recommended home health  Discussed the use of AI scribe software for  clinical note transcription with the patient, who gave verbal consent to proceed.  History of Present Illness Rachel Quinn is a 79 year old female who presents for follow-up after hospitalization for hyponatremia. She is accompanied by her sister.  She was hospitalized for a significantly low sodium level of 112 mEq/L, discovered during a visit. During her hospital stay, she underwent imaging studies of the head and abdomen, and nephrology was consulted. Her blood pressure medication was changed from chlorthalidone  to losartan  and atenolol  due to concerns about her sodium levels. She was treated for pneumonia with Augmentin  and azithromycin  and discharged with these medications. She is currently on a fluid restriction of 40 ounces per day to manage her sodium levels.  She experiences ongoing pain, swelling, and redness in her joints, persisting since before her hospitalization. She is scheduled to see a rheumatologist in March. She uses tramadol  for pain relief, taking it up to twice a day, and reports needing a refill. She also uses Tylenol  as needed for pain.  She has experienced some swelling in her legs. She keeps her legs elevated when sitting to manage the swelling.  She reports soft stools but no diarrhea and has stopped using stool softeners. No fever, chills, shortness of breath, chest pain, or productive cough. She experiences headaches, which she attributes to her joint pain.  She is currently taking atenolol  50 mg, losartan , Protonix , and has completed a course of antibiotics for pneumonia. She is not taking potassium supplements at this time, but her potassium levels will be checked.  Review of Systems  Constitutional:  Positive for malaise/fatigue. Negative for chills and fever.  Respiratory:  Positive for cough. Negative for sputum production and shortness of breath.   Cardiovascular:  Negative for chest pain.  Gastrointestinal:  Negative for abdominal pain and  diarrhea.  Musculoskeletal:  Positive for joint pain.  Neurological:  Positive for headaches. Negative for dizziness.      Objective:     BP 138/70   Pulse 73   Temp 98.4 F (36.9 C) (Oral)   Ht 5' (1.524 m)   Wt 174 lb (78.9 kg)   SpO2 98%   BMI 33.98 kg/m    Physical Exam Vitals and nursing note reviewed.  Constitutional:      Appearance: Normal appearance.  Cardiovascular:     Rate and Rhythm: Normal rate and regular rhythm.     Heart sounds: Normal heart sounds.  Pulmonary:     Breath sounds: Normal breath sounds.     Comments: Seemed labored Abdominal:     General: Bowel sounds are normal.  Musculoskeletal:     Right lower leg: No edema.     Left lower leg: No edema.  Neurological:     Mental Status: She is alert.      No results found for any visits on 02/06/24.    The 10-year ASCVD risk score (Arnett DK, et al., 2019) is: 29.1%    Assessment & Plan:   Problem List Items Addressed This Visit       Cardiovascular and Mediastinum   Essential hypertension   Relevant Orders   CBC   Comprehensive metabolic panel with GFR   Magnesium      Other   Hospital discharge follow-up - Primary   Relevant Orders   CBC   Comprehensive metabolic panel with GFR   Magnesium    Other Visit Diagnoses       Polyarthralgia       Relevant Medications   traMADol  (ULTRAM ) 50 MG tablet     Lower extremity edema       Relevant Orders   Brain natriuretic peptide     History of pneumonia       Relevant Orders   DG Chest 2 View      Assessment and Plan Assessment & Plan Rheumatoid arthritis Suspected due to joint symptoms. Awaiting rheumatology consultation and DMARD therapy.  Patient had positive RF lab - Continue tramadol  as needed, up to three times a day. - Use Tylenol  for additional pain relief if needed. - Attend rheumatology appointment in March.  Hyponatremia Severe hyponatremia with sodium at 112 mEq/L. Hospitalized for correction. Chlorthalidone   discontinued. Fluid restriction implemented. - Checked sodium and other electrolytes today. - Maintain fluid intake restriction to 40 ounces per day. - Monitor for symptoms of hyponatremia.  Pneumonia, recent Recent pneumonia treated with antibiotics. Chest x-ray planned. - Ordered chest x-ray to assess lung status. - Continue azithromycin  and Augmentin  as prescribed.  Essential hypertension Blood pressure controlled on losartan  and atenolol . Chlorthalidone  discontinued. - Continue losartan  and atenolol  as prescribed. - Monitor blood pressure regularly.  Hypothyroidism, postsurgical - Continue current management with levothyroxine .  Return in about 10 days (around 02/16/2024) for Hyponatremia/hypokalemia/Breathing .    Adina Crandall, NP  "

## 2024-02-07 ENCOUNTER — Telehealth: Payer: Self-pay

## 2024-02-07 NOTE — Transitions of Care (Post Inpatient/ED Visit) (Signed)
" ° °  02/07/2024  Name: Rachel Quinn MRN: [id number] DOB: [birth year]  Today's TOC FU Call Status: Today's TOC FU Call Status:: Unsuccessful Call (2nd Attempt) Unsuccessful Call (2nd Attempt) Date: 02/07/24  Attempted to reach the patient regarding the most recent Inpatient/ED visit.  Follow Up Plan: Additional outreach attempts will be made to reach the patient to complete the Transitions of Care (Post Inpatient/ED visit) call.   Arvin Seip RN, BSN, CCM [employer], Population Health Case Manager Phone: [phone]  "

## 2024-02-08 ENCOUNTER — Ambulatory Visit: Payer: Self-pay | Admitting: Nurse Practitioner

## 2024-02-08 ENCOUNTER — Other Ambulatory Visit (INDEPENDENT_AMBULATORY_CARE_PROVIDER_SITE_OTHER)

## 2024-02-08 DIAGNOSIS — E871 Hypo-osmolality and hyponatremia: Secondary | ICD-10-CM | POA: Diagnosis not present

## 2024-02-08 NOTE — Telephone Encounter (Signed)
 Called daughter let her know results we have set up for lab today. She will bring her in.  No further action needed at this time.

## 2024-02-09 LAB — BASIC METABOLIC PANEL WITH GFR
BUN: 12 mg/dL (ref 6–23)
CO2: 26 meq/L (ref 19–32)
Calcium: 7.6 mg/dL — ABNORMAL LOW (ref 8.4–10.5)
Chloride: 100 meq/L (ref 96–112)
Creatinine, Ser: 0.67 mg/dL (ref 0.40–1.20)
GFR: 84.45 mL/min
Glucose, Bld: 88 mg/dL (ref 70–99)
Potassium: 4.6 meq/L (ref 3.5–5.1)
Sodium: 132 meq/L — ABNORMAL LOW (ref 135–145)

## 2024-02-09 NOTE — Telephone Encounter (Signed)
 Copied from CRM #[id number]. Topic: Clinical - Medication Question >> Feb 09, 2024  8:22 AM Ahlexyia S wrote: Reason for CRM: Pt daughter Ulla called in to confirm if pt will have any medication prescribed to her. Pt was advised to have some non-fasting labs completed, labs were done yesterday but the results have not came back yet. Ulla is just wanting to confirm so she can pick them up before the storm. I advised Ulla that since the results have not came back yet the nurse may be unable to confirm this info but Ulla would like a callback regarding this to confirm.

## 2024-02-11 ENCOUNTER — Ambulatory Visit: Payer: Self-pay | Admitting: Nurse Practitioner

## 2024-02-13 ENCOUNTER — Emergency Department
Admission: EM | Admit: 2024-02-13 | Discharge: 2024-02-14 | Disposition: A | Discharge status: Home / Self Care | Attending: Emergency Medicine | Admitting: Emergency Medicine

## 2024-02-13 ENCOUNTER — Emergency Department

## 2024-02-13 ENCOUNTER — Other Ambulatory Visit: Payer: Self-pay

## 2024-02-13 DIAGNOSIS — I129 Hypertensive chronic kidney disease with stage 1 through stage 4 chronic kidney disease, or unspecified chronic kidney disease: Secondary | ICD-10-CM | POA: Insufficient documentation

## 2024-02-13 DIAGNOSIS — D72829 Elevated white blood cell count, unspecified: Secondary | ICD-10-CM | POA: Diagnosis not present

## 2024-02-13 DIAGNOSIS — M25562 Pain in left knee: Secondary | ICD-10-CM | POA: Insufficient documentation

## 2024-02-13 DIAGNOSIS — N189 Chronic kidney disease, unspecified: Secondary | ICD-10-CM | POA: Diagnosis not present

## 2024-02-13 LAB — CBC WITH DIFFERENTIAL/PLATELET
Abs Immature Granulocytes: 0.05 10*3/uL (ref 0.00–0.07)
Basophils Absolute: 0.1 10*3/uL (ref 0.0–0.1)
Basophils Relative: 1 %
Eosinophils Absolute: 0.2 10*3/uL (ref 0.0–0.5)
Eosinophils Relative: 1 %
HCT: 33.2 % — ABNORMAL LOW (ref 36.0–46.0)
Hemoglobin: 11 g/dL — ABNORMAL LOW (ref 12.0–15.0)
Immature Granulocytes: 1 %
Lymphocytes Relative: 13 %
Lymphs Abs: 1.4 10*3/uL (ref 0.7–4.0)
MCH: 30.3 pg (ref 26.0–34.0)
MCHC: 33.1 g/dL (ref 30.0–36.0)
MCV: 91.5 fL (ref 80.0–100.0)
Monocytes Absolute: 0.5 10*3/uL (ref 0.1–1.0)
Monocytes Relative: 5 %
Neutro Abs: 8.6 10*3/uL — ABNORMAL HIGH (ref 1.7–7.7)
Neutrophils Relative %: 79 %
Platelets: 373 10*3/uL (ref 150–400)
RBC: 3.63 MIL/uL — ABNORMAL LOW (ref 3.87–5.11)
RDW: 13.2 % (ref 11.5–15.5)
WBC: 10.7 10*3/uL — ABNORMAL HIGH (ref 4.0–10.5)
nRBC: 0 % (ref 0.0–0.2)

## 2024-02-13 LAB — BASIC METABOLIC PANEL WITH GFR
Anion gap: 12 (ref 5–15)
BUN: 14 mg/dL (ref 8–23)
CO2: 21 mmol/L — ABNORMAL LOW (ref 22–32)
Calcium: 8.3 mg/dL — ABNORMAL LOW (ref 8.9–10.3)
Chloride: 98 mmol/L (ref 98–111)
Creatinine, Ser: 0.65 mg/dL (ref 0.44–1.00)
GFR, Estimated: >60 mL/min
Glucose, Bld: 104 mg/dL — ABNORMAL HIGH (ref 70–99)
Potassium: 4.1 mmol/L (ref 3.5–5.1)
Sodium: 130 mmol/L — ABNORMAL LOW (ref 135–145)

## 2024-02-13 NOTE — ED Triage Notes (Signed)
 Pt c/o left knee pain that began yesterday, pt reports 2-3 months ago she had surgery to flush out infection from knee.

## 2024-02-14 LAB — SYNOVIAL CELL COUNT + DIFF, W/ CRYSTALS
Crystals, Fluid: NONE SEEN
Eosinophils-Synovial: 0 % (ref 0–1)
Lymphocytes-Synovial Fld: 4 % (ref 0–20)
Monocyte-Macrophage-Synovial Fluid: 3 % — ABNORMAL LOW (ref 50–90)
Neutrophil, Synovial: 93 % — ABNORMAL HIGH (ref 0–25)
WBC, Synovial: 8911 /cu mm — ABNORMAL HIGH (ref 0–200)

## 2024-02-14 MED ORDER — OXYCODONE-ACETAMINOPHEN 5-325 MG PO TABS
1.0000 | ORAL_TABLET | Freq: Once | ORAL | Status: AC
  Administered 2024-02-14: 1 via ORAL
  Filled 2024-02-14: qty 1

## 2024-02-14 MED ORDER — LIDOCAINE-EPINEPHRINE (PF) 2 %-1:200000 IJ SOLN
5.0000 mL | INTRAMUSCULAR | Status: DC
  Administered 2024-02-14: 5 mL

## 2024-02-14 MED ORDER — LIDOCAINE-EPINEPHRINE 2 %-1:100000 IJ SOLN
20.0000 mL | Freq: Once | INTRAMUSCULAR | Status: AC
  Administered 2024-02-14: 20 mL
  Filled 2024-02-14: qty 1

## 2024-02-14 NOTE — Discharge Instructions (Signed)
 Your lab tests today are reassuring.  The joint fluid analysis does not indicate an infection.  Please follow up with orthopedics for further evaluation.

## 2024-02-14 NOTE — ED Provider Notes (Addendum)
 "  [facility] Provider Note    Event Date/Time   First MD Initiated Contact with Patient 02/14/24 0103     (approximate)   History   Chief Complaint: Knee Pain   HPI  Rachel Quinn is a 79 y.o. female with history of CKD, hypertension, prior left knee arthroscopy for infected joint in August 2025 who comes ED complaining of increased pain and swelling in the left knee.  No trauma, no fever chest pain shortness of breath or leg swelling.        Past Medical History:  Diagnosis Date   Cataract    Chronic kidney disease    Heart murmur    Hypertension    Kidney stone    Thyroid  disease     Current Outpatient Rx   Order #: [id number] Class: Historical Med   Order #: [id number] Class: Normal   Order #: [id number] Class: Normal   Order #: [id number] Class: Normal   Order #: [id number] Class: Historical Med   Order #: [id number] Class: No Print   Order #: [id number] Class: Normal   Order #: [id number] Class: Normal   Order #: [id number] Class: Normal   Order #: [id number] Class: Normal   Order #: [id number] Class: Normal    Past Surgical History:  Procedure Laterality Date   CATARACT EXTRACTION Bilateral 01/2020   KIDNEY SURGERY  2000   KNEE ARTHROSCOPY Left 09/10/2023   Procedure: ARTHROSCOPY, KNEE;  Surgeon: Lorelle Hussar, MD;  Location: [facility] ORS;  Service: Orthopedics;  Laterality: Left;   NEPHRECTOMY Left 01/18/1999   2/2 nephrolithiasis   THYROIDECTOMY  01/17/2002   TUBAL LIGATION      Physical Exam   Triage Vital Signs: ED Triage Vitals  Encounter Vitals Group     BP 02/13/24 2318 (!) 142/81     Girls Systolic BP Percentile --      Girls Diastolic BP Percentile --      Boys Systolic BP Percentile --      Boys Diastolic BP Percentile --      Pulse Rate 02/13/24 2318 74     Resp 02/13/24 2318 17     Temp 02/13/24 2318 98.5 F (36.9 C)     Temp src --      SpO2 02/13/24 2318 99 %     Weight 02/13/24 2317 174 lb (78.9 kg)     Height  02/13/24 2317 5' (1.524 m)     Head Circumference --      Peak Flow --      Pain Score 02/13/24 2317 10     Pain Loc --      Pain Education --      Exclude from Growth Chart --     Most recent vital signs: Vitals:   02/13/24 2318 02/14/24 0545  BP: (!) 142/81 (!) 147/73  Pulse: 74 75  Resp: 17 16  Temp: 98.5 F (36.9 C) 97.9 F (36.6 C)  SpO2: 99% 100%    General: Awake, no distress.  CV:  Good peripheral perfusion.  Normal distal pulses Resp:  Normal effort.  Abd:  No distention.  Other:  Mild effusion of the left knee.  No inflammatory soft tissue changes.  Pain with range of motion.   ED Results / Procedures / Treatments   Labs (all labs ordered are listed, but only abnormal results are displayed) Labs Reviewed  CBC WITH DIFFERENTIAL/PLATELET - Abnormal; Notable for the following components:      Result Value   WBC 10.7 (*)    RBC 3.63 (*)  Hemoglobin 11.0 (*)    HCT 33.2 (*)    Neutro Abs 8.6 (*)    All other components within normal limits  BASIC METABOLIC PANEL WITH GFR - Abnormal; Notable for the following components:   Sodium 130 (*)    CO2 21 (*)    Glucose, Bld 104 (*)    Calcium  8.3 (*)    All other components within normal limits  SYNOVIAL CELL COUNT + DIFF, W/ CRYSTALS - Abnormal; Notable for the following components:   Appearance-Synovial CLOUDY (*)    WBC, Synovial 8,911 (*)    Neutrophil, Synovial 93 (*)    Monocyte-Macrophage-Synovial Fluid 3 (*)    All other components within normal limits  BODY FLUID CULTURE W GRAM STAIN  GLUCOSE, BODY FLUID OTHER            PROTEIN, BODY FLUID (OTHER)     EKG    RADIOLOGY X-ray left knee interpreted by me, unremarkable.  Radiology report reviewed   PROCEDURES:  .Joint Aspiration/Arthrocentesis: L knee  Date/Time: 02/14/2024 6:22 AM  Performed by: Viviann Pastor, MD Authorized by: Viviann Pastor, MD   Consent:    Consent obtained:  Verbal   Consent given by:  Patient   Risks,  benefits, and alternatives were discussed: yes     Risks discussed:  Bleeding, incomplete drainage, nerve damage, infection and pain   Alternatives discussed:  No treatment Universal protocol:    Patient identity confirmed:  Verbally with patient and arm band Location:    Location:  Knee   Knee:  L knee Anesthesia:    Anesthesia method:  Local infiltration   Local anesthetic:  5 mL lidocaine -EPINEPHrine  2 %-1:200000 Procedure details:    Preparation: Patient was prepped and draped in usual sterile fashion     Needle gauge:  18 G   Approach:  Lateral   Aspirate amount:  20   Aspirate characteristics:  Yellow   Specimen collected: yes   Post-procedure details:    Dressing:  Sterile dressing   Procedure completion:  Tolerated well, no immediate complications    MEDICATIONS ORDERED IN ED: Medications  lidocaine -EPINEPHrine  (XYLOCAINE  W/EPI) 2 %-1:100000 (with pres) injection 20 mL (20 mLs Infiltration Given 02/14/24 0531)  oxyCODONE -acetaminophen  (PERCOCET/ROXICET) 5-325 MG per tablet 1 tablet (1 tablet Oral Given 02/14/24 0601)     IMPRESSION / MDM / ASSESSMENT AND PLAN / ED COURSE  I reviewed the triage vital signs and the nursing notes.  DDx: Osteoarthritis, septic arthritis, hyponatremia.  Doubt DVT or fracture  Patient's presentation is most consistent with acute presentation with potential threat to life or bodily function.  Patient presents with increased pain and swelling of the left knee.  6 months ago had suspected septic arthritis the culture was reported as negative.  Patient and son agreeable to arthrocentesis which they have had before and there are familiar with the procedure.  Arthrocentesis performed, only 8000 white blood cells, normal white blood cell count, no fever, not consistent with septic arthritis.  Stable for discharge and follow-up with orthopedics.  Suspect osteoarthritis, worsened by recent hospitalization and period of immobility.  No signs of DVT.        FINAL CLINICAL IMPRESSION(S) / ED DIAGNOSES   Final diagnoses:  Left knee pain, unspecified chronicity     Rx / DC Orders   ED Discharge Orders     None        Note:  This document was prepared using Dragon voice recognition software and may include unintentional  dictation errors.   Viviann Pastor, MD 02/14/24 [id number]    Viviann Pastor, MD 02/14/24 [phone]  "

## 2024-02-14 NOTE — Telephone Encounter (Signed)
-----   Message from Harlene KATHEE Arenas, CMA sent at 02/09/2024 10:43 AM EST -----

## 2024-02-14 NOTE — ED Notes (Signed)
 Pt assisted to use bedpan

## 2024-02-14 NOTE — ED Notes (Addendum)
 SABRA

## 2024-02-14 NOTE — Telephone Encounter (Signed)
 No medication needed from me

## 2024-02-15 LAB — GLUCOSE, BODY FLUID OTHER: Glucose, Body Fluid Other: 9 mg/dL

## 2024-02-16 ENCOUNTER — Telehealth: Payer: Self-pay

## 2024-02-16 NOTE — Telephone Encounter (Signed)
 Tizanidine  last rx:  01/29/24, #30/0 refills- sig:  Take 1 tablet (2 mg total) by mouth at bedtime as needed for muscle spasms (by Dr Randeen) to [facility] [address]   Pantoprazole  last rx:  02/03/24, #30/0 refills- sig:  Take 1 tablet (40 mg total) by mouth daily (by [facility] ED) to [facility] [address]   Too soon for either rx to need refill.   Spoke with pt's daughter, Ulla (on dpr), relaying info above and asking if pt is taking meds as prescribed. If so, too soon for refills. States she will check with pt and call back to let us  know.

## 2024-02-16 NOTE — Telephone Encounter (Signed)
 Copied from CRM #[id number]. Topic: Clinical - Medication Refill >> Feb 16, 2024 11:22 AM Montie POUR wrote: Medication:  pantoprazole  (PROTONIX ) 40 MG tablet  tiZANidine  (ZANAFLEX ) 2 MG tablet  Has the patient contacted their pharmacy? Yes (Agent: If no, request that the patient contact the pharmacy for the refill. If patient does not wish to contact the pharmacy document the reason why and proceed with request.) (Agent: If yes, when and what did the pharmacy advise?) Pharmacy needs order to refill  This is the patient's preferred pharmacy:  [id number] - [place] ([place]), [place] - [address] [place] ([place]) [place] [address] Phone: [phone] Fax: [phone]  Is this the correct pharmacy for this prescription? Yes If no, delete pharmacy and type the correct one.   Has the prescription been filled recently? No  Is the patient out of the medication? Yes  Has the patient been seen for an appointment in the last year OR does the patient have an upcoming appointment? Yes  Can we respond through MyChart? No  Agent: Please be advised that Rx refills may take up to 3 business days. We ask that you follow-up with your pharmacy. >> Feb 16, 2024 11:25 AM Montie POUR wrote: Patient daughter Ulla wants to pick medication up before bad weather hits

## 2024-02-16 NOTE — Telephone Encounter (Signed)
 Duplicate/disregard

## 2024-02-16 NOTE — Telephone Encounter (Deleted)
 Copied from CRM #[id number]. Topic: Clinical - Medication Refill >> Feb 16, 2024 11:22 AM Montie POUR wrote: Medication:  pantoprazole  (PROTONIX ) 40 MG tablet  tiZANidine  (ZANAFLEX ) 2 MG tablet  Has the patient contacted their pharmacy? Yes (Agent: If no, request that the patient contact the pharmacy for the refill. If patient does not wish to contact the pharmacy document the reason why and proceed with request.) (Agent: If yes, when and what did the pharmacy advise?) Pharmacy needs order to refill  This is the patient's preferred pharmacy:  [id number] - [place] ([place]), [place] - [address] [place] ([place]) [place] [address] Phone: [phone] Fax: [phone]  Is this the correct pharmacy for this prescription? Yes If no, delete pharmacy and type the correct one.   Has the prescription been filled recently? No  Is the patient out of the medication? Yes  Has the patient been seen for an appointment in the last year OR does the patient have an upcoming appointment? Yes  Can we respond through MyChart? No  Agent: Please be advised that Rx refills may take up to 3 business days. We ask that you follow-up with your pharmacy. >> Feb 16, 2024 11:25 AM Montie POUR wrote: Patient daughter Ulla wants to pick medication up before bad weather hits

## 2024-02-18 ENCOUNTER — Other Ambulatory Visit: Payer: Self-pay | Admitting: Nurse Practitioner

## 2024-02-18 DIAGNOSIS — M6289 Other specified disorders of muscle: Secondary | ICD-10-CM

## 2024-02-18 LAB — BODY FLUID CULTURE W GRAM STAIN: Culture: NO GROWTH

## 2024-02-18 LAB — PROTEIN, BODY FLUID (OTHER): Total Protein, Body Fluid Other: 4.2 g/dL

## 2024-02-20 ENCOUNTER — Ambulatory Visit: Admitting: Nurse Practitioner

## 2024-02-20 ENCOUNTER — Telehealth: Payer: Self-pay

## 2024-02-20 VITALS — BP 132/72 | HR 61 | Temp 98.7°F | Ht 60.0 in

## 2024-02-20 DIAGNOSIS — M2550 Pain in unspecified joint: Secondary | ICD-10-CM

## 2024-02-20 DIAGNOSIS — M6289 Other specified disorders of muscle: Secondary | ICD-10-CM

## 2024-02-20 DIAGNOSIS — I10 Essential (primary) hypertension: Secondary | ICD-10-CM

## 2024-02-20 DIAGNOSIS — R600 Localized edema: Secondary | ICD-10-CM | POA: Diagnosis not present

## 2024-02-20 LAB — CBC
HCT: 32 % — ABNORMAL LOW (ref 36.0–46.0)
Hemoglobin: 11.1 g/dL — ABNORMAL LOW (ref 12.0–15.0)
MCHC: 34.6 g/dL (ref 30.0–36.0)
MCV: 89.7 fl (ref 78.0–100.0)
Platelets: 402 10*3/uL — ABNORMAL HIGH (ref 150.0–400.0)
RBC: 3.57 Mil/uL — ABNORMAL LOW (ref 3.87–5.11)
RDW: 13.9 % (ref 11.5–15.5)
WBC: 10.2 10*3/uL (ref 4.0–10.5)

## 2024-02-20 LAB — BASIC METABOLIC PANEL WITH GFR
BUN: 12 mg/dL (ref 6–23)
CO2: 24 meq/L (ref 19–32)
Calcium: 8.2 mg/dL — ABNORMAL LOW (ref 8.4–10.5)
Chloride: 92 meq/L — ABNORMAL LOW (ref 96–112)
Creatinine, Ser: 0.6 mg/dL (ref 0.40–1.20)
GFR: 86.71 mL/min
Glucose, Bld: 82 mg/dL (ref 70–99)
Potassium: 4.3 meq/L (ref 3.5–5.1)
Sodium: 123 meq/L — ABNORMAL LOW (ref 135–145)

## 2024-02-20 LAB — BRAIN NATRIURETIC PEPTIDE: Pro B Natriuretic peptide (BNP): 100 pg/mL (ref 1.0–100.0)

## 2024-02-20 MED ORDER — TRAMADOL HCL 50 MG PO TABS: 50.0000 mg | ORAL_TABLET | Freq: Three times a day (TID) | ORAL | 0 refills | Status: AC | PRN

## 2024-02-20 MED ORDER — LOSARTAN POTASSIUM 25 MG PO TABS: 25.0000 mg | ORAL_TABLET | Freq: Every day | ORAL | 1 refills | Status: AC

## 2024-02-20 MED ORDER — ATENOLOL 50 MG PO TABS: 50.0000 mg | ORAL_TABLET | Freq: Every day | ORAL | 1 refills | Status: AC

## 2024-02-20 MED ORDER — ACETAMINOPHEN 500 MG PO TABS: 500.0000 mg | ORAL_TABLET | Freq: Four times a day (QID) | ORAL | 0 refills | Status: AC | PRN

## 2024-02-20 MED ORDER — PANTOPRAZOLE SODIUM 40 MG PO TBEC: 40.0000 mg | DELAYED_RELEASE_TABLET | Freq: Every day | ORAL | 0 refills | Status: AC

## 2024-02-20 MED ORDER — TIZANIDINE HCL 2 MG PO TABS: ORAL_TABLET | ORAL | 0 refills | Status: AC

## 2024-02-20 NOTE — Telephone Encounter (Signed)
 Tramadol  sent over

## 2024-02-20 NOTE — Telephone Encounter (Signed)
 Copied from CRM #[id number]. Topic: Clinical - Prescription Issue >> Feb 20, 2024 12:48 PM Macario HERO wrote: Reason for CRM: Patient son called said provider forgot to send TRAMADOL  to the pharmacy. Requesting a callback when sent over.   I didn't see where it was sent to her pharmacy; does this need to be sent in for the patient?

## 2024-02-20 NOTE — Telephone Encounter (Signed)
 Left voicemail for patient to call the office back if any concerns. Sending fpl group.

## 2024-02-21 ENCOUNTER — Ambulatory Visit: Payer: Self-pay | Admitting: Nurse Practitioner

## 2024-02-21 ENCOUNTER — Other Ambulatory Visit
Admission: RE | Admit: 2024-02-21 | Discharge: 2024-02-21 | Disposition: A | Discharge status: Home / Self Care | Source: Ambulatory Visit | Attending: Orthopedic Surgery | Admitting: Orthopedic Surgery

## 2024-02-21 DIAGNOSIS — E871 Hypo-osmolality and hyponatremia: Secondary | ICD-10-CM

## 2024-02-21 LAB — SYNOVIAL CELL COUNT + DIFF, W/ CRYSTALS
Crystals, Fluid: NONE SEEN
Eosinophils-Synovial: 0 %
Lymphocytes-Synovial Fld: 2 %
Monocyte-Macrophage-Synovial Fluid: 4 %
Neutrophil, Synovial: 94 %
WBC, Synovial: 19990 /mm3 — ABNORMAL HIGH (ref 0–200)

## 2024-02-23 ENCOUNTER — Other Ambulatory Visit

## 2024-02-23 DIAGNOSIS — E871 Hypo-osmolality and hyponatremia: Secondary | ICD-10-CM

## 2024-02-23 LAB — BODY FLUID CULTURE W GRAM STAIN: Culture: NO GROWTH

## 2024-02-23 LAB — SODIUM: Sodium: 125 meq/L — ABNORMAL LOW (ref 135–145)

## 2024-10-04 ENCOUNTER — Ambulatory Visit
